# Patient Record
Sex: Male | Born: 1935 | Race: White | Hispanic: No | Marital: Married | Smoking: Never smoker
Health system: Southern US, Community
[De-identification: ages and names within clinical notes are randomized; demographics above are authoritative.]

## PROBLEM LIST (undated history)

## (undated) ENCOUNTER — Emergency Department (HOSPITAL_COMMUNITY): Payer: Medicare Other | Source: Home / Self Care

## (undated) DIAGNOSIS — F32A Depression, unspecified: Secondary | ICD-10-CM

## (undated) DIAGNOSIS — H269 Unspecified cataract: Secondary | ICD-10-CM

## (undated) DIAGNOSIS — G709 Myoneural disorder, unspecified: Secondary | ICD-10-CM

## (undated) DIAGNOSIS — R238 Other skin changes: Secondary | ICD-10-CM

## (undated) DIAGNOSIS — K219 Gastro-esophageal reflux disease without esophagitis: Secondary | ICD-10-CM

## (undated) DIAGNOSIS — I639 Cerebral infarction, unspecified: Secondary | ICD-10-CM

## (undated) DIAGNOSIS — F329 Major depressive disorder, single episode, unspecified: Secondary | ICD-10-CM

## (undated) DIAGNOSIS — E119 Type 2 diabetes mellitus without complications: Secondary | ICD-10-CM

## (undated) DIAGNOSIS — R0902 Hypoxemia: Secondary | ICD-10-CM

## (undated) DIAGNOSIS — I10 Essential (primary) hypertension: Secondary | ICD-10-CM

## (undated) DIAGNOSIS — M1990 Unspecified osteoarthritis, unspecified site: Secondary | ICD-10-CM

## (undated) DIAGNOSIS — R233 Spontaneous ecchymoses: Secondary | ICD-10-CM

## (undated) DIAGNOSIS — F419 Anxiety disorder, unspecified: Secondary | ICD-10-CM

## (undated) HISTORY — DX: Unspecified osteoarthritis, unspecified site: M19.90

## (undated) HISTORY — DX: Anxiety disorder, unspecified: F41.9

## (undated) HISTORY — DX: Cerebral infarction, unspecified: I63.9

## (undated) HISTORY — DX: Unspecified cataract: H26.9

## (undated) HISTORY — PX: TONSILLECTOMY: SUR1361

## (undated) HISTORY — DX: Spontaneous ecchymoses: R23.3

## (undated) HISTORY — PX: CHOLECYSTECTOMY: SHX55

## (undated) HISTORY — DX: Gastro-esophageal reflux disease without esophagitis: K21.9

## (undated) HISTORY — DX: Hypoxemia: R09.02

## (undated) HISTORY — DX: Myoneural disorder, unspecified: G70.9

## (undated) HISTORY — DX: Essential (primary) hypertension: I10

## (undated) HISTORY — DX: Type 2 diabetes mellitus without complications: E11.9

## (undated) HISTORY — DX: Other skin changes: R23.8

## (undated) HISTORY — DX: Depression, unspecified: F32.A

## (undated) HISTORY — DX: Major depressive disorder, single episode, unspecified: F32.9

---

## 2010-10-08 DIAGNOSIS — IMO0002 Reserved for concepts with insufficient information to code with codable children: Secondary | ICD-10-CM | POA: Insufficient documentation

## 2011-02-04 DIAGNOSIS — M19019 Primary osteoarthritis, unspecified shoulder: Secondary | ICD-10-CM | POA: Diagnosis not present

## 2011-02-04 DIAGNOSIS — E119 Type 2 diabetes mellitus without complications: Secondary | ICD-10-CM | POA: Diagnosis not present

## 2011-02-04 DIAGNOSIS — L259 Unspecified contact dermatitis, unspecified cause: Secondary | ICD-10-CM | POA: Diagnosis not present

## 2011-02-04 DIAGNOSIS — E785 Hyperlipidemia, unspecified: Secondary | ICD-10-CM | POA: Diagnosis not present

## 2011-02-04 DIAGNOSIS — I10 Essential (primary) hypertension: Secondary | ICD-10-CM | POA: Diagnosis not present

## 2011-02-18 DIAGNOSIS — M25819 Other specified joint disorders, unspecified shoulder: Secondary | ICD-10-CM | POA: Diagnosis not present

## 2011-03-03 DIAGNOSIS — Z1211 Encounter for screening for malignant neoplasm of colon: Secondary | ICD-10-CM | POA: Diagnosis not present

## 2011-04-01 DIAGNOSIS — Z1211 Encounter for screening for malignant neoplasm of colon: Secondary | ICD-10-CM | POA: Diagnosis not present

## 2011-04-01 DIAGNOSIS — M19019 Primary osteoarthritis, unspecified shoulder: Secondary | ICD-10-CM | POA: Diagnosis not present

## 2011-04-01 DIAGNOSIS — Z7982 Long term (current) use of aspirin: Secondary | ICD-10-CM | POA: Diagnosis not present

## 2011-04-01 DIAGNOSIS — E119 Type 2 diabetes mellitus without complications: Secondary | ICD-10-CM | POA: Diagnosis not present

## 2011-04-01 DIAGNOSIS — I10 Essential (primary) hypertension: Secondary | ICD-10-CM | POA: Diagnosis not present

## 2011-04-01 DIAGNOSIS — D126 Benign neoplasm of colon, unspecified: Secondary | ICD-10-CM | POA: Diagnosis not present

## 2011-04-01 DIAGNOSIS — M1990 Unspecified osteoarthritis, unspecified site: Secondary | ICD-10-CM | POA: Diagnosis not present

## 2011-04-01 DIAGNOSIS — E7800 Pure hypercholesterolemia, unspecified: Secondary | ICD-10-CM | POA: Diagnosis not present

## 2011-04-01 DIAGNOSIS — F411 Generalized anxiety disorder: Secondary | ICD-10-CM | POA: Diagnosis not present

## 2011-04-01 DIAGNOSIS — Z79899 Other long term (current) drug therapy: Secondary | ICD-10-CM | POA: Diagnosis not present

## 2011-04-01 DIAGNOSIS — K648 Other hemorrhoids: Secondary | ICD-10-CM | POA: Diagnosis not present

## 2011-04-01 DIAGNOSIS — K5730 Diverticulosis of large intestine without perforation or abscess without bleeding: Secondary | ICD-10-CM | POA: Diagnosis not present

## 2011-04-01 DIAGNOSIS — K219 Gastro-esophageal reflux disease without esophagitis: Secondary | ICD-10-CM | POA: Diagnosis not present

## 2011-04-08 DIAGNOSIS — M7530 Calcific tendinitis of unspecified shoulder: Secondary | ICD-10-CM | POA: Diagnosis not present

## 2011-04-08 DIAGNOSIS — M67919 Unspecified disorder of synovium and tendon, unspecified shoulder: Secondary | ICD-10-CM | POA: Diagnosis not present

## 2011-04-15 DIAGNOSIS — L820 Inflamed seborrheic keratosis: Secondary | ICD-10-CM | POA: Diagnosis not present

## 2011-05-02 DIAGNOSIS — J209 Acute bronchitis, unspecified: Secondary | ICD-10-CM | POA: Diagnosis not present

## 2011-05-04 DIAGNOSIS — R634 Abnormal weight loss: Secondary | ICD-10-CM | POA: Diagnosis not present

## 2011-05-04 DIAGNOSIS — E119 Type 2 diabetes mellitus without complications: Secondary | ICD-10-CM | POA: Diagnosis not present

## 2011-05-04 DIAGNOSIS — L259 Unspecified contact dermatitis, unspecified cause: Secondary | ICD-10-CM | POA: Diagnosis not present

## 2011-05-04 DIAGNOSIS — F411 Generalized anxiety disorder: Secondary | ICD-10-CM | POA: Diagnosis not present

## 2011-05-04 DIAGNOSIS — K589 Irritable bowel syndrome without diarrhea: Secondary | ICD-10-CM | POA: Diagnosis not present

## 2011-05-20 DIAGNOSIS — F411 Generalized anxiety disorder: Secondary | ICD-10-CM | POA: Diagnosis not present

## 2011-05-20 DIAGNOSIS — R634 Abnormal weight loss: Secondary | ICD-10-CM | POA: Diagnosis not present

## 2011-05-20 DIAGNOSIS — F329 Major depressive disorder, single episode, unspecified: Secondary | ICD-10-CM | POA: Diagnosis not present

## 2011-05-20 DIAGNOSIS — E119 Type 2 diabetes mellitus without complications: Secondary | ICD-10-CM | POA: Diagnosis not present

## 2011-05-27 DIAGNOSIS — E11329 Type 2 diabetes mellitus with mild nonproliferative diabetic retinopathy without macular edema: Secondary | ICD-10-CM | POA: Diagnosis not present

## 2011-05-27 DIAGNOSIS — E119 Type 2 diabetes mellitus without complications: Secondary | ICD-10-CM | POA: Diagnosis not present

## 2011-06-24 DIAGNOSIS — IMO0002 Reserved for concepts with insufficient information to code with codable children: Secondary | ICD-10-CM | POA: Diagnosis not present

## 2011-06-24 DIAGNOSIS — E119 Type 2 diabetes mellitus without complications: Secondary | ICD-10-CM | POA: Diagnosis not present

## 2011-06-24 DIAGNOSIS — R634 Abnormal weight loss: Secondary | ICD-10-CM | POA: Diagnosis not present

## 2011-06-24 DIAGNOSIS — F411 Generalized anxiety disorder: Secondary | ICD-10-CM | POA: Diagnosis not present

## 2011-08-27 DIAGNOSIS — IMO0002 Reserved for concepts with insufficient information to code with codable children: Secondary | ICD-10-CM | POA: Diagnosis not present

## 2011-08-27 DIAGNOSIS — L259 Unspecified contact dermatitis, unspecified cause: Secondary | ICD-10-CM | POA: Diagnosis not present

## 2011-09-29 DIAGNOSIS — M545 Low back pain: Secondary | ICD-10-CM | POA: Diagnosis not present

## 2011-09-29 DIAGNOSIS — M999 Biomechanical lesion, unspecified: Secondary | ICD-10-CM | POA: Diagnosis not present

## 2011-09-29 DIAGNOSIS — IMO0002 Reserved for concepts with insufficient information to code with codable children: Secondary | ICD-10-CM | POA: Diagnosis not present

## 2011-10-01 DIAGNOSIS — M999 Biomechanical lesion, unspecified: Secondary | ICD-10-CM | POA: Diagnosis not present

## 2011-10-01 DIAGNOSIS — M545 Low back pain: Secondary | ICD-10-CM | POA: Diagnosis not present

## 2011-10-01 DIAGNOSIS — IMO0002 Reserved for concepts with insufficient information to code with codable children: Secondary | ICD-10-CM | POA: Diagnosis not present

## 2011-10-28 DIAGNOSIS — E119 Type 2 diabetes mellitus without complications: Secondary | ICD-10-CM | POA: Diagnosis not present

## 2011-10-28 DIAGNOSIS — I10 Essential (primary) hypertension: Secondary | ICD-10-CM | POA: Diagnosis not present

## 2011-10-28 DIAGNOSIS — F411 Generalized anxiety disorder: Secondary | ICD-10-CM | POA: Diagnosis not present

## 2011-10-28 DIAGNOSIS — Z23 Encounter for immunization: Secondary | ICD-10-CM | POA: Diagnosis not present

## 2011-10-28 DIAGNOSIS — Z125 Encounter for screening for malignant neoplasm of prostate: Secondary | ICD-10-CM | POA: Diagnosis not present

## 2011-10-28 DIAGNOSIS — E785 Hyperlipidemia, unspecified: Secondary | ICD-10-CM | POA: Diagnosis not present

## 2011-11-18 DIAGNOSIS — M2040 Other hammer toe(s) (acquired), unspecified foot: Secondary | ICD-10-CM | POA: Diagnosis not present

## 2011-11-18 DIAGNOSIS — E1149 Type 2 diabetes mellitus with other diabetic neurological complication: Secondary | ICD-10-CM | POA: Diagnosis not present

## 2011-11-25 DIAGNOSIS — N289 Disorder of kidney and ureter, unspecified: Secondary | ICD-10-CM | POA: Diagnosis not present

## 2011-11-25 DIAGNOSIS — D649 Anemia, unspecified: Secondary | ICD-10-CM | POA: Diagnosis not present

## 2011-12-23 DIAGNOSIS — L259 Unspecified contact dermatitis, unspecified cause: Secondary | ICD-10-CM | POA: Diagnosis not present

## 2011-12-23 DIAGNOSIS — L570 Actinic keratosis: Secondary | ICD-10-CM | POA: Diagnosis not present

## 2012-01-06 DIAGNOSIS — L259 Unspecified contact dermatitis, unspecified cause: Secondary | ICD-10-CM | POA: Diagnosis not present

## 2012-01-13 DIAGNOSIS — R5381 Other malaise: Secondary | ICD-10-CM | POA: Diagnosis not present

## 2012-01-18 DIAGNOSIS — A419 Sepsis, unspecified organism: Secondary | ICD-10-CM | POA: Diagnosis not present

## 2012-01-18 DIAGNOSIS — R404 Transient alteration of awareness: Secondary | ICD-10-CM | POA: Diagnosis not present

## 2012-01-18 DIAGNOSIS — B9689 Other specified bacterial agents as the cause of diseases classified elsewhere: Secondary | ICD-10-CM | POA: Diagnosis not present

## 2012-01-18 DIAGNOSIS — R509 Fever, unspecified: Secondary | ICD-10-CM | POA: Diagnosis not present

## 2012-01-18 DIAGNOSIS — N390 Urinary tract infection, site not specified: Secondary | ICD-10-CM | POA: Diagnosis not present

## 2012-01-18 DIAGNOSIS — A499 Bacterial infection, unspecified: Secondary | ICD-10-CM | POA: Diagnosis not present

## 2012-01-19 DIAGNOSIS — A499 Bacterial infection, unspecified: Secondary | ICD-10-CM | POA: Diagnosis not present

## 2012-01-19 DIAGNOSIS — Z79899 Other long term (current) drug therapy: Secondary | ICD-10-CM | POA: Diagnosis not present

## 2012-01-19 DIAGNOSIS — R578 Other shock: Secondary | ICD-10-CM | POA: Diagnosis not present

## 2012-01-19 DIAGNOSIS — E872 Acidosis: Secondary | ICD-10-CM | POA: Diagnosis not present

## 2012-01-19 DIAGNOSIS — R300 Dysuria: Secondary | ICD-10-CM | POA: Diagnosis not present

## 2012-01-19 DIAGNOSIS — R6510 Systemic inflammatory response syndrome (SIRS) of non-infectious origin without acute organ dysfunction: Secondary | ICD-10-CM | POA: Diagnosis not present

## 2012-01-19 DIAGNOSIS — R404 Transient alteration of awareness: Secondary | ICD-10-CM | POA: Diagnosis not present

## 2012-01-19 DIAGNOSIS — IMO0001 Reserved for inherently not codable concepts without codable children: Secondary | ICD-10-CM | POA: Diagnosis not present

## 2012-01-19 DIAGNOSIS — N390 Urinary tract infection, site not specified: Secondary | ICD-10-CM | POA: Diagnosis not present

## 2012-01-19 DIAGNOSIS — K219 Gastro-esophageal reflux disease without esophagitis: Secondary | ICD-10-CM | POA: Diagnosis present

## 2012-01-19 DIAGNOSIS — D6489 Other specified anemias: Secondary | ICD-10-CM | POA: Diagnosis present

## 2012-01-19 DIAGNOSIS — E785 Hyperlipidemia, unspecified: Secondary | ICD-10-CM | POA: Diagnosis present

## 2012-01-19 DIAGNOSIS — A419 Sepsis, unspecified organism: Secondary | ICD-10-CM | POA: Diagnosis present

## 2012-01-19 DIAGNOSIS — F411 Generalized anxiety disorder: Secondary | ICD-10-CM | POA: Diagnosis present

## 2012-01-19 DIAGNOSIS — R509 Fever, unspecified: Secondary | ICD-10-CM | POA: Diagnosis not present

## 2012-01-19 DIAGNOSIS — N179 Acute kidney failure, unspecified: Secondary | ICD-10-CM | POA: Diagnosis not present

## 2012-01-19 DIAGNOSIS — I129 Hypertensive chronic kidney disease with stage 1 through stage 4 chronic kidney disease, or unspecified chronic kidney disease: Secondary | ICD-10-CM | POA: Diagnosis present

## 2012-01-21 DIAGNOSIS — IMO0002 Reserved for concepts with insufficient information to code with codable children: Secondary | ICD-10-CM | POA: Diagnosis not present

## 2012-01-21 DIAGNOSIS — J189 Pneumonia, unspecified organism: Secondary | ICD-10-CM | POA: Diagnosis not present

## 2012-01-21 DIAGNOSIS — I501 Left ventricular failure: Secondary | ICD-10-CM | POA: Diagnosis not present

## 2012-01-21 DIAGNOSIS — D500 Iron deficiency anemia secondary to blood loss (chronic): Secondary | ICD-10-CM | POA: Diagnosis present

## 2012-01-21 DIAGNOSIS — K2960 Other gastritis without bleeding: Secondary | ICD-10-CM | POA: Diagnosis present

## 2012-01-21 DIAGNOSIS — K274 Chronic or unspecified peptic ulcer, site unspecified, with hemorrhage: Secondary | ICD-10-CM | POA: Diagnosis not present

## 2012-01-21 DIAGNOSIS — E119 Type 2 diabetes mellitus without complications: Secondary | ICD-10-CM | POA: Diagnosis present

## 2012-01-21 DIAGNOSIS — K922 Gastrointestinal hemorrhage, unspecified: Secondary | ICD-10-CM | POA: Diagnosis not present

## 2012-01-21 DIAGNOSIS — R509 Fever, unspecified: Secondary | ICD-10-CM | POA: Diagnosis not present

## 2012-01-21 DIAGNOSIS — Z79899 Other long term (current) drug therapy: Secondary | ICD-10-CM | POA: Diagnosis not present

## 2012-01-21 DIAGNOSIS — B3781 Candidal esophagitis: Secondary | ICD-10-CM | POA: Diagnosis not present

## 2012-01-21 DIAGNOSIS — R011 Cardiac murmur, unspecified: Secondary | ICD-10-CM | POA: Diagnosis not present

## 2012-01-21 DIAGNOSIS — I2789 Other specified pulmonary heart diseases: Secondary | ICD-10-CM | POA: Diagnosis not present

## 2012-01-21 DIAGNOSIS — R0602 Shortness of breath: Secondary | ICD-10-CM | POA: Diagnosis not present

## 2012-01-21 DIAGNOSIS — R748 Abnormal levels of other serum enzymes: Secondary | ICD-10-CM | POA: Diagnosis not present

## 2012-01-21 DIAGNOSIS — E860 Dehydration: Secondary | ICD-10-CM | POA: Diagnosis not present

## 2012-01-21 DIAGNOSIS — E872 Acidosis, unspecified: Secondary | ICD-10-CM | POA: Diagnosis not present

## 2012-01-21 DIAGNOSIS — R9431 Abnormal electrocardiogram [ECG] [EKG]: Secondary | ICD-10-CM | POA: Diagnosis not present

## 2012-01-21 DIAGNOSIS — N179 Acute kidney failure, unspecified: Secondary | ICD-10-CM | POA: Diagnosis not present

## 2012-01-21 DIAGNOSIS — E785 Hyperlipidemia, unspecified: Secondary | ICD-10-CM | POA: Diagnosis present

## 2012-01-21 DIAGNOSIS — D1330 Benign neoplasm of unspecified part of small intestine: Secondary | ICD-10-CM | POA: Diagnosis not present

## 2012-01-21 DIAGNOSIS — D649 Anemia, unspecified: Secondary | ICD-10-CM | POA: Diagnosis not present

## 2012-01-21 DIAGNOSIS — R0789 Other chest pain: Secondary | ICD-10-CM | POA: Diagnosis not present

## 2012-01-21 DIAGNOSIS — R0902 Hypoxemia: Secondary | ICD-10-CM | POA: Diagnosis not present

## 2012-01-21 DIAGNOSIS — D62 Acute posthemorrhagic anemia: Secondary | ICD-10-CM | POA: Diagnosis not present

## 2012-01-21 DIAGNOSIS — Z7982 Long term (current) use of aspirin: Secondary | ICD-10-CM | POA: Diagnosis not present

## 2012-01-21 DIAGNOSIS — A419 Sepsis, unspecified organism: Secondary | ICD-10-CM | POA: Diagnosis not present

## 2012-01-21 DIAGNOSIS — J984 Other disorders of lung: Secondary | ICD-10-CM | POA: Diagnosis not present

## 2012-01-21 DIAGNOSIS — R6510 Systemic inflammatory response syndrome (SIRS) of non-infectious origin without acute organ dysfunction: Secondary | ICD-10-CM | POA: Diagnosis not present

## 2012-01-21 DIAGNOSIS — R5381 Other malaise: Secondary | ICD-10-CM | POA: Diagnosis not present

## 2012-01-21 DIAGNOSIS — J9600 Acute respiratory failure, unspecified whether with hypoxia or hypercapnia: Secondary | ICD-10-CM | POA: Diagnosis not present

## 2012-01-21 DIAGNOSIS — F411 Generalized anxiety disorder: Secondary | ICD-10-CM | POA: Diagnosis present

## 2012-01-21 DIAGNOSIS — I10 Essential (primary) hypertension: Secondary | ICD-10-CM | POA: Diagnosis present

## 2012-01-21 DIAGNOSIS — K219 Gastro-esophageal reflux disease without esophagitis: Secondary | ICD-10-CM | POA: Diagnosis not present

## 2012-01-21 DIAGNOSIS — I509 Heart failure, unspecified: Secondary | ICD-10-CM | POA: Diagnosis present

## 2012-01-21 DIAGNOSIS — I5030 Unspecified diastolic (congestive) heart failure: Secondary | ICD-10-CM | POA: Diagnosis not present

## 2012-02-04 DIAGNOSIS — Z8719 Personal history of other diseases of the digestive system: Secondary | ICD-10-CM | POA: Diagnosis not present

## 2012-02-04 DIAGNOSIS — Z09 Encounter for follow-up examination after completed treatment for conditions other than malignant neoplasm: Secondary | ICD-10-CM | POA: Diagnosis not present

## 2012-02-08 DIAGNOSIS — I10 Essential (primary) hypertension: Secondary | ICD-10-CM | POA: Diagnosis not present

## 2012-02-08 DIAGNOSIS — E119 Type 2 diabetes mellitus without complications: Secondary | ICD-10-CM | POA: Diagnosis not present

## 2012-02-08 DIAGNOSIS — R7989 Other specified abnormal findings of blood chemistry: Secondary | ICD-10-CM | POA: Diagnosis not present

## 2012-02-08 DIAGNOSIS — E785 Hyperlipidemia, unspecified: Secondary | ICD-10-CM | POA: Diagnosis not present

## 2012-02-09 DIAGNOSIS — B3781 Candidal esophagitis: Secondary | ICD-10-CM | POA: Diagnosis not present

## 2012-02-09 DIAGNOSIS — K219 Gastro-esophageal reflux disease without esophagitis: Secondary | ICD-10-CM | POA: Diagnosis not present

## 2012-03-03 DIAGNOSIS — L259 Unspecified contact dermatitis, unspecified cause: Secondary | ICD-10-CM | POA: Diagnosis not present

## 2012-03-23 DIAGNOSIS — L259 Unspecified contact dermatitis, unspecified cause: Secondary | ICD-10-CM | POA: Diagnosis not present

## 2012-03-23 DIAGNOSIS — L981 Factitial dermatitis: Secondary | ICD-10-CM | POA: Diagnosis not present

## 2012-04-20 DIAGNOSIS — E119 Type 2 diabetes mellitus without complications: Secondary | ICD-10-CM | POA: Diagnosis not present

## 2012-04-20 DIAGNOSIS — I10 Essential (primary) hypertension: Secondary | ICD-10-CM | POA: Diagnosis not present

## 2012-04-20 DIAGNOSIS — R7989 Other specified abnormal findings of blood chemistry: Secondary | ICD-10-CM | POA: Diagnosis not present

## 2012-04-20 DIAGNOSIS — E785 Hyperlipidemia, unspecified: Secondary | ICD-10-CM | POA: Diagnosis not present

## 2012-05-04 DIAGNOSIS — Z1331 Encounter for screening for depression: Secondary | ICD-10-CM | POA: Diagnosis not present

## 2012-05-04 DIAGNOSIS — E785 Hyperlipidemia, unspecified: Secondary | ICD-10-CM | POA: Diagnosis not present

## 2012-05-04 DIAGNOSIS — E119 Type 2 diabetes mellitus without complications: Secondary | ICD-10-CM | POA: Diagnosis not present

## 2012-05-04 DIAGNOSIS — Z9181 History of falling: Secondary | ICD-10-CM | POA: Diagnosis not present

## 2012-05-04 DIAGNOSIS — IMO0002 Reserved for concepts with insufficient information to code with codable children: Secondary | ICD-10-CM | POA: Diagnosis not present

## 2012-05-04 DIAGNOSIS — I10 Essential (primary) hypertension: Secondary | ICD-10-CM | POA: Diagnosis not present

## 2012-05-18 DIAGNOSIS — L259 Unspecified contact dermatitis, unspecified cause: Secondary | ICD-10-CM | POA: Diagnosis not present

## 2012-05-18 DIAGNOSIS — L508 Other urticaria: Secondary | ICD-10-CM | POA: Diagnosis not present

## 2012-06-08 DIAGNOSIS — J309 Allergic rhinitis, unspecified: Secondary | ICD-10-CM | POA: Diagnosis not present

## 2012-06-08 DIAGNOSIS — M545 Low back pain: Secondary | ICD-10-CM | POA: Diagnosis not present

## 2012-06-08 DIAGNOSIS — E119 Type 2 diabetes mellitus without complications: Secondary | ICD-10-CM | POA: Diagnosis not present

## 2012-06-08 DIAGNOSIS — L2089 Other atopic dermatitis: Secondary | ICD-10-CM | POA: Diagnosis not present

## 2012-06-08 DIAGNOSIS — IMO0002 Reserved for concepts with insufficient information to code with codable children: Secondary | ICD-10-CM | POA: Diagnosis not present

## 2012-06-08 DIAGNOSIS — M999 Biomechanical lesion, unspecified: Secondary | ICD-10-CM | POA: Diagnosis not present

## 2012-06-09 DIAGNOSIS — M999 Biomechanical lesion, unspecified: Secondary | ICD-10-CM | POA: Diagnosis not present

## 2012-06-09 DIAGNOSIS — M545 Low back pain: Secondary | ICD-10-CM | POA: Diagnosis not present

## 2012-06-09 DIAGNOSIS — IMO0002 Reserved for concepts with insufficient information to code with codable children: Secondary | ICD-10-CM | POA: Diagnosis not present

## 2012-06-10 DIAGNOSIS — M545 Low back pain: Secondary | ICD-10-CM | POA: Diagnosis not present

## 2012-06-10 DIAGNOSIS — IMO0002 Reserved for concepts with insufficient information to code with codable children: Secondary | ICD-10-CM | POA: Diagnosis not present

## 2012-06-10 DIAGNOSIS — M999 Biomechanical lesion, unspecified: Secondary | ICD-10-CM | POA: Diagnosis not present

## 2012-06-13 DIAGNOSIS — M999 Biomechanical lesion, unspecified: Secondary | ICD-10-CM | POA: Diagnosis not present

## 2012-06-13 DIAGNOSIS — M545 Low back pain: Secondary | ICD-10-CM | POA: Diagnosis not present

## 2012-06-13 DIAGNOSIS — IMO0002 Reserved for concepts with insufficient information to code with codable children: Secondary | ICD-10-CM | POA: Diagnosis not present

## 2012-06-16 DIAGNOSIS — M545 Low back pain: Secondary | ICD-10-CM | POA: Diagnosis not present

## 2012-06-16 DIAGNOSIS — Z79899 Other long term (current) drug therapy: Secondary | ICD-10-CM | POA: Diagnosis not present

## 2012-06-21 DIAGNOSIS — M545 Low back pain: Secondary | ICD-10-CM | POA: Diagnosis not present

## 2012-06-21 DIAGNOSIS — IMO0002 Reserved for concepts with insufficient information to code with codable children: Secondary | ICD-10-CM | POA: Diagnosis not present

## 2012-06-21 DIAGNOSIS — M999 Biomechanical lesion, unspecified: Secondary | ICD-10-CM | POA: Diagnosis not present

## 2012-06-29 DIAGNOSIS — IMO0002 Reserved for concepts with insufficient information to code with codable children: Secondary | ICD-10-CM | POA: Diagnosis not present

## 2012-06-29 DIAGNOSIS — M545 Low back pain: Secondary | ICD-10-CM | POA: Diagnosis not present

## 2012-06-29 DIAGNOSIS — M999 Biomechanical lesion, unspecified: Secondary | ICD-10-CM | POA: Diagnosis not present

## 2012-06-29 DIAGNOSIS — J45909 Unspecified asthma, uncomplicated: Secondary | ICD-10-CM | POA: Diagnosis not present

## 2012-06-30 DIAGNOSIS — J45909 Unspecified asthma, uncomplicated: Secondary | ICD-10-CM | POA: Diagnosis not present

## 2012-07-08 DIAGNOSIS — M5136 Other intervertebral disc degeneration, lumbar region: Secondary | ICD-10-CM | POA: Insufficient documentation

## 2012-07-08 DIAGNOSIS — M51369 Other intervertebral disc degeneration, lumbar region without mention of lumbar back pain or lower extremity pain: Secondary | ICD-10-CM | POA: Insufficient documentation

## 2012-07-08 DIAGNOSIS — IMO0002 Reserved for concepts with insufficient information to code with codable children: Secondary | ICD-10-CM | POA: Diagnosis not present

## 2012-07-08 DIAGNOSIS — M5137 Other intervertebral disc degeneration, lumbosacral region: Secondary | ICD-10-CM | POA: Diagnosis not present

## 2012-07-13 DIAGNOSIS — M5137 Other intervertebral disc degeneration, lumbosacral region: Secondary | ICD-10-CM | POA: Diagnosis not present

## 2012-07-13 DIAGNOSIS — IMO0002 Reserved for concepts with insufficient information to code with codable children: Secondary | ICD-10-CM | POA: Diagnosis not present

## 2012-07-13 DIAGNOSIS — M5126 Other intervertebral disc displacement, lumbar region: Secondary | ICD-10-CM | POA: Diagnosis not present

## 2012-07-27 DIAGNOSIS — L509 Urticaria, unspecified: Secondary | ICD-10-CM | POA: Diagnosis not present

## 2012-07-28 DIAGNOSIS — L509 Urticaria, unspecified: Secondary | ICD-10-CM | POA: Diagnosis not present

## 2012-08-24 DIAGNOSIS — J45909 Unspecified asthma, uncomplicated: Secondary | ICD-10-CM | POA: Diagnosis not present

## 2012-08-25 DIAGNOSIS — J45909 Unspecified asthma, uncomplicated: Secondary | ICD-10-CM | POA: Diagnosis not present

## 2012-09-07 DIAGNOSIS — E785 Hyperlipidemia, unspecified: Secondary | ICD-10-CM | POA: Diagnosis not present

## 2012-09-07 DIAGNOSIS — I10 Essential (primary) hypertension: Secondary | ICD-10-CM | POA: Diagnosis not present

## 2012-09-07 DIAGNOSIS — E119 Type 2 diabetes mellitus without complications: Secondary | ICD-10-CM | POA: Diagnosis not present

## 2012-09-07 DIAGNOSIS — F411 Generalized anxiety disorder: Secondary | ICD-10-CM | POA: Diagnosis not present

## 2012-09-13 DIAGNOSIS — IMO0002 Reserved for concepts with insufficient information to code with codable children: Secondary | ICD-10-CM | POA: Diagnosis not present

## 2012-09-13 DIAGNOSIS — M999 Biomechanical lesion, unspecified: Secondary | ICD-10-CM | POA: Diagnosis not present

## 2012-09-13 DIAGNOSIS — M545 Low back pain: Secondary | ICD-10-CM | POA: Diagnosis not present

## 2012-09-15 DIAGNOSIS — Z79899 Other long term (current) drug therapy: Secondary | ICD-10-CM | POA: Diagnosis not present

## 2012-09-15 DIAGNOSIS — E785 Hyperlipidemia, unspecified: Secondary | ICD-10-CM | POA: Diagnosis not present

## 2012-09-21 DIAGNOSIS — L500 Allergic urticaria: Secondary | ICD-10-CM | POA: Diagnosis not present

## 2012-09-21 DIAGNOSIS — L2089 Other atopic dermatitis: Secondary | ICD-10-CM | POA: Diagnosis not present

## 2012-09-22 DIAGNOSIS — L2089 Other atopic dermatitis: Secondary | ICD-10-CM | POA: Diagnosis not present

## 2012-09-22 DIAGNOSIS — L500 Allergic urticaria: Secondary | ICD-10-CM | POA: Diagnosis not present

## 2012-10-26 DIAGNOSIS — L500 Allergic urticaria: Secondary | ICD-10-CM | POA: Diagnosis not present

## 2012-10-27 DIAGNOSIS — L500 Allergic urticaria: Secondary | ICD-10-CM | POA: Diagnosis not present

## 2012-11-23 DIAGNOSIS — L509 Urticaria, unspecified: Secondary | ICD-10-CM | POA: Diagnosis not present

## 2012-11-24 DIAGNOSIS — L509 Urticaria, unspecified: Secondary | ICD-10-CM | POA: Diagnosis not present

## 2012-12-14 DIAGNOSIS — I10 Essential (primary) hypertension: Secondary | ICD-10-CM | POA: Diagnosis not present

## 2012-12-14 DIAGNOSIS — E785 Hyperlipidemia, unspecified: Secondary | ICD-10-CM | POA: Diagnosis not present

## 2012-12-14 DIAGNOSIS — F411 Generalized anxiety disorder: Secondary | ICD-10-CM | POA: Diagnosis not present

## 2012-12-14 DIAGNOSIS — E119 Type 2 diabetes mellitus without complications: Secondary | ICD-10-CM | POA: Diagnosis not present

## 2012-12-22 DIAGNOSIS — J45909 Unspecified asthma, uncomplicated: Secondary | ICD-10-CM | POA: Diagnosis not present

## 2012-12-26 DIAGNOSIS — J45909 Unspecified asthma, uncomplicated: Secondary | ICD-10-CM | POA: Diagnosis not present

## 2013-01-04 DIAGNOSIS — Z23 Encounter for immunization: Secondary | ICD-10-CM | POA: Diagnosis not present

## 2013-01-09 DIAGNOSIS — M999 Biomechanical lesion, unspecified: Secondary | ICD-10-CM | POA: Diagnosis not present

## 2013-01-09 DIAGNOSIS — IMO0002 Reserved for concepts with insufficient information to code with codable children: Secondary | ICD-10-CM | POA: Diagnosis not present

## 2013-01-09 DIAGNOSIS — M545 Low back pain: Secondary | ICD-10-CM | POA: Diagnosis not present

## 2013-01-10 DIAGNOSIS — IMO0002 Reserved for concepts with insufficient information to code with codable children: Secondary | ICD-10-CM | POA: Diagnosis not present

## 2013-01-10 DIAGNOSIS — M545 Low back pain: Secondary | ICD-10-CM | POA: Diagnosis not present

## 2013-01-10 DIAGNOSIS — M999 Biomechanical lesion, unspecified: Secondary | ICD-10-CM | POA: Diagnosis not present

## 2013-01-11 DIAGNOSIS — M999 Biomechanical lesion, unspecified: Secondary | ICD-10-CM | POA: Diagnosis not present

## 2013-01-11 DIAGNOSIS — IMO0002 Reserved for concepts with insufficient information to code with codable children: Secondary | ICD-10-CM | POA: Diagnosis not present

## 2013-01-11 DIAGNOSIS — M545 Low back pain: Secondary | ICD-10-CM | POA: Diagnosis not present

## 2013-01-12 DIAGNOSIS — M999 Biomechanical lesion, unspecified: Secondary | ICD-10-CM | POA: Diagnosis not present

## 2013-01-12 DIAGNOSIS — M545 Low back pain: Secondary | ICD-10-CM | POA: Diagnosis not present

## 2013-01-12 DIAGNOSIS — IMO0002 Reserved for concepts with insufficient information to code with codable children: Secondary | ICD-10-CM | POA: Diagnosis not present

## 2013-01-13 DIAGNOSIS — M999 Biomechanical lesion, unspecified: Secondary | ICD-10-CM | POA: Diagnosis not present

## 2013-01-13 DIAGNOSIS — IMO0002 Reserved for concepts with insufficient information to code with codable children: Secondary | ICD-10-CM | POA: Diagnosis not present

## 2013-01-13 DIAGNOSIS — M545 Low back pain: Secondary | ICD-10-CM | POA: Diagnosis not present

## 2013-01-16 DIAGNOSIS — M999 Biomechanical lesion, unspecified: Secondary | ICD-10-CM | POA: Diagnosis not present

## 2013-01-16 DIAGNOSIS — IMO0002 Reserved for concepts with insufficient information to code with codable children: Secondary | ICD-10-CM | POA: Diagnosis not present

## 2013-01-16 DIAGNOSIS — M545 Low back pain: Secondary | ICD-10-CM | POA: Diagnosis not present

## 2013-01-18 ENCOUNTER — Ambulatory Visit (INDEPENDENT_AMBULATORY_CARE_PROVIDER_SITE_OTHER): Payer: Medicare Other

## 2013-01-18 VITALS — BP 152/89 | HR 58 | Resp 18

## 2013-01-18 DIAGNOSIS — E1140 Type 2 diabetes mellitus with diabetic neuropathy, unspecified: Secondary | ICD-10-CM

## 2013-01-18 DIAGNOSIS — E1149 Type 2 diabetes mellitus with other diabetic neurological complication: Secondary | ICD-10-CM

## 2013-01-18 DIAGNOSIS — E1142 Type 2 diabetes mellitus with diabetic polyneuropathy: Secondary | ICD-10-CM

## 2013-01-18 DIAGNOSIS — L500 Allergic urticaria: Secondary | ICD-10-CM | POA: Diagnosis not present

## 2013-01-18 DIAGNOSIS — L2089 Other atopic dermatitis: Secondary | ICD-10-CM | POA: Diagnosis not present

## 2013-01-18 DIAGNOSIS — C4442 Squamous cell carcinoma of skin of scalp and neck: Secondary | ICD-10-CM | POA: Diagnosis not present

## 2013-01-18 NOTE — Patient Instructions (Signed)
Diabetes and Foot Care Diabetes may cause you to have problems because of poor blood supply (circulation) to your feet and legs. This may cause the skin on your feet to become thinner, break easier, and heal more slowly. Your skin may become dry, and the skin may peel and crack. You may also have nerve damage in your legs and feet causing decreased feeling in them. You may not notice minor injuries to your feet that could lead to infections or more serious problems. Taking care of your feet is one of the most important things you can do for yourself.  HOME CARE INSTRUCTIONS  Wear shoes at all times, even in the house. Do not go barefoot. Bare feet are easily injured.  Check your feet daily for blisters, cuts, and redness. If you cannot see the bottom of your feet, use a mirror or ask someone for help.  Wash your feet with warm water (do not use hot water) and mild soap. Then pat your feet and the areas between your toes until they are completely dry. Do not soak your feet as this can dry your skin.  Apply a moisturizing lotion or petroleum jelly (that does not contain alcohol and is unscented) to the skin on your feet and to dry, brittle toenails. Do not apply lotion between your toes.  Trim your toenails straight across. Do not dig under them or around the cuticle. File the edges of your nails with an emery board or nail file.  Do not cut corns or calluses or try to remove them with medicine.  Wear clean socks or stockings every day. Make sure they are not too tight. Do not wear knee-high stockings since they may decrease blood flow to your legs.  Wear shoes that fit properly and have enough cushioning. To break in new shoes, wear them for just a few hours a day. This prevents you from injuring your feet. Always look in your shoes before you put them on to be sure there are no objects inside.  Do not cross your legs. This may decrease the blood flow to your feet.  If you find a minor scrape,  cut, or break in the skin on your feet, keep it and the skin around it clean and dry. These areas may be cleansed with mild soap and water. Do not cleanse the area with peroxide, alcohol, or iodine.  When you remove an adhesive bandage, be sure not to damage the skin around it.  If you have a wound, look at it several times a day to make sure it is healing.  Do not use heating pads or hot water bottles. They may burn your skin. If you have lost feeling in your feet or legs, you may not know it is happening until it is too late.  Make sure your health care provider performs a complete foot exam at least annually or more often if you have foot problems. Report any cuts, sores, or bruises to your health care provider immediately. SEEK MEDICAL CARE IF:   You have an injury that is not healing.  You have cuts or breaks in the skin.  You have an ingrown nail.  You notice redness on your legs or feet.  You feel burning or tingling in your legs or feet.  You have pain or cramps in your legs and feet.  Your legs or feet are numb.  Your feet always feel cold. SEEK IMMEDIATE MEDICAL CARE IF:   There is increasing redness,   swelling, or pain in or around a wound.  There is a red line that goes up your leg.  Pus is coming from a wound.  You develop a fever or as directed by your health care provider.  You notice a bad smell coming from an ulcer or wound. Document Released: 01/17/2000 Document Revised: 09/21/2012 Document Reviewed: 06/28/2012 ExitCare Patient Information 2014 [employer].  

## 2013-01-18 NOTE — Progress Notes (Signed)
° °  Subjective:    Patient ID: Evan Vaughan, male    DOB: 06-Sep-1934, 78 y.o.   MRN: [id number]  HPI I just want my feet checked out and make sure everything is ok    Review of Systems  Constitutional: Negative.   HENT: Negative.   Eyes: Negative.   Respiratory: Negative.   Cardiovascular: Negative.   Gastrointestinal: Negative.   Endocrine: Positive for cold intolerance.  Genitourinary: Negative.   Musculoskeletal: Positive for back pain.  Skin: Positive for rash.  Allergic/Immunologic: Negative.   Neurological: Negative.   Hematological: Bruises/bleeds easily.  Psychiatric/Behavioral: Negative.        Objective:   Physical Exam Neurovascular status is intact although somewhat diminished has absent sensation plantar aspects of the feet also some decreased pulses posterior tibial bilateral. Refer to diabetic foot exam and evaluation for more detail. Orthopedic biomechanical exam reveals rectus foot type mild flexible digits with digital contractures hammertoes noted although not rigid no open wounds or ulcerations or identified no other new complications. Patient wearing appropriate diabetic shoes aren't or athletic type Oxford shoes no history of ulcers review problems noted       Assessment & Plan:  Assessment this time his diabetes with mild peripheral neuropathy early stages noted. Intact blood flow although does have some weakness and posterior tibial pulse palpation. Suggested followup in 6-12 months for reevaluation contact us if any changes occur any new symptoms occur at any time. Maintain appropriate shoe wear diabetic foot care instructions dispensed at this time.  Alvan Dame DPM

## 2013-01-19 DIAGNOSIS — IMO0002 Reserved for concepts with insufficient information to code with codable children: Secondary | ICD-10-CM | POA: Diagnosis not present

## 2013-01-19 DIAGNOSIS — M545 Low back pain: Secondary | ICD-10-CM | POA: Diagnosis not present

## 2013-01-19 DIAGNOSIS — M999 Biomechanical lesion, unspecified: Secondary | ICD-10-CM | POA: Diagnosis not present

## 2013-02-08 DIAGNOSIS — R059 Cough, unspecified: Secondary | ICD-10-CM | POA: Diagnosis not present

## 2013-02-08 DIAGNOSIS — J069 Acute upper respiratory infection, unspecified: Secondary | ICD-10-CM | POA: Diagnosis not present

## 2013-02-08 DIAGNOSIS — R05 Cough: Secondary | ICD-10-CM | POA: Diagnosis not present

## 2013-03-24 DIAGNOSIS — E785 Hyperlipidemia, unspecified: Secondary | ICD-10-CM | POA: Diagnosis not present

## 2013-03-24 DIAGNOSIS — I10 Essential (primary) hypertension: Secondary | ICD-10-CM | POA: Diagnosis not present

## 2013-03-24 DIAGNOSIS — F329 Major depressive disorder, single episode, unspecified: Secondary | ICD-10-CM | POA: Diagnosis not present

## 2013-03-24 DIAGNOSIS — E119 Type 2 diabetes mellitus without complications: Secondary | ICD-10-CM | POA: Diagnosis not present

## 2013-03-28 DIAGNOSIS — IMO0002 Reserved for concepts with insufficient information to code with codable children: Secondary | ICD-10-CM | POA: Diagnosis not present

## 2013-03-28 DIAGNOSIS — M545 Low back pain, unspecified: Secondary | ICD-10-CM | POA: Diagnosis not present

## 2013-03-28 DIAGNOSIS — M999 Biomechanical lesion, unspecified: Secondary | ICD-10-CM | POA: Diagnosis not present

## 2013-03-29 DIAGNOSIS — L259 Unspecified contact dermatitis, unspecified cause: Secondary | ICD-10-CM | POA: Diagnosis not present

## 2013-03-29 DIAGNOSIS — L508 Other urticaria: Secondary | ICD-10-CM | POA: Diagnosis not present

## 2013-04-05 DIAGNOSIS — L408 Other psoriasis: Secondary | ICD-10-CM | POA: Diagnosis not present

## 2013-04-06 ENCOUNTER — Telehealth: Payer: Self-pay | Admitting: *Deleted

## 2013-04-06 NOTE — Telephone Encounter (Signed)
Yes to provide inserts orthotics they may be helpful for his feet, however there still not covered by Medicare. Medicare does not pay for any types of shoe insoles orthotics. Except for diabetic patient's .  We have inserts that are prefabricated that could be as little as $35 a pair versus custom-made orthotics or arch supports neck and cost is much is $360 or more a pair however the usual maximum that whenever charge patient is $275 for custom orthotics when insurance does not cover them. Next  Will be happy to see him for followup appointment in further discussion  Harriet Masson DPM

## 2013-04-12 DIAGNOSIS — L508 Other urticaria: Secondary | ICD-10-CM | POA: Diagnosis not present

## 2013-04-12 DIAGNOSIS — L259 Unspecified contact dermatitis, unspecified cause: Secondary | ICD-10-CM | POA: Diagnosis not present

## 2013-04-19 DIAGNOSIS — E119 Type 2 diabetes mellitus without complications: Secondary | ICD-10-CM

## 2013-04-26 DIAGNOSIS — L408 Other psoriasis: Secondary | ICD-10-CM | POA: Diagnosis not present

## 2013-05-03 DIAGNOSIS — L259 Unspecified contact dermatitis, unspecified cause: Secondary | ICD-10-CM | POA: Diagnosis not present

## 2013-05-03 DIAGNOSIS — L981 Factitial dermatitis: Secondary | ICD-10-CM | POA: Diagnosis not present

## 2013-06-14 DIAGNOSIS — L408 Other psoriasis: Secondary | ICD-10-CM | POA: Diagnosis not present

## 2013-06-14 DIAGNOSIS — L508 Other urticaria: Secondary | ICD-10-CM | POA: Diagnosis not present

## 2013-06-14 DIAGNOSIS — L259 Unspecified contact dermatitis, unspecified cause: Secondary | ICD-10-CM | POA: Diagnosis not present

## 2013-06-21 DIAGNOSIS — L408 Other psoriasis: Secondary | ICD-10-CM | POA: Diagnosis not present

## 2013-06-28 DIAGNOSIS — E119 Type 2 diabetes mellitus without complications: Secondary | ICD-10-CM | POA: Diagnosis not present

## 2013-07-17 DIAGNOSIS — L408 Other psoriasis: Secondary | ICD-10-CM | POA: Diagnosis not present

## 2013-07-25 DIAGNOSIS — L508 Other urticaria: Secondary | ICD-10-CM | POA: Diagnosis not present

## 2013-07-26 DIAGNOSIS — I10 Essential (primary) hypertension: Secondary | ICD-10-CM | POA: Diagnosis not present

## 2013-07-26 DIAGNOSIS — F411 Generalized anxiety disorder: Secondary | ICD-10-CM | POA: Diagnosis not present

## 2013-07-26 DIAGNOSIS — Z1331 Encounter for screening for depression: Secondary | ICD-10-CM | POA: Diagnosis not present

## 2013-07-26 DIAGNOSIS — E119 Type 2 diabetes mellitus without complications: Secondary | ICD-10-CM | POA: Diagnosis not present

## 2013-07-26 DIAGNOSIS — E785 Hyperlipidemia, unspecified: Secondary | ICD-10-CM | POA: Diagnosis not present

## 2013-07-26 DIAGNOSIS — Z9181 History of falling: Secondary | ICD-10-CM | POA: Diagnosis not present

## 2013-08-01 DIAGNOSIS — E875 Hyperkalemia: Secondary | ICD-10-CM | POA: Diagnosis not present

## 2013-11-22 DIAGNOSIS — F329 Major depressive disorder, single episode, unspecified: Secondary | ICD-10-CM | POA: Diagnosis not present

## 2013-11-22 DIAGNOSIS — F411 Generalized anxiety disorder: Secondary | ICD-10-CM | POA: Diagnosis not present

## 2013-11-22 DIAGNOSIS — E785 Hyperlipidemia, unspecified: Secondary | ICD-10-CM | POA: Diagnosis not present

## 2013-11-22 DIAGNOSIS — Z79899 Other long term (current) drug therapy: Secondary | ICD-10-CM | POA: Diagnosis not present

## 2013-11-22 DIAGNOSIS — I10 Essential (primary) hypertension: Secondary | ICD-10-CM | POA: Diagnosis not present

## 2013-11-22 DIAGNOSIS — Z23 Encounter for immunization: Secondary | ICD-10-CM | POA: Diagnosis not present

## 2013-11-22 DIAGNOSIS — E1165 Type 2 diabetes mellitus with hyperglycemia: Secondary | ICD-10-CM | POA: Diagnosis not present

## 2013-11-24 DIAGNOSIS — M9904 Segmental and somatic dysfunction of sacral region: Secondary | ICD-10-CM | POA: Diagnosis not present

## 2013-11-24 DIAGNOSIS — M9903 Segmental and somatic dysfunction of lumbar region: Secondary | ICD-10-CM | POA: Diagnosis not present

## 2013-11-24 DIAGNOSIS — M5441 Lumbago with sciatica, right side: Secondary | ICD-10-CM | POA: Diagnosis not present

## 2013-11-24 DIAGNOSIS — M5136 Other intervertebral disc degeneration, lumbar region: Secondary | ICD-10-CM | POA: Diagnosis not present

## 2013-12-11 DIAGNOSIS — M9904 Segmental and somatic dysfunction of sacral region: Secondary | ICD-10-CM | POA: Diagnosis not present

## 2013-12-11 DIAGNOSIS — M5441 Lumbago with sciatica, right side: Secondary | ICD-10-CM | POA: Diagnosis not present

## 2013-12-11 DIAGNOSIS — M5136 Other intervertebral disc degeneration, lumbar region: Secondary | ICD-10-CM | POA: Diagnosis not present

## 2013-12-11 DIAGNOSIS — M9903 Segmental and somatic dysfunction of lumbar region: Secondary | ICD-10-CM | POA: Diagnosis not present

## 2014-01-05 DIAGNOSIS — M5136 Other intervertebral disc degeneration, lumbar region: Secondary | ICD-10-CM | POA: Diagnosis not present

## 2014-01-05 DIAGNOSIS — M9903 Segmental and somatic dysfunction of lumbar region: Secondary | ICD-10-CM | POA: Diagnosis not present

## 2014-01-05 DIAGNOSIS — M5441 Lumbago with sciatica, right side: Secondary | ICD-10-CM | POA: Diagnosis not present

## 2014-01-05 DIAGNOSIS — M9904 Segmental and somatic dysfunction of sacral region: Secondary | ICD-10-CM | POA: Diagnosis not present

## 2014-01-19 ENCOUNTER — Ambulatory Visit (INDEPENDENT_AMBULATORY_CARE_PROVIDER_SITE_OTHER): Payer: Medicare Other

## 2014-01-19 DIAGNOSIS — L603 Nail dystrophy: Secondary | ICD-10-CM

## 2014-01-19 DIAGNOSIS — E1140 Type 2 diabetes mellitus with diabetic neuropathy, unspecified: Secondary | ICD-10-CM | POA: Diagnosis not present

## 2014-01-19 NOTE — Patient Instructions (Signed)
Diabetes and Foot Care Diabetes may cause you to have problems because of poor blood supply (circulation) to your feet and legs. This may cause the skin on your feet to become thinner, break easier, and heal more slowly. Your skin may become dry, and the skin may peel and crack. You may also have nerve damage in your legs and feet causing decreased feeling in them. You may not notice minor injuries to your feet that could lead to infections or more serious problems. Taking care of your feet is one of the most important things you can do for yourself.  HOME CARE INSTRUCTIONS  Wear shoes at all times, even in the house. Do not go barefoot. Bare feet are easily injured.  Check your feet daily for blisters, cuts, and redness. If you cannot see the bottom of your feet, use a mirror or ask someone for help.  Wash your feet with warm water (do not use hot water) and mild soap. Then pat your feet and the areas between your toes until they are completely dry. Do not soak your feet as this can dry your skin.  Apply a moisturizing lotion or petroleum jelly (that does not contain alcohol and is unscented) to the skin on your feet and to dry, brittle toenails. Do not apply lotion between your toes.  Trim your toenails straight across. Do not dig under them or around the cuticle. File the edges of your nails with an emery board or nail file.  Do not cut corns or calluses or try to remove them with medicine.  Wear clean socks or stockings every day. Make sure they are not too tight. Do not wear knee-high stockings since they may decrease blood flow to your legs.  Wear shoes that fit properly and have enough cushioning. To break in new shoes, wear them for just a few hours a day. This prevents you from injuring your feet. Always look in your shoes before you put them on to be sure there are no objects inside.  Do not cross your legs. This may decrease the blood flow to your feet.  If you find a minor scrape,  cut, or break in the skin on your feet, keep it and the skin around it clean and dry. These areas may be cleansed with mild soap and water. Do not cleanse the area with peroxide, alcohol, or iodine.  When you remove an adhesive bandage, be sure not to damage the skin around it.  If you have a wound, look at it several times a day to make sure it is healing.  Do not use heating pads or hot water bottles. They may burn your skin. If you have lost feeling in your feet or legs, you may not know it is happening until it is too late.  Make sure your health care provider performs a complete foot exam at least annually or more often if you have foot problems. Report any cuts, sores, or bruises to your health care provider immediately. SEEK MEDICAL CARE IF:   You have an injury that is not healing.  You have cuts or breaks in the skin.  You have an ingrown nail.  You notice redness on your legs or feet.  You feel burning or tingling in your legs or feet.  You have pain or cramps in your legs and feet.  Your legs or feet are numb.  Your feet always feel cold. SEEK IMMEDIATE MEDICAL CARE IF:   There is increasing redness,   swelling, or pain in or around a wound.  There is a red line that goes up your leg.  Pus is coming from a wound.  You develop a fever or as directed by your health care provider.  You notice a bad smell coming from an ulcer or wound. Document Released: 01/17/2000 Document Revised: 09/21/2012 Document Reviewed: 06/28/2012 ExitCare Patient Information 2015 [employer]. This information is not intended to replace advice given to you by your health care provider. Make sure you discuss any questions you have with your health care provider.  

## 2014-01-19 NOTE — Progress Notes (Signed)
   Subjective:    Patient ID: Evan Vaughan, male    DOB: 01/11/1935, 79 y.o.   MRN: [id number]  HPI  TOENAILS TRIM.  Review of Systems neurovascular status   unchanged no new findings or systemic changes noted Objective:   Physical Exam Lower extremity objective findings reveal pedal pulses palpable DP +2 PT plus one over 4 Refill time 3 seconds all digits epicritic and proprioceptive sensations intact although decreased forefoot and ball foot and arch in [employer] testing there is normal plantar response DTRs not listed neurologically skin color pigment normal hair growth absent nails unremarkable somewhat criptotic some nails noted no open wounds no ulcers no secondary infections       Assessment & Plan:  Assessment this time his diabetes with history peripheral neuropathy early use of diabetic neuropathy noted. Patient does have some muscling gait weakness at times otherwise unremarkable nails thick criptotic incurvated otherwise no other changes no secondary infections no open wounds or ulcers debridement of dystrophic nails 1 through 5 bilateral return for diabetic foot nail care every 3 months as recommended  Harriet Masson DPM

## 2014-03-28 DIAGNOSIS — I10 Essential (primary) hypertension: Secondary | ICD-10-CM | POA: Diagnosis not present

## 2014-03-28 DIAGNOSIS — Z6822 Body mass index (BMI) 22.0-22.9, adult: Secondary | ICD-10-CM | POA: Diagnosis not present

## 2014-03-28 DIAGNOSIS — F329 Major depressive disorder, single episode, unspecified: Secondary | ICD-10-CM | POA: Diagnosis not present

## 2014-03-28 DIAGNOSIS — F411 Generalized anxiety disorder: Secondary | ICD-10-CM | POA: Diagnosis not present

## 2014-03-28 DIAGNOSIS — E1165 Type 2 diabetes mellitus with hyperglycemia: Secondary | ICD-10-CM | POA: Diagnosis not present

## 2014-03-28 DIAGNOSIS — E785 Hyperlipidemia, unspecified: Secondary | ICD-10-CM | POA: Diagnosis not present

## 2014-03-28 DIAGNOSIS — Z79899 Other long term (current) drug therapy: Secondary | ICD-10-CM | POA: Diagnosis not present

## 2014-04-23 ENCOUNTER — Ambulatory Visit: Payer: Medicare Other

## 2014-05-31 ENCOUNTER — Ambulatory Visit (INDEPENDENT_AMBULATORY_CARE_PROVIDER_SITE_OTHER): Payer: Medicare Other | Admitting: Podiatrist

## 2014-05-31 ENCOUNTER — Encounter: Payer: Self-pay | Admitting: Podiatrist

## 2014-05-31 DIAGNOSIS — E1140 Type 2 diabetes mellitus with diabetic neuropathy, unspecified: Secondary | ICD-10-CM

## 2014-05-31 DIAGNOSIS — L603 Nail dystrophy: Secondary | ICD-10-CM

## 2014-05-31 NOTE — Patient Instructions (Signed)
Diabetes and Foot Care Diabetes may cause you to have problems because of poor blood supply (circulation) to your feet and legs. This may cause the skin on your feet to become thinner, break easier, and heal more slowly. Your skin may become dry, and the skin may peel and crack. You may also have nerve damage in your legs and feet causing decreased feeling in them. You may not notice minor injuries to your feet that could lead to infections or more serious problems. Taking care of your feet is one of the most important things you can do for yourself.  HOME CARE INSTRUCTIONS  Wear shoes at all times, even in the house. Do not go barefoot. Bare feet are easily injured.  Check your feet daily for blisters, cuts, and redness. If you cannot see the bottom of your feet, use a mirror or ask someone for help.  Wash your feet with warm water (do not use hot water) and mild soap. Then pat your feet and the areas between your toes until they are completely dry. Do not soak your feet as this can dry your skin.  Apply a moisturizing lotion or petroleum jelly (that does not contain alcohol and is unscented) to the skin on your feet and to dry, brittle toenails. Do not apply lotion between your toes.  Trim your toenails straight across. Do not dig under them or around the cuticle. File the edges of your nails with an emery board or nail file.  Do not cut corns or calluses or try to remove them with medicine.  Wear clean socks or stockings every day. Make sure they are not too tight. Do not wear knee-high stockings since they may decrease blood flow to your legs.  Wear shoes that fit properly and have enough cushioning. To break in new shoes, wear them for just a few hours a day. This prevents you from injuring your feet. Always look in your shoes before you put them on to be sure there are no objects inside.  Do not cross your legs. This may decrease the blood flow to your feet.  If you find a minor scrape,  cut, or break in the skin on your feet, keep it and the skin around it clean and dry. These areas may be cleansed with mild soap and water. Do not cleanse the area with peroxide, alcohol, or iodine.  When you remove an adhesive bandage, be sure not to damage the skin around it.  If you have a wound, look at it several times a day to make sure it is healing.  Do not use heating pads or hot water bottles. They may burn your skin. If you have lost feeling in your feet or legs, you may not know it is happening until it is too late.  Make sure your health care provider performs a complete foot exam at least annually or more often if you have foot problems. Report any cuts, sores, or bruises to your health care provider immediately. SEEK MEDICAL CARE IF:   You have an injury that is not healing.  You have cuts or breaks in the skin.  You have an ingrown nail.  You notice redness on your legs or feet.  You feel burning or tingling in your legs or feet.  You have pain or cramps in your legs and feet.  Your legs or feet are numb.  Your feet always feel cold. SEEK IMMEDIATE MEDICAL CARE IF:   There is increasing redness,   swelling, or pain in or around a wound.  There is a red line that goes up your leg.  Pus is coming from a wound.  You develop a fever or as directed by your health care provider.  You notice a bad smell coming from an ulcer or wound. Document Released: 01/17/2000 Document Revised: 09/21/2012 Document Reviewed: 06/28/2012 ExitCare Patient Information 2015 [employer]. This information is not intended to replace advice given to you by your health care provider. Make sure you discuss any questions you have with your health care provider.  

## 2014-05-31 NOTE — Progress Notes (Signed)
HPI:  Patient presents today for follow up of foot and nail care. Denies any new complaints today.   Objective:  Patients chart is reviewed.  Vascular status reveals pedal pulses noted at 2 out of 4 dp and pt bilateral .  Neurological sensation is decreased to [employer] monofilament bilateral and peripheral neuropathy is noted.  Patients nails are thickened, discolored, distrophic, friable and brittle with yellow-brown discoloration. Patient subjectively relates they are painful with shoes and with ambulation of bilateral feet. Right foot has had a previous fusion for a flat foot repair. From the incisions it appears to have been a triple arthrodesis.  He is getting along fine with this repair.  Left foot pes planus is also noted.   Assessment:  Symptomatic onychomycosis  Plan:  Discussed treatment options and alternatives.  The symptomatic toenails were debrided through manual an mechanical means without complication.  Return appointment recommended at routine intervals of 3 months    Trudie Buckler, DPM

## 2014-06-01 ENCOUNTER — Ambulatory Visit: Payer: Medicare Other | Admitting: Podiatrist

## 2014-06-26 DIAGNOSIS — C44329 Squamous cell carcinoma of skin of other parts of face: Secondary | ICD-10-CM | POA: Diagnosis not present

## 2014-06-26 DIAGNOSIS — C4441 Basal cell carcinoma of skin of scalp and neck: Secondary | ICD-10-CM | POA: Diagnosis not present

## 2014-07-03 DIAGNOSIS — D044 Carcinoma in situ of skin of scalp and neck: Secondary | ICD-10-CM | POA: Diagnosis not present

## 2014-08-01 DIAGNOSIS — Z6822 Body mass index (BMI) 22.0-22.9, adult: Secondary | ICD-10-CM | POA: Diagnosis not present

## 2014-08-01 DIAGNOSIS — I10 Essential (primary) hypertension: Secondary | ICD-10-CM | POA: Diagnosis not present

## 2014-08-01 DIAGNOSIS — E1165 Type 2 diabetes mellitus with hyperglycemia: Secondary | ICD-10-CM | POA: Diagnosis not present

## 2014-08-01 DIAGNOSIS — E785 Hyperlipidemia, unspecified: Secondary | ICD-10-CM | POA: Diagnosis not present

## 2014-08-01 DIAGNOSIS — F329 Major depressive disorder, single episode, unspecified: Secondary | ICD-10-CM | POA: Diagnosis not present

## 2014-08-01 DIAGNOSIS — F411 Generalized anxiety disorder: Secondary | ICD-10-CM | POA: Diagnosis not present

## 2014-08-01 DIAGNOSIS — Z79899 Other long term (current) drug therapy: Secondary | ICD-10-CM | POA: Diagnosis not present

## 2014-08-08 DIAGNOSIS — E119 Type 2 diabetes mellitus without complications: Secondary | ICD-10-CM | POA: Diagnosis not present

## 2014-09-05 DIAGNOSIS — G894 Chronic pain syndrome: Secondary | ICD-10-CM | POA: Insufficient documentation

## 2014-09-05 DIAGNOSIS — Z6822 Body mass index (BMI) 22.0-22.9, adult: Secondary | ICD-10-CM | POA: Diagnosis not present

## 2014-09-05 DIAGNOSIS — R413 Other amnesia: Secondary | ICD-10-CM | POA: Diagnosis not present

## 2014-09-11 DIAGNOSIS — R413 Other amnesia: Secondary | ICD-10-CM | POA: Diagnosis not present

## 2014-09-20 DIAGNOSIS — G912 (Idiopathic) normal pressure hydrocephalus: Secondary | ICD-10-CM | POA: Diagnosis not present

## 2014-09-20 DIAGNOSIS — Z6823 Body mass index (BMI) 23.0-23.9, adult: Secondary | ICD-10-CM | POA: Diagnosis not present

## 2014-10-05 DIAGNOSIS — G912 (Idiopathic) normal pressure hydrocephalus: Secondary | ICD-10-CM | POA: Diagnosis not present

## 2014-10-05 DIAGNOSIS — R413 Other amnesia: Secondary | ICD-10-CM | POA: Diagnosis not present

## 2014-10-10 DIAGNOSIS — G912 (Idiopathic) normal pressure hydrocephalus: Secondary | ICD-10-CM | POA: Diagnosis not present

## 2014-11-14 DIAGNOSIS — R838 Other abnormal findings in cerebrospinal fluid: Secondary | ICD-10-CM | POA: Diagnosis not present

## 2014-11-14 DIAGNOSIS — R413 Other amnesia: Secondary | ICD-10-CM | POA: Diagnosis not present

## 2014-11-28 DIAGNOSIS — Z6822 Body mass index (BMI) 22.0-22.9, adult: Secondary | ICD-10-CM | POA: Diagnosis not present

## 2014-11-28 DIAGNOSIS — Z79899 Other long term (current) drug therapy: Secondary | ICD-10-CM | POA: Diagnosis not present

## 2014-11-28 DIAGNOSIS — E785 Hyperlipidemia, unspecified: Secondary | ICD-10-CM | POA: Diagnosis not present

## 2014-11-28 DIAGNOSIS — F329 Major depressive disorder, single episode, unspecified: Secondary | ICD-10-CM | POA: Diagnosis not present

## 2014-11-28 DIAGNOSIS — Z23 Encounter for immunization: Secondary | ICD-10-CM | POA: Diagnosis not present

## 2014-11-28 DIAGNOSIS — E1165 Type 2 diabetes mellitus with hyperglycemia: Secondary | ICD-10-CM | POA: Diagnosis not present

## 2014-11-28 DIAGNOSIS — I10 Essential (primary) hypertension: Secondary | ICD-10-CM | POA: Diagnosis not present

## 2014-11-28 DIAGNOSIS — F411 Generalized anxiety disorder: Secondary | ICD-10-CM | POA: Diagnosis not present

## 2015-01-02 DIAGNOSIS — R350 Frequency of micturition: Secondary | ICD-10-CM | POA: Diagnosis not present

## 2015-01-02 DIAGNOSIS — R4182 Altered mental status, unspecified: Secondary | ICD-10-CM | POA: Diagnosis not present

## 2015-01-02 DIAGNOSIS — Z6822 Body mass index (BMI) 22.0-22.9, adult: Secondary | ICD-10-CM | POA: Diagnosis not present

## 2015-01-02 DIAGNOSIS — G301 Alzheimer's disease with late onset: Secondary | ICD-10-CM | POA: Diagnosis not present

## 2015-01-11 DIAGNOSIS — J069 Acute upper respiratory infection, unspecified: Secondary | ICD-10-CM | POA: Diagnosis not present

## 2015-01-11 DIAGNOSIS — Z6822 Body mass index (BMI) 22.0-22.9, adult: Secondary | ICD-10-CM | POA: Diagnosis not present

## 2015-01-24 DIAGNOSIS — R5383 Other fatigue: Secondary | ICD-10-CM | POA: Diagnosis not present

## 2015-01-24 DIAGNOSIS — R2681 Unsteadiness on feet: Secondary | ICD-10-CM | POA: Diagnosis not present

## 2015-01-24 DIAGNOSIS — R2689 Other abnormalities of gait and mobility: Secondary | ICD-10-CM | POA: Diagnosis not present

## 2015-01-24 DIAGNOSIS — M6281 Muscle weakness (generalized): Secondary | ICD-10-CM | POA: Diagnosis not present

## 2015-01-24 DIAGNOSIS — R531 Weakness: Secondary | ICD-10-CM | POA: Diagnosis not present

## 2015-01-30 DIAGNOSIS — Z79899 Other long term (current) drug therapy: Secondary | ICD-10-CM | POA: Diagnosis not present

## 2015-01-30 DIAGNOSIS — Z6822 Body mass index (BMI) 22.0-22.9, adult: Secondary | ICD-10-CM | POA: Diagnosis not present

## 2015-01-30 DIAGNOSIS — F411 Generalized anxiety disorder: Secondary | ICD-10-CM | POA: Diagnosis not present

## 2015-01-30 DIAGNOSIS — F329 Major depressive disorder, single episode, unspecified: Secondary | ICD-10-CM | POA: Diagnosis not present

## 2015-01-30 DIAGNOSIS — G301 Alzheimer's disease with late onset: Secondary | ICD-10-CM | POA: Diagnosis not present

## 2015-01-31 DIAGNOSIS — R2689 Other abnormalities of gait and mobility: Secondary | ICD-10-CM | POA: Diagnosis not present

## 2015-01-31 DIAGNOSIS — R531 Weakness: Secondary | ICD-10-CM | POA: Diagnosis not present

## 2015-01-31 DIAGNOSIS — R2681 Unsteadiness on feet: Secondary | ICD-10-CM | POA: Diagnosis not present

## 2015-01-31 DIAGNOSIS — M6281 Muscle weakness (generalized): Secondary | ICD-10-CM | POA: Diagnosis not present

## 2015-01-31 DIAGNOSIS — R5383 Other fatigue: Secondary | ICD-10-CM | POA: Diagnosis not present

## 2015-02-06 DIAGNOSIS — M6281 Muscle weakness (generalized): Secondary | ICD-10-CM | POA: Diagnosis not present

## 2015-02-06 DIAGNOSIS — R531 Weakness: Secondary | ICD-10-CM | POA: Diagnosis not present

## 2015-02-06 DIAGNOSIS — R5383 Other fatigue: Secondary | ICD-10-CM | POA: Diagnosis not present

## 2015-02-06 DIAGNOSIS — R2689 Other abnormalities of gait and mobility: Secondary | ICD-10-CM | POA: Diagnosis not present

## 2015-02-06 DIAGNOSIS — R2681 Unsteadiness on feet: Secondary | ICD-10-CM | POA: Diagnosis not present

## 2015-02-13 DIAGNOSIS — R2681 Unsteadiness on feet: Secondary | ICD-10-CM | POA: Diagnosis not present

## 2015-02-13 DIAGNOSIS — R531 Weakness: Secondary | ICD-10-CM | POA: Diagnosis not present

## 2015-02-13 DIAGNOSIS — R2689 Other abnormalities of gait and mobility: Secondary | ICD-10-CM | POA: Diagnosis not present

## 2015-02-13 DIAGNOSIS — M6281 Muscle weakness (generalized): Secondary | ICD-10-CM | POA: Diagnosis not present

## 2015-02-13 DIAGNOSIS — R5383 Other fatigue: Secondary | ICD-10-CM | POA: Diagnosis not present

## 2015-02-22 DIAGNOSIS — M6281 Muscle weakness (generalized): Secondary | ICD-10-CM | POA: Diagnosis not present

## 2015-02-22 DIAGNOSIS — R2681 Unsteadiness on feet: Secondary | ICD-10-CM | POA: Diagnosis not present

## 2015-02-22 DIAGNOSIS — R2689 Other abnormalities of gait and mobility: Secondary | ICD-10-CM | POA: Diagnosis not present

## 2015-02-22 DIAGNOSIS — R5383 Other fatigue: Secondary | ICD-10-CM | POA: Diagnosis not present

## 2015-02-22 DIAGNOSIS — R531 Weakness: Secondary | ICD-10-CM | POA: Diagnosis not present

## 2015-02-27 DIAGNOSIS — R5383 Other fatigue: Secondary | ICD-10-CM | POA: Diagnosis not present

## 2015-02-27 DIAGNOSIS — M6281 Muscle weakness (generalized): Secondary | ICD-10-CM | POA: Diagnosis not present

## 2015-02-27 DIAGNOSIS — R531 Weakness: Secondary | ICD-10-CM | POA: Diagnosis not present

## 2015-02-27 DIAGNOSIS — R2681 Unsteadiness on feet: Secondary | ICD-10-CM | POA: Diagnosis not present

## 2015-02-27 DIAGNOSIS — R2689 Other abnormalities of gait and mobility: Secondary | ICD-10-CM | POA: Diagnosis not present

## 2015-03-06 DIAGNOSIS — L501 Idiopathic urticaria: Secondary | ICD-10-CM | POA: Diagnosis not present

## 2015-03-06 DIAGNOSIS — L578 Other skin changes due to chronic exposure to nonionizing radiation: Secondary | ICD-10-CM | POA: Diagnosis not present

## 2015-03-07 DIAGNOSIS — R531 Weakness: Secondary | ICD-10-CM | POA: Diagnosis not present

## 2015-03-07 DIAGNOSIS — R2681 Unsteadiness on feet: Secondary | ICD-10-CM | POA: Diagnosis not present

## 2015-03-07 DIAGNOSIS — R5383 Other fatigue: Secondary | ICD-10-CM | POA: Diagnosis not present

## 2015-03-07 DIAGNOSIS — R2689 Other abnormalities of gait and mobility: Secondary | ICD-10-CM | POA: Diagnosis not present

## 2015-03-07 DIAGNOSIS — M6281 Muscle weakness (generalized): Secondary | ICD-10-CM | POA: Diagnosis not present

## 2015-03-13 DIAGNOSIS — R2681 Unsteadiness on feet: Secondary | ICD-10-CM | POA: Diagnosis not present

## 2015-03-13 DIAGNOSIS — M6281 Muscle weakness (generalized): Secondary | ICD-10-CM | POA: Diagnosis not present

## 2015-03-13 DIAGNOSIS — R531 Weakness: Secondary | ICD-10-CM | POA: Diagnosis not present

## 2015-03-13 DIAGNOSIS — R5383 Other fatigue: Secondary | ICD-10-CM | POA: Diagnosis not present

## 2015-03-13 DIAGNOSIS — R2689 Other abnormalities of gait and mobility: Secondary | ICD-10-CM | POA: Diagnosis not present

## 2015-04-03 DIAGNOSIS — F329 Major depressive disorder, single episode, unspecified: Secondary | ICD-10-CM | POA: Diagnosis not present

## 2015-04-03 DIAGNOSIS — F411 Generalized anxiety disorder: Secondary | ICD-10-CM | POA: Diagnosis not present

## 2015-04-03 DIAGNOSIS — I739 Peripheral vascular disease, unspecified: Secondary | ICD-10-CM | POA: Diagnosis not present

## 2015-04-03 DIAGNOSIS — E1165 Type 2 diabetes mellitus with hyperglycemia: Secondary | ICD-10-CM | POA: Diagnosis not present

## 2015-04-03 DIAGNOSIS — Z79899 Other long term (current) drug therapy: Secondary | ICD-10-CM | POA: Diagnosis not present

## 2015-04-03 DIAGNOSIS — E785 Hyperlipidemia, unspecified: Secondary | ICD-10-CM | POA: Diagnosis not present

## 2015-04-03 DIAGNOSIS — Z6822 Body mass index (BMI) 22.0-22.9, adult: Secondary | ICD-10-CM | POA: Diagnosis not present

## 2015-04-03 DIAGNOSIS — G301 Alzheimer's disease with late onset: Secondary | ICD-10-CM | POA: Diagnosis not present

## 2015-04-03 DIAGNOSIS — I10 Essential (primary) hypertension: Secondary | ICD-10-CM | POA: Diagnosis not present

## 2015-04-17 DIAGNOSIS — L578 Other skin changes due to chronic exposure to nonionizing radiation: Secondary | ICD-10-CM | POA: Diagnosis not present

## 2015-04-17 DIAGNOSIS — D044 Carcinoma in situ of skin of scalp and neck: Secondary | ICD-10-CM | POA: Diagnosis not present

## 2015-04-17 DIAGNOSIS — L821 Other seborrheic keratosis: Secondary | ICD-10-CM | POA: Diagnosis not present

## 2015-04-17 DIAGNOSIS — L570 Actinic keratosis: Secondary | ICD-10-CM | POA: Diagnosis not present

## 2015-05-15 DIAGNOSIS — Z6822 Body mass index (BMI) 22.0-22.9, adult: Secondary | ICD-10-CM | POA: Diagnosis not present

## 2015-05-15 DIAGNOSIS — Z1389 Encounter for screening for other disorder: Secondary | ICD-10-CM | POA: Diagnosis not present

## 2015-05-15 DIAGNOSIS — K529 Noninfective gastroenteritis and colitis, unspecified: Secondary | ICD-10-CM | POA: Diagnosis not present

## 2015-05-20 DIAGNOSIS — E869 Volume depletion, unspecified: Secondary | ICD-10-CM | POA: Diagnosis not present

## 2015-05-20 DIAGNOSIS — R1110 Vomiting, unspecified: Secondary | ICD-10-CM | POA: Diagnosis not present

## 2015-05-20 DIAGNOSIS — Z6822 Body mass index (BMI) 22.0-22.9, adult: Secondary | ICD-10-CM | POA: Diagnosis not present

## 2015-05-20 DIAGNOSIS — K529 Noninfective gastroenteritis and colitis, unspecified: Secondary | ICD-10-CM | POA: Diagnosis not present

## 2015-06-13 DIAGNOSIS — A09 Infectious gastroenteritis and colitis, unspecified: Secondary | ICD-10-CM | POA: Diagnosis not present

## 2015-08-16 DIAGNOSIS — R4189 Other symptoms and signs involving cognitive functions and awareness: Secondary | ICD-10-CM | POA: Diagnosis not present

## 2015-08-16 DIAGNOSIS — F0391 Unspecified dementia with behavioral disturbance: Secondary | ICD-10-CM | POA: Insufficient documentation

## 2015-08-16 DIAGNOSIS — F03918 Unspecified dementia, unspecified severity, with other behavioral disturbance: Secondary | ICD-10-CM | POA: Insufficient documentation

## 2015-08-16 DIAGNOSIS — F0390 Unspecified dementia without behavioral disturbance: Secondary | ICD-10-CM | POA: Diagnosis not present

## 2015-08-28 DIAGNOSIS — F0390 Unspecified dementia without behavioral disturbance: Secondary | ICD-10-CM | POA: Diagnosis not present

## 2015-09-09 DIAGNOSIS — L501 Idiopathic urticaria: Secondary | ICD-10-CM | POA: Diagnosis not present

## 2015-09-09 DIAGNOSIS — L299 Pruritus, unspecified: Secondary | ICD-10-CM | POA: Diagnosis not present

## 2015-09-09 DIAGNOSIS — L821 Other seborrheic keratosis: Secondary | ICD-10-CM | POA: Diagnosis not present

## 2015-09-09 DIAGNOSIS — L570 Actinic keratosis: Secondary | ICD-10-CM | POA: Diagnosis not present

## 2015-10-28 DIAGNOSIS — Z23 Encounter for immunization: Secondary | ICD-10-CM | POA: Diagnosis not present

## 2015-10-31 DIAGNOSIS — Z9181 History of falling: Secondary | ICD-10-CM | POA: Diagnosis not present

## 2015-10-31 DIAGNOSIS — E869 Volume depletion, unspecified: Secondary | ICD-10-CM | POA: Diagnosis not present

## 2015-10-31 DIAGNOSIS — R110 Nausea: Secondary | ICD-10-CM | POA: Diagnosis not present

## 2015-10-31 DIAGNOSIS — Z79899 Other long term (current) drug therapy: Secondary | ICD-10-CM | POA: Diagnosis not present

## 2015-10-31 DIAGNOSIS — Z6821 Body mass index (BMI) 21.0-21.9, adult: Secondary | ICD-10-CM | POA: Diagnosis not present

## 2015-10-31 DIAGNOSIS — G301 Alzheimer's disease with late onset: Secondary | ICD-10-CM | POA: Diagnosis not present

## 2015-11-08 DIAGNOSIS — R531 Weakness: Secondary | ICD-10-CM | POA: Diagnosis not present

## 2015-11-08 DIAGNOSIS — K3184 Gastroparesis: Secondary | ICD-10-CM | POA: Diagnosis not present

## 2015-11-08 DIAGNOSIS — N200 Calculus of kidney: Secondary | ICD-10-CM | POA: Diagnosis not present

## 2015-11-11 DIAGNOSIS — L300 Nummular dermatitis: Secondary | ICD-10-CM | POA: Diagnosis not present

## 2015-11-11 DIAGNOSIS — L299 Pruritus, unspecified: Secondary | ICD-10-CM | POA: Diagnosis not present

## 2015-11-11 DIAGNOSIS — L853 Xerosis cutis: Secondary | ICD-10-CM | POA: Diagnosis not present

## 2015-12-20 DIAGNOSIS — Z8673 Personal history of transient ischemic attack (TIA), and cerebral infarction without residual deficits: Secondary | ICD-10-CM | POA: Diagnosis not present

## 2015-12-20 DIAGNOSIS — Z79899 Other long term (current) drug therapy: Secondary | ICD-10-CM | POA: Diagnosis not present

## 2015-12-20 DIAGNOSIS — Z7984 Long term (current) use of oral hypoglycemic drugs: Secondary | ICD-10-CM | POA: Diagnosis not present

## 2015-12-20 DIAGNOSIS — R4189 Other symptoms and signs involving cognitive functions and awareness: Secondary | ICD-10-CM | POA: Diagnosis not present

## 2015-12-20 DIAGNOSIS — F329 Major depressive disorder, single episode, unspecified: Secondary | ICD-10-CM | POA: Diagnosis not present

## 2015-12-20 DIAGNOSIS — Z7982 Long term (current) use of aspirin: Secondary | ICD-10-CM | POA: Diagnosis not present

## 2015-12-20 DIAGNOSIS — F0390 Unspecified dementia without behavioral disturbance: Secondary | ICD-10-CM | POA: Diagnosis not present

## 2015-12-20 DIAGNOSIS — I10 Essential (primary) hypertension: Secondary | ICD-10-CM | POA: Diagnosis not present

## 2015-12-20 DIAGNOSIS — E119 Type 2 diabetes mellitus without complications: Secondary | ICD-10-CM | POA: Diagnosis not present

## 2016-01-02 DIAGNOSIS — E785 Hyperlipidemia, unspecified: Secondary | ICD-10-CM | POA: Diagnosis not present

## 2016-01-02 DIAGNOSIS — Z79899 Other long term (current) drug therapy: Secondary | ICD-10-CM | POA: Diagnosis not present

## 2016-01-02 DIAGNOSIS — I10 Essential (primary) hypertension: Secondary | ICD-10-CM | POA: Diagnosis not present

## 2016-01-02 DIAGNOSIS — G301 Alzheimer's disease with late onset: Secondary | ICD-10-CM | POA: Diagnosis not present

## 2016-01-02 DIAGNOSIS — E1165 Type 2 diabetes mellitus with hyperglycemia: Secondary | ICD-10-CM | POA: Diagnosis not present

## 2016-01-07 DIAGNOSIS — R112 Nausea with vomiting, unspecified: Secondary | ICD-10-CM | POA: Diagnosis not present

## 2016-02-07 DIAGNOSIS — E785 Hyperlipidemia, unspecified: Secondary | ICD-10-CM | POA: Diagnosis not present

## 2016-02-10 DIAGNOSIS — E1165 Type 2 diabetes mellitus with hyperglycemia: Secondary | ICD-10-CM | POA: Diagnosis not present

## 2016-02-10 DIAGNOSIS — Z79899 Other long term (current) drug therapy: Secondary | ICD-10-CM | POA: Diagnosis not present

## 2016-02-10 DIAGNOSIS — G301 Alzheimer's disease with late onset: Secondary | ICD-10-CM | POA: Diagnosis not present

## 2016-04-01 DIAGNOSIS — E559 Vitamin D deficiency, unspecified: Secondary | ICD-10-CM | POA: Diagnosis not present

## 2016-04-01 DIAGNOSIS — Z79899 Other long term (current) drug therapy: Secondary | ICD-10-CM | POA: Diagnosis not present

## 2016-04-01 DIAGNOSIS — F411 Generalized anxiety disorder: Secondary | ICD-10-CM | POA: Diagnosis not present

## 2016-04-01 DIAGNOSIS — E785 Hyperlipidemia, unspecified: Secondary | ICD-10-CM | POA: Diagnosis not present

## 2016-04-01 DIAGNOSIS — E1165 Type 2 diabetes mellitus with hyperglycemia: Secondary | ICD-10-CM | POA: Diagnosis not present

## 2016-04-01 DIAGNOSIS — I10 Essential (primary) hypertension: Secondary | ICD-10-CM | POA: Diagnosis not present

## 2016-04-01 DIAGNOSIS — G301 Alzheimer's disease with late onset: Secondary | ICD-10-CM | POA: Diagnosis not present

## 2016-04-20 DIAGNOSIS — M549 Dorsalgia, unspecified: Secondary | ICD-10-CM | POA: Diagnosis not present

## 2016-04-20 DIAGNOSIS — E1165 Type 2 diabetes mellitus with hyperglycemia: Secondary | ICD-10-CM | POA: Diagnosis not present

## 2016-04-20 DIAGNOSIS — F419 Anxiety disorder, unspecified: Secondary | ICD-10-CM | POA: Diagnosis not present

## 2016-04-20 DIAGNOSIS — E785 Hyperlipidemia, unspecified: Secondary | ICD-10-CM | POA: Diagnosis not present

## 2016-04-20 DIAGNOSIS — Z794 Long term (current) use of insulin: Secondary | ICD-10-CM | POA: Diagnosis not present

## 2016-04-20 DIAGNOSIS — R531 Weakness: Secondary | ICD-10-CM | POA: Diagnosis not present

## 2016-04-20 DIAGNOSIS — G309 Alzheimer's disease, unspecified: Secondary | ICD-10-CM | POA: Diagnosis not present

## 2016-04-20 DIAGNOSIS — I440 Atrioventricular block, first degree: Secondary | ICD-10-CM | POA: Diagnosis not present

## 2016-05-07 DIAGNOSIS — N200 Calculus of kidney: Secondary | ICD-10-CM | POA: Diagnosis not present

## 2016-05-07 DIAGNOSIS — M545 Low back pain: Secondary | ICD-10-CM | POA: Diagnosis not present

## 2016-05-07 DIAGNOSIS — M5137 Other intervertebral disc degeneration, lumbosacral region: Secondary | ICD-10-CM | POA: Diagnosis not present

## 2016-05-11 DIAGNOSIS — E1165 Type 2 diabetes mellitus with hyperglycemia: Secondary | ICD-10-CM | POA: Diagnosis not present

## 2016-05-11 DIAGNOSIS — I10 Essential (primary) hypertension: Secondary | ICD-10-CM | POA: Diagnosis not present

## 2016-05-11 DIAGNOSIS — F419 Anxiety disorder, unspecified: Secondary | ICD-10-CM | POA: Diagnosis not present

## 2016-05-11 DIAGNOSIS — G309 Alzheimer's disease, unspecified: Secondary | ICD-10-CM | POA: Diagnosis not present

## 2016-05-20 DIAGNOSIS — F419 Anxiety disorder, unspecified: Secondary | ICD-10-CM | POA: Diagnosis not present

## 2016-05-20 DIAGNOSIS — Z79899 Other long term (current) drug therapy: Secondary | ICD-10-CM | POA: Diagnosis not present

## 2016-05-20 DIAGNOSIS — Z0001 Encounter for general adult medical examination with abnormal findings: Secondary | ICD-10-CM | POA: Diagnosis not present

## 2016-05-20 DIAGNOSIS — E785 Hyperlipidemia, unspecified: Secondary | ICD-10-CM | POA: Diagnosis not present

## 2016-05-20 DIAGNOSIS — I10 Essential (primary) hypertension: Secondary | ICD-10-CM | POA: Diagnosis not present

## 2016-05-20 DIAGNOSIS — Z1389 Encounter for screening for other disorder: Secondary | ICD-10-CM | POA: Diagnosis not present

## 2016-05-20 DIAGNOSIS — I440 Atrioventricular block, first degree: Secondary | ICD-10-CM | POA: Diagnosis not present

## 2016-05-20 DIAGNOSIS — R531 Weakness: Secondary | ICD-10-CM | POA: Diagnosis not present

## 2016-05-20 DIAGNOSIS — E1165 Type 2 diabetes mellitus with hyperglycemia: Secondary | ICD-10-CM | POA: Diagnosis not present

## 2016-05-20 DIAGNOSIS — G309 Alzheimer's disease, unspecified: Secondary | ICD-10-CM | POA: Diagnosis not present

## 2016-05-26 DIAGNOSIS — L299 Pruritus, unspecified: Secondary | ICD-10-CM | POA: Diagnosis not present

## 2016-05-26 DIAGNOSIS — L300 Nummular dermatitis: Secondary | ICD-10-CM | POA: Diagnosis not present

## 2016-05-26 DIAGNOSIS — R531 Weakness: Secondary | ICD-10-CM | POA: Diagnosis not present

## 2016-05-28 DIAGNOSIS — I440 Atrioventricular block, first degree: Secondary | ICD-10-CM | POA: Diagnosis not present

## 2016-05-28 DIAGNOSIS — R531 Weakness: Secondary | ICD-10-CM | POA: Diagnosis not present

## 2016-06-25 DIAGNOSIS — E1122 Type 2 diabetes mellitus with diabetic chronic kidney disease: Secondary | ICD-10-CM | POA: Insufficient documentation

## 2016-06-25 DIAGNOSIS — E782 Mixed hyperlipidemia: Secondary | ICD-10-CM

## 2016-06-25 DIAGNOSIS — N183 Chronic kidney disease, stage 3 unspecified: Secondary | ICD-10-CM | POA: Insufficient documentation

## 2016-06-25 DIAGNOSIS — I129 Hypertensive chronic kidney disease with stage 1 through stage 4 chronic kidney disease, or unspecified chronic kidney disease: Secondary | ICD-10-CM

## 2016-06-25 DIAGNOSIS — E785 Hyperlipidemia, unspecified: Secondary | ICD-10-CM | POA: Diagnosis not present

## 2016-06-25 DIAGNOSIS — E1169 Type 2 diabetes mellitus with other specified complication: Secondary | ICD-10-CM | POA: Insufficient documentation

## 2016-06-25 DIAGNOSIS — I10 Essential (primary) hypertension: Secondary | ICD-10-CM | POA: Insufficient documentation

## 2016-06-25 DIAGNOSIS — R55 Syncope and collapse: Secondary | ICD-10-CM | POA: Diagnosis not present

## 2016-06-25 DIAGNOSIS — E119 Type 2 diabetes mellitus without complications: Secondary | ICD-10-CM | POA: Diagnosis not present

## 2016-06-25 DIAGNOSIS — R9431 Abnormal electrocardiogram [ECG] [EKG]: Secondary | ICD-10-CM | POA: Diagnosis not present

## 2016-06-25 DIAGNOSIS — Z6822 Body mass index (BMI) 22.0-22.9, adult: Secondary | ICD-10-CM | POA: Diagnosis not present

## 2016-06-25 HISTORY — DX: Type 2 diabetes mellitus with other specified complication: E78.2

## 2016-06-25 HISTORY — DX: Type 2 diabetes mellitus with other specified complication: E11.69

## 2016-07-07 DIAGNOSIS — R9431 Abnormal electrocardiogram [ECG] [EKG]: Secondary | ICD-10-CM | POA: Diagnosis not present

## 2016-07-07 DIAGNOSIS — I10 Essential (primary) hypertension: Secondary | ICD-10-CM | POA: Diagnosis not present

## 2016-07-07 DIAGNOSIS — E119 Type 2 diabetes mellitus without complications: Secondary | ICD-10-CM | POA: Diagnosis not present

## 2016-07-07 DIAGNOSIS — R55 Syncope and collapse: Secondary | ICD-10-CM | POA: Diagnosis not present

## 2016-07-07 DIAGNOSIS — E785 Hyperlipidemia, unspecified: Secondary | ICD-10-CM | POA: Diagnosis not present

## 2016-07-21 DIAGNOSIS — I10 Essential (primary) hypertension: Secondary | ICD-10-CM | POA: Diagnosis not present

## 2016-07-21 DIAGNOSIS — E1165 Type 2 diabetes mellitus with hyperglycemia: Secondary | ICD-10-CM | POA: Diagnosis not present

## 2016-07-21 DIAGNOSIS — F419 Anxiety disorder, unspecified: Secondary | ICD-10-CM | POA: Diagnosis not present

## 2016-07-21 DIAGNOSIS — G309 Alzheimer's disease, unspecified: Secondary | ICD-10-CM | POA: Diagnosis not present

## 2016-07-21 DIAGNOSIS — E785 Hyperlipidemia, unspecified: Secondary | ICD-10-CM | POA: Diagnosis not present

## 2016-07-21 DIAGNOSIS — Z79899 Other long term (current) drug therapy: Secondary | ICD-10-CM | POA: Diagnosis not present

## 2016-07-23 DIAGNOSIS — E785 Hyperlipidemia, unspecified: Secondary | ICD-10-CM | POA: Diagnosis not present

## 2016-07-23 DIAGNOSIS — R9431 Abnormal electrocardiogram [ECG] [EKG]: Secondary | ICD-10-CM | POA: Diagnosis not present

## 2016-07-23 DIAGNOSIS — R55 Syncope and collapse: Secondary | ICD-10-CM | POA: Diagnosis not present

## 2016-07-23 DIAGNOSIS — I10 Essential (primary) hypertension: Secondary | ICD-10-CM | POA: Diagnosis not present

## 2016-07-23 DIAGNOSIS — E119 Type 2 diabetes mellitus without complications: Secondary | ICD-10-CM | POA: Diagnosis not present

## 2016-08-04 ENCOUNTER — Encounter: Payer: Self-pay | Admitting: *Deleted

## 2016-08-04 DIAGNOSIS — M5416 Radiculopathy, lumbar region: Secondary | ICD-10-CM | POA: Insufficient documentation

## 2016-08-17 ENCOUNTER — Telehealth: Payer: Self-pay | Admitting: Interventional Cardiology

## 2016-08-17 NOTE — Telephone Encounter (Signed)
Records received from [facility] in chart prep.

## 2016-08-18 ENCOUNTER — Encounter: Payer: Self-pay | Admitting: Interventional Cardiology

## 2016-08-18 ENCOUNTER — Encounter (INDEPENDENT_AMBULATORY_CARE_PROVIDER_SITE_OTHER): Payer: Self-pay

## 2016-08-18 ENCOUNTER — Ambulatory Visit (INDEPENDENT_AMBULATORY_CARE_PROVIDER_SITE_OTHER): Payer: Medicare Other | Admitting: Interventional Cardiology

## 2016-08-18 VITALS — BP 100/60 | HR 60 | Ht 73.0 in | Wt 175.8 lb

## 2016-08-18 DIAGNOSIS — I10 Essential (primary) hypertension: Secondary | ICD-10-CM

## 2016-08-18 DIAGNOSIS — I951 Orthostatic hypotension: Secondary | ICD-10-CM | POA: Diagnosis not present

## 2016-08-18 DIAGNOSIS — R9439 Abnormal result of other cardiovascular function study: Secondary | ICD-10-CM | POA: Diagnosis not present

## 2016-08-18 DIAGNOSIS — I440 Atrioventricular block, first degree: Secondary | ICD-10-CM | POA: Diagnosis not present

## 2016-08-18 DIAGNOSIS — E119 Type 2 diabetes mellitus without complications: Secondary | ICD-10-CM

## 2016-08-18 DIAGNOSIS — Z794 Long term (current) use of insulin: Secondary | ICD-10-CM | POA: Diagnosis not present

## 2016-08-18 DIAGNOSIS — E785 Hyperlipidemia, unspecified: Secondary | ICD-10-CM | POA: Diagnosis not present

## 2016-08-18 DIAGNOSIS — F0280 Dementia in other diseases classified elsewhere without behavioral disturbance: Secondary | ICD-10-CM | POA: Diagnosis not present

## 2016-08-18 DIAGNOSIS — G309 Alzheimer's disease, unspecified: Secondary | ICD-10-CM | POA: Diagnosis not present

## 2016-08-18 NOTE — Progress Notes (Addendum)
Cardiology Office Note    Date:  08/18/2016   ID:  Aadin, Gaut [birth year], MRN [id number]  PCP:  Ernestene Kiel, MD  Cardiologist: Sinclair Grooms, MD   Chief Complaint  Patient presents with  . Advice Only    Abnormal ECG    History of Present Illness:  Evan Vaughan is a 82 y.o. male is referred by Dr. Remo Lipps Campbell/Caroline Prochnau for evaluation of an abnormal EKG and concerned about an abnormal stress test  The patient is accompanied by his son and wife. They state he was referred because of an abnormal EKG. They are also concerned because a cardiac evaluation was done earlier this spring by Dr. Agustin Cree but they have not heard the results of a stress test and echocardiogram. A monitor was performed and is viewable in [place] demonstrating a brief atrial run, one episode of nonsustained ventricular tachycardia (4 beats) and otherwise unremarkable.  This referral was made without specific cardiac complaints by the patient. Familyif he has become increasingly unstable during ambulation with frequent falls. They have not recognized any episodes of true syncope. His wife states that he stay seated in the house most of the time. He has difficulty contributing any useful information to the conversation.  Past Medical History:  Diagnosis Date  . Bruises easily   . Diabetes mellitus without complication ([place])     No past surgical history on file.  Current Medications: Outpatient Medications Prior to Visit  Medication Sig Dispense Refill  . omeprazole (PRILOSEC) 20 MG capsule Take 20 mg by mouth daily.     Marland Kitchen lisinopril (PRINIVIL,ZESTRIL) 20 MG tablet Take 20 mg by mouth daily.     Marland Kitchen ALPRAZolam (XANAX) 1 MG tablet     . carvedilol (COREG) 3.125 MG tablet     . FLUVIRIN INJ injection     . glimepiride (AMARYL) 2 MG tablet     . metFORMIN (GLUCOPHAGE) 1000 MG tablet     . metFORMIN (GLUCOPHAGE-XR) 500 MG 24 hr tablet Take 1,000 mg by mouth daily.  3    . pravastatin (PRAVACHOL) 40 MG tablet Take 40 mg by mouth daily.  5  . pravastatin (PRAVACHOL) 80 MG tablet     . triamcinolone cream (KENALOG) 0.1 %     . WELCHOL 625 MG tablet      No facility-administered medications prior to visit.      Allergies:   Patient has no known allergies.   Social History   Social History  . Marital status: Married    Spouse name: N/A  . Number of children: N/A  . Years of education: N/A   Social History Main Topics  . Smoking status: Never Smoker  . Smokeless tobacco: Never Used  . Alcohol use No  . Drug use: No  . Sexual activity: Not Asked   Other Topics Concern  . None   Social History Narrative  . None     Family History:  The patient's family history includes Diabetes in his other; Healthy in his son; Heart Problems in his other; Heart attack in his father; Heart disease in his mother; Hypertension in his father.   ROS:   Please see the history of present illness.    Shortness of breath, depression, muscle pain, goiter with balance, anxiety, snoring, depression, rash, back pain, easy bruising, anxiety, nausea and vomiting, snoring, and difficulty with balance and walking.  All other systems reviewed and are negative.   PHYSICAL EXAM:  VS:  BP 100/60 (BP Location: Right Arm)   Pulse 60   Ht 6\' 1"  (1.854 m)   Wt 175 lb 12.8 oz (79.7 kg)   BMI 23.19 kg/m     Orthostatic blood pressure: Lying 116/60 mmHg heart rate 64; sitting 102/58 mmHg and heart rate 64 bpm; standing 75/50 mmHg and heart rate 72 bpm  GEN: Well nourished, well developed, in no acute distress  HEENT: normal  Neck: no JVD, carotid bruits, or masses Cardiac: RRR; no murmurs, rubs, or gallops,no edema  Respiratory:  clear to auscultation bilaterally, normal work of breathing GI: soft, nontender, nondistended, + BS MS: no deformity or atrophy  Skin: warm and dry, no rash Neuro:  Alert and Oriented x 3, Strength and sensation are intact Psych: euthymic mood,  full affect  Wt Readings from Last 3 Encounters:  08/18/16 175 lb 12.8 oz (79.7 kg)      Studies/Labs Reviewed:   EKG:  EKG  Sinus bradycardia at 59 bpm with first-degree AV block and left axis deviation. The tracing is otherwise normal.  Recent Labs: No results found for requested labs within last 8760 hours.   Lipid Panel No results found for: CHOL, TRIG, HDL, CHOLHDL, VLDL, LDLCALC, LDLDIRECT  Additional studies/ records that were reviewed today include:   Holter monitor performed in May 2018 in : CONCLUSIONS: 1.Abnormal Holter monitor. Patient had very brief atrial runs and one episode of nonsustained ventricular tachycardia. This was a 4 beat run.. Asymptomatic. Average heart rate 60 bpm, minimum heart rate 42 bpm, and maximum heart rate 103 bpm  2.Arrhythmia as described above. 3.Symptoms were not reported   ECHOCARDIOGRAM performed 07/07/2016: EF 60-65%, moderate left ventricular hypertrophy, mild left atrial enlargement, aortic valve sclerosis, and trace MR and TR.  Lexiscan stress Myoview 01/22/2017: Inferior ischemia, ejection fraction 46%   ASSESSMENT:    1. First degree AV block   2. Orthostatic hypotension   3. Essential hypertension   4. Alzheimer's dementia without behavioral disturbance, unspecified timing of dementia onset   5. Type 2 diabetes mellitus without complication, with long-term current use of insulin ([place])   6. Dyslipidemia   7. Abnormal myocardial perfusion study      PLAN:  In order of problems listed above:  1. First-degree AV block requires no further evaluation especially in light of the recent Holter that demonstrates no significant high-grade AV block. 2. 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 I've asked him to discontinue lisinopril and to follow-up with Dr. Laqueta Due within the next 7-10 days. 3. The blood pressure is not a  significant issue currently and in fact is relatively low. Low work should be done to rule out volume contraction. 4. Not addressed 5. Not addressed 6. Not addressed 7. The nuclear study suggests the possibility of coronary disease versus a false positive test. In absence of anginal symptoms and given his dementia, I do not feel that further workup is necessary other than to take aspirin once per day.  We will attempt to get reports of the stress nuclear study and echocardiogram. I doubt that we will find much on those studies. I less some surprising findings are noted, no further cardiac follow-up will be necessary. His major issues seem to be dementia and diabetes.    Medication Adjustments/Labs and Tests Ordered: Current medicines are reviewed at length with the patient today.  Concerns regarding medicines are outlined above.  Medication changes, Labs and Tests ordered today  are listed in the Patient Instructions below. Patient Instructions  Medication Instructions:  1) STOP Lisinopril  Labwork: None  Testing/Procedures: None  Follow-Up: Your physician recommends that you schedule a follow-up appointment as needed with Dr. Tamala Julian.   Any Other Special Instructions Will Be Listed Below (If Applicable).  Please follow up with your Primary Care Physician in a week.    If you need a refill on your cardiac medications before your next appointment, please call your pharmacy.      Signed, Sinclair Grooms, MD  08/18/2016 5:47 PM    [address], [place], [place]  [address] Phone: [phone]; Fax: [phone]

## 2016-08-18 NOTE — Patient Instructions (Signed)
Medication Instructions:  1) STOP Lisinopril  Labwork: None  Testing/Procedures: None  Follow-Up: Your physician recommends that you schedule a follow-up appointment as needed with Dr. Tamala Julian.   Any Other Special Instructions Will Be Listed Below (If Applicable).  Please follow up with your Primary Care Physician in a week.    If you need a refill on your cardiac medications before your next appointment, please call your pharmacy.

## 2016-08-19 ENCOUNTER — Telehealth: Payer: Self-pay | Admitting: Interventional Cardiology

## 2016-08-19 NOTE — Telephone Encounter (Signed)
Release of Information faxed to [facility] [place].

## 2016-08-25 DIAGNOSIS — I959 Hypotension, unspecified: Secondary | ICD-10-CM | POA: Diagnosis not present

## 2016-08-25 DIAGNOSIS — M25552 Pain in left hip: Secondary | ICD-10-CM | POA: Diagnosis not present

## 2016-08-25 DIAGNOSIS — S79912A Unspecified injury of left hip, initial encounter: Secondary | ICD-10-CM | POA: Diagnosis not present

## 2016-08-25 DIAGNOSIS — F419 Anxiety disorder, unspecified: Secondary | ICD-10-CM | POA: Diagnosis not present

## 2016-09-01 DIAGNOSIS — R9439 Abnormal result of other cardiovascular function study: Secondary | ICD-10-CM | POA: Diagnosis not present

## 2016-09-01 DIAGNOSIS — M25552 Pain in left hip: Secondary | ICD-10-CM | POA: Diagnosis not present

## 2016-09-01 DIAGNOSIS — I10 Essential (primary) hypertension: Secondary | ICD-10-CM | POA: Diagnosis not present

## 2016-09-01 DIAGNOSIS — I951 Orthostatic hypotension: Secondary | ICD-10-CM | POA: Diagnosis not present

## 2016-09-01 DIAGNOSIS — F419 Anxiety disorder, unspecified: Secondary | ICD-10-CM | POA: Diagnosis not present

## 2016-09-01 DIAGNOSIS — G309 Alzheimer's disease, unspecified: Secondary | ICD-10-CM | POA: Diagnosis not present

## 2016-09-09 ENCOUNTER — Telehealth: Payer: Self-pay | Admitting: Interventional Cardiology

## 2016-09-09 NOTE — Telephone Encounter (Signed)
New message    Pt wife calling to schedule pt catherization. She has questions before scheduling this and would like more information.

## 2016-09-09 NOTE — Telephone Encounter (Signed)
Left message to call back  

## 2016-09-10 NOTE — Telephone Encounter (Signed)
Wife called back and states that PCP office was closed but she has left a message.  Went ahead and scheduled pt to see Dr. Tamala Julian on Monday and wife will call me tomorrow after speaking with PCP.  At that time she will let me know if they would like to proceed with plan for appt on Monday.

## 2016-09-10 NOTE — Telephone Encounter (Signed)
Spoke with wife and made her aware of information provided by Dr. Tamala Julian.  Wife states pt does not c/o CP or other cardiac issues, only hip pain.  She says pt has issue with dementia though so she's unsure if he communicates his pain correctly or not.  Advised we can bring pt in for appt if they would like to speak with Dr. Tamala Julian in regards to risks.  Wife would like to call pt's PCP and review with her and then will call me back.  Advised this would be fine and to call me back once she has contacted their office. Wife appreciative for call.

## 2016-09-10 NOTE — Telephone Encounter (Signed)
WE did not really discuss cardiac cath. Did not have all the data. Nuclear report suggests inferior wall blockage/ decreased blood flow but in absence of symptoms, we usually manage with medication. Cardiac cath is possible, has risk, and can be scheduled if they want to proceed.

## 2016-09-10 NOTE — Telephone Encounter (Signed)
F/u message  Pt call requesting to speak with RN. Please call back to discuss

## 2016-09-10 NOTE — Telephone Encounter (Signed)
Spoke with wife and she states that pt was recently seen by PCP and they mentioned that we decided not to do a heart cath since it was felt not needed at this time.  PCP told wife and pt that she would feel better if pt went on and proceeded with having this completed since he occasionally has pain in shoulder and elbow. Advised I would send message to Dr. Tamala Julian to see if he would want to proceed with cath.

## 2016-09-10 NOTE — Telephone Encounter (Signed)
Evan Vaughan is returning your call . Thanks

## 2016-09-11 NOTE — Telephone Encounter (Signed)
Spoke with wife and she states they did not hear back from PCP today.  Wife asked that I cancel appt for Monday and she will call back once they do speak with PCP and arrange an appt if needed.

## 2016-09-14 ENCOUNTER — Ambulatory Visit: Payer: Medicare Other | Admitting: Interventional Cardiology

## 2016-09-14 DIAGNOSIS — M25552 Pain in left hip: Secondary | ICD-10-CM | POA: Diagnosis not present

## 2016-09-14 DIAGNOSIS — M1612 Unilateral primary osteoarthritis, left hip: Secondary | ICD-10-CM | POA: Diagnosis not present

## 2016-09-22 DIAGNOSIS — E1165 Type 2 diabetes mellitus with hyperglycemia: Secondary | ICD-10-CM | POA: Diagnosis not present

## 2016-09-22 DIAGNOSIS — R9439 Abnormal result of other cardiovascular function study: Secondary | ICD-10-CM | POA: Diagnosis not present

## 2016-09-22 DIAGNOSIS — E785 Hyperlipidemia, unspecified: Secondary | ICD-10-CM | POA: Diagnosis not present

## 2016-09-22 DIAGNOSIS — F419 Anxiety disorder, unspecified: Secondary | ICD-10-CM | POA: Diagnosis not present

## 2016-09-22 DIAGNOSIS — G309 Alzheimer's disease, unspecified: Secondary | ICD-10-CM | POA: Diagnosis not present

## 2016-09-22 DIAGNOSIS — Z794 Long term (current) use of insulin: Secondary | ICD-10-CM | POA: Diagnosis not present

## 2016-10-19 DIAGNOSIS — L570 Actinic keratosis: Secondary | ICD-10-CM | POA: Diagnosis not present

## 2016-10-20 ENCOUNTER — Telehealth: Payer: Self-pay | Admitting: Interventional Cardiology

## 2016-10-20 NOTE — Telephone Encounter (Signed)
[facility] received back from [facility] stating they do not have records on this patient. Routed to [facility] [place].

## 2016-10-22 NOTE — Telephone Encounter (Signed)
Pt's echo and stress test are available in EPIC now for review.  Pt currently a PRN f/u as you wanted to see these tests first to decide whether pt needed further cardiac f/u.

## 2016-10-22 NOTE — Telephone Encounter (Signed)
Spoke with wife and scheduled pt to see Dr. Tamala Julian 11/03/16.

## 2016-10-22 NOTE — Telephone Encounter (Signed)
Yes, after review of nuclear results, I need OV to discuss results. If no symptoms, some time over next 4-6  weeks

## 2016-11-03 ENCOUNTER — Encounter: Payer: Self-pay | Admitting: Neurology

## 2016-11-03 ENCOUNTER — Ambulatory Visit (INDEPENDENT_AMBULATORY_CARE_PROVIDER_SITE_OTHER): Payer: Medicare Other | Admitting: Interventional Cardiology

## 2016-11-03 ENCOUNTER — Encounter: Payer: Self-pay | Admitting: Interventional Cardiology

## 2016-11-03 ENCOUNTER — Encounter (INDEPENDENT_AMBULATORY_CARE_PROVIDER_SITE_OTHER): Payer: Self-pay

## 2016-11-03 VITALS — BP 110/74 | HR 64 | Ht 73.0 in | Wt 177.8 lb

## 2016-11-03 DIAGNOSIS — R269 Unspecified abnormalities of gait and mobility: Secondary | ICD-10-CM | POA: Diagnosis not present

## 2016-11-03 DIAGNOSIS — Z794 Long term (current) use of insulin: Secondary | ICD-10-CM | POA: Diagnosis not present

## 2016-11-03 DIAGNOSIS — R29898 Other symptoms and signs involving the musculoskeletal system: Secondary | ICD-10-CM

## 2016-11-03 DIAGNOSIS — R413 Other amnesia: Secondary | ICD-10-CM

## 2016-11-03 DIAGNOSIS — I10 Essential (primary) hypertension: Secondary | ICD-10-CM | POA: Diagnosis not present

## 2016-11-03 DIAGNOSIS — I951 Orthostatic hypotension: Secondary | ICD-10-CM | POA: Diagnosis not present

## 2016-11-03 DIAGNOSIS — E119 Type 2 diabetes mellitus without complications: Secondary | ICD-10-CM

## 2016-11-03 DIAGNOSIS — R9439 Abnormal result of other cardiovascular function study: Secondary | ICD-10-CM

## 2016-11-03 DIAGNOSIS — I440 Atrioventricular block, first degree: Secondary | ICD-10-CM

## 2016-11-03 NOTE — Progress Notes (Signed)
Cardiology Office Note    Date:  11/03/2016   ID:  Abelino, Tippin 1934/11/07, MRN [id number]  PCP:  Ernestene Kiel, MD  Cardiologist: Sinclair Grooms, MD   Chief Complaint  Patient presents with  . Follow-up    First degree AV block    History of Present Illness:  Evan Vaughan is a 82 y.o. male  with abnormal myocardial perfusion imaging, First-degree AV block, long-standing diabetes mellitus type II, and dementia. Major concern over the past 6 months is decreased exertional tolerance, frequent falls, and leg weakness. The patient has no complaints.  Based upon reports and reviewing notes in Care Everywhere, the Holter monitor demonstrated premature beats and nonsustained VT. Please see report below. Maximum heart rate with activity 106 bpm.  Denies chest discomfort and dyspnea. Major complaint is leg weakness and extreme exertional fatigue.  A significant clinical problem is memory impairment. He is unable to give a reliable history in terms of symptoms. He feels he is doing okay. He still thinks that he is walking 1 mile daily for exercise.  Family spoke with her primary care physician who recommends that the patient have coronary angiography. I am not sure what this is based on other than concerned about the abnormal nuclear study. Despite significant chart review. The study was not graded based on level of risk.  Past Medical History:  Diagnosis Date  . Bruises easily   . Diabetes mellitus without complication ([place])     No past surgical history on file.  Current Medications: Outpatient Medications Prior to Visit  Medication Sig Dispense Refill  . ALPRAZolam (XANAX) 0.25 MG tablet Take 0.25 mg by mouth at bedtime.  0  . aspirin EC 81 MG tablet Take 81 mg by mouth daily.    . colesevelam (WELCHOL) 625 MG tablet Take one (1) tablet (625 mg) each morning and two (2) tablets (1250 mg) each evening.    . DUPIXENT 300 MG/2ML SOSY Take one (1) inject as  directed every two (2) weeks.    Marland Kitchen ezetimibe (ZETIA) 10 MG tablet Take 10 mg by mouth at bedtime.    Marland Kitchen FLUoxetine (PROZAC) 20 MG capsule Take 20 mg by mouth at bedtime. (Take with 40 mg = 60 mg total)  4  . FLUoxetine (PROZAC) 40 MG capsule Take 40 mg by mouth at bedtime. (Take with 20 mg = 60 mg total)  1  . hydrOXYzine (ATARAX/VISTARIL) 50 MG tablet Take 50 mg by mouth at bedtime as needed for itching.    Marland Kitchen LANTUS SOLOSTAR 100 UNIT/ML Solostar Pen Inject 25 Units into the skin 2 (two) times daily.  5  . omeprazole (PRILOSEC) 20 MG capsule Take 20 mg by mouth daily.     . pioglitazone (ACTOS) 15 MG tablet Take 15 mg by mouth every morning.    . divalproex (DEPAKOTE) 500 MG DR tablet Take 500 mg by mouth every morning.  4   No facility-administered medications prior to visit.      Allergies:   Patient has no known allergies.   Social History   Social History  . Marital status: Married    Spouse name: N/A  . Number of children: N/A  . Years of education: N/A   Social History Main Topics  . Smoking status: Never Smoker  . Smokeless tobacco: Never Used  . Alcohol use No  . Drug use: No  . Sexual activity: Not Asked   Other Topics Concern  . None  Social History Narrative  . None     Family History:  The patient's family history includes Diabetes in his other; Healthy in his son; Heart Problems in his other; Heart attack in his father; Heart disease in his mother; Hypertension in his father.   ROS:   Please see the history of present illness.    Anxiety, difficulty urinating, if noted with balance and walking, back pain, depression, shortness of breath with activity.  All other systems reviewed and are negative.   PHYSICAL EXAM:   VS:  BP 110/74   Pulse 64   Ht 6\' 1"  (1.854 m)   Wt 177 lb 12.8 oz (80.6 kg)   SpO2 96%   BMI 23.46 kg/m    GEN: Well nourished, well developed, in no acute distress  HEENT: normal  Neck: no JVD, carotid bruits, or masses Cardiac: RRR;  no murmurs, rubs, or gallops,. Pedal pulses are absent bilaterally. Femoral and popliteal pulses are 2+. No bruits are heard. Respiratory:  clear to auscultation bilaterally, normal work of breathing GI: soft, nontender, nondistended, + BS MS: no deformity or atrophy  Skin: warm and dry, no rash Neuro:  Alert and Oriented x 2 with significant difficulty remembering activities performed yesterday, Psych: Bland affect, calm  Wt Readings from Last 3 Encounters:  11/03/16 177 lb 12.8 oz (80.6 kg)  08/18/16 175 lb 12.8 oz (79.7 kg)      Studies/Labs Reviewed:   EKG:  EKG  No new data  Recent Labs: No results found for requested labs within last 8760 hours.   Lipid Panel No results found for: CHOL, TRIG, HDL, CHOLHDL, VLDL, LDLCALC, LDLDIRECT  Additional studies/ records that were reviewed today include:  48 hour Holter monitor 06/03/16: Indication: Weakness  Duration: 48 hours  Heart rhythm:  Hear rate: average 60 minimum 42 Maximum 103  Rhythm Findings:  1.Ventricular: Occasional PVCs, 13 couplets and one episode of nonsustained ventricular tachycardia. 4 beat run.  2.Supraventricular: rare PACs and very brief atrial runs  3.Bradyarrhythmias and Pauses: Nonsignificant. Patient had a heart rate of 42 at 5:51 AM  Symptoms reported:None  CONCLUSIONS: 1.Abnormal Holter monitor. Patient had very brief atrial runs and one episode of nonsustained ventricular tachycardia. This was a 4 beat run.. Asymptomatic 2.Arrhythmia as described above. 3.Symptoms were not reported    ECHOCARDIOGRAM performed 07/07/2016: EF 60-65%, moderate left ventricular hypertrophy, mild left atrial enlargement, aortic valve sclerosis, and trace MR and TR.  Lexiscan stress Myoview 07/23/2016: Inferior ischemia, ejection fraction 46%  ASSESSMENT:    1. Abnormal myocardial perfusion study   2. First degree AV block   3. Leg fatigue   4. Orthostatic hypotension   5.  Essential hypertension   6. Type 2 diabetes mellitus without complication, with long-term current use of insulin ([place])   7. Gait abnormality   8. Memory difficulty      PLAN:  In order of problems listed above:  1. The myocardial perfusion study was not graded. It simply quoted "inferior ischemia". Prior echo demonstrated normal LV size and function. Not sure any of the patient's current concerns, especially leg weakness and fatigue, have anything to do with his heart. I discussed cardiac catheterization in detail including the nature of the procedure, the one in 1000 risk of serious complications including stroke, death, myocardial infarction as well as the more common risk of bleeding/hematoma/potential kidney injury from contrast exposure. They understand the procedure. The wife is had prior angiography. We may yet perform coronary angiography but for  the time being, we'll put it on the back burner until we evaluate lower extremity fatigue with Doppler studies. 2. First-degree AV block noted on Holter monitor but no evidence of higher grade AV block. I do note that the peak heart rate on the monitor was 108 bpm. This leaves open the possibility that chronotropic incompetence is influencing the patient's exertional tolerance. 3. Absent pulses and in the presence of possible coronary disease, the exertional leg fatigue may be related to PAD. Bilateral lower extremity Doppler study an ABI has been ordered. 4. Orthostasis is improved. Blood pressure is pretty well controlled without significant antihypertensive therapy. Orthostatic dizziness/near fainting has resolved. 5. Not currently a clinical problem. 6. Not addressed other than as it pertains to coronary disease and PAD. 7. Possibly neurologic in etiology. Rule out circulatory component. 8. Needs follow-up with neurology.  We will assess whether or not he has PAD. We will have him reevaluated by neurology. Cardiac catheterization is still on  the table but does not seem to be a pressing issue clinically. We will further discuss. Clinical follow-up in 4 months.  Medication Adjustments/Labs and Tests Ordered: Current medicines are reviewed at length with the patient today.  Concerns regarding medicines are outlined above.  Medication changes, Labs and Tests ordered today are listed in the Patient Instructions below. Patient Instructions  Medication Instructions:  Your physician recommends that you continue on your current medications as directed. Please refer to the Current Medication list given to you today.  Labwork: None  Testing/Procedures: Your physician has requested that you have a lower or upper extremity arterial duplex. This test is an ultrasound of the arteries in the legs or arms. It looks at arterial blood flow in the legs and arms. Allow one hour for Lower and Upper Arterial scans. There are no restrictions or special instructions   Follow-Up: Your physician wants you to follow-up in: 4 months with Dr. Tamala Julian.  You will receive a reminder letter in the mail two months in advance. If you don't receive a letter, please call our office to schedule the follow-up appointment.   Any Other Special Instructions Will Be Listed Below (If Applicable).  You have been referred to Neurology.    If you need a refill on your cardiac medications before your next appointment, please call your pharmacy.      Signed, Sinclair Grooms, MD  11/03/2016 12:26 PM    [address], [place], [place]  [address] Phone: [phone]; Fax: [phone]

## 2016-11-03 NOTE — Patient Instructions (Signed)
Medication Instructions:  Your physician recommends that you continue on your current medications as directed. Please refer to the Current Medication list given to you today.  Labwork: None  Testing/Procedures: Your physician has requested that you have a lower or upper extremity arterial duplex. This test is an ultrasound of the arteries in the legs or arms. It looks at arterial blood flow in the legs and arms. Allow one hour for Lower and Upper Arterial scans. There are no restrictions or special instructions   Follow-Up: Your physician wants you to follow-up in: 4 months with Dr. Tamala Julian.  You will receive a reminder letter in the mail two months in advance. If you don't receive a letter, please call our office to schedule the follow-up appointment.   Any Other Special Instructions Will Be Listed Below (If Applicable).  You have been referred to Neurology.    If you need a refill on your cardiac medications before your next appointment, please call your pharmacy.

## 2016-11-11 ENCOUNTER — Other Ambulatory Visit: Payer: Self-pay | Admitting: Interventional Cardiology

## 2016-11-11 ENCOUNTER — Encounter (HOSPITAL_COMMUNITY): Payer: Medicare Other

## 2016-11-11 DIAGNOSIS — R29898 Other symptoms and signs involving the musculoskeletal system: Secondary | ICD-10-CM

## 2016-11-14 DIAGNOSIS — G309 Alzheimer's disease, unspecified: Secondary | ICD-10-CM | POA: Diagnosis not present

## 2016-11-14 DIAGNOSIS — E1165 Type 2 diabetes mellitus with hyperglycemia: Secondary | ICD-10-CM | POA: Diagnosis not present

## 2016-11-14 DIAGNOSIS — R079 Chest pain, unspecified: Secondary | ICD-10-CM | POA: Diagnosis not present

## 2016-11-14 DIAGNOSIS — F0280 Dementia in other diseases classified elsewhere without behavioral disturbance: Secondary | ICD-10-CM | POA: Diagnosis not present

## 2016-11-16 ENCOUNTER — Ambulatory Visit (HOSPITAL_COMMUNITY)
Admission: RE | Admit: 2016-11-16 | Discharge: 2016-11-16 | Disposition: A | Discharge status: Home / Self Care | Payer: Medicare Other | Source: Ambulatory Visit | Attending: Cardiology | Admitting: Cardiology

## 2016-11-16 ENCOUNTER — Other Ambulatory Visit: Payer: Self-pay | Admitting: Interventional Cardiology

## 2016-11-16 DIAGNOSIS — R29898 Other symptoms and signs involving the musculoskeletal system: Secondary | ICD-10-CM

## 2016-11-16 DIAGNOSIS — R0989 Other specified symptoms and signs involving the circulatory and respiratory systems: Secondary | ICD-10-CM | POA: Diagnosis not present

## 2016-11-18 ENCOUNTER — Ambulatory Visit (INDEPENDENT_AMBULATORY_CARE_PROVIDER_SITE_OTHER): Payer: Medicare Other | Admitting: Neurology

## 2016-11-18 ENCOUNTER — Encounter: Payer: Self-pay | Admitting: Neurology

## 2016-11-18 VITALS — BP 120/58 | HR 70 | Ht 73.0 in | Wt 179.0 lb

## 2016-11-18 DIAGNOSIS — R292 Abnormal reflex: Secondary | ICD-10-CM

## 2016-11-18 DIAGNOSIS — R296 Repeated falls: Secondary | ICD-10-CM | POA: Diagnosis not present

## 2016-11-18 DIAGNOSIS — F0390 Unspecified dementia without behavioral disturbance: Secondary | ICD-10-CM

## 2016-11-18 DIAGNOSIS — R413 Other amnesia: Secondary | ICD-10-CM | POA: Diagnosis not present

## 2016-11-18 DIAGNOSIS — G20 Parkinson's disease: Secondary | ICD-10-CM | POA: Diagnosis not present

## 2016-11-18 DIAGNOSIS — G20A1 Parkinson's disease without dyskinesia, without mention of fluctuations: Secondary | ICD-10-CM

## 2016-11-18 DIAGNOSIS — R2681 Unsteadiness on feet: Secondary | ICD-10-CM

## 2016-11-18 DIAGNOSIS — F03A0 Unspecified dementia, mild, without behavioral disturbance, psychotic disturbance, mood disturbance, and anxiety: Secondary | ICD-10-CM

## 2016-11-18 MED ORDER — CARBIDOPA-LEVODOPA 25-100 MG PO TABS: ORAL_TABLET | ORAL | 5 refills | Status: DC

## 2016-11-18 NOTE — Patient Instructions (Addendum)
1. Schedule MRI cervical spine without contrast 2. Schedule MRI lumbar spine without contrast  We have sent a referral to [facility] for your MRI and they will call you directly to schedule your appt. They are located at [place]. If you need to contact them directly please call [phone].   3. Refer to Physical Therapy in [place] for leg strengthening and balance 4. Start Sinemet 25/100mg : Take 1/2 tablet three times a day before meals 5. Follow-up in 4-5 months, call for any changes

## 2016-11-18 NOTE — Progress Notes (Signed)
NEUROLOGY CONSULTATION NOTE  Evan Vaughan MRN: [id number] DOB: 10-Jun-1934  Referring provider: Dr. Daneen Schick  Primary care provider:  Dr. Ernestene Kiel  Reason for consult:  Gait/memory  Dear Dr Tamala Julian:  Thank you for your kind referral of Evan Vaughan for consultation of the above symptoms. Although his history is well known to you, please allow me to reiterate it for the purpose of our medical record. The patient was accompanied to the clinic by his wife who also provides collateral information. Records and images were personally reviewed where available.  HISTORY OF PRESENT ILLNESS: This is a 82 year old right-handed man with a history of diabetes, hypertension, prior stroke, dementia, presenting for evaluation of gait and memory issues. His wife provides majority of history. He feels his memory and walking are pretty good. He states he used to walk a lot more but just gets slack on it, and denies any weaknes in his legs. He feels his memory is pretty good, he denies getting lost driving, but his wife reports she does most of the driving. His wife is in charge of medications and finances. He lives with his wife and grandson, he is independent with dressing and bathing. His wife started noticing memory changes around 1.5 years ago, he was previously prescribed Aricept and Namenda, which caused side effects. He has been on the Exelon patch for the past 6-12 months, which has helped quite a bit. Dose was increased because of mood issues ("he was angry"), which helped. They deny hallucinations, but he has a lot of "crazy dreams." No REM behavior disorder, but he talks a lot and is restless in his sleep. His wife started noticing gait changes around 1-2 years ago, she reports he drags his feet, shuffling and sliding feet. He has fallen several times, one time 3-4 months ago he fell against the commode and knocked off the screws. His last fall was just this week, he got up at night and  fell. His wife reports his legs are weak, he used to walk 1 mile three times a week until a year ago. Now after walking 20 minutes, he falls into his chair and is worn out. He has had a tremor for the past few months. He denies any headaches, dizziness, diplopia, dysarthria/dysphagia, neck pain, bowel/bladder dysfunction, focal numbness/tingling, anosmia. He has chronic back pain.   Records from his prior neurologist at [facility] were reviewed. He was initially seen in July 2017, previous to this he was followed at [facility]. 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 He started repeating himself. He had an MRI brain around 09/2014 which per documentation showed a progressive increase in biventricular diameter since 2007 without accompanying significant cortical atrophy. There was also a remote right occipital infarct with slight volume loss and gliosis. In 10/2014, he had an LP which was originally intended to be high volume to assess for NPH, but per documentation was only able to obtain 10mL of CSF (uncertain why) with an opening pressure of 13.5 cm H2O, reported to have elevated protein. He worked until June 2017.  He was noted to have a mild right>left UE tremor. He had a repeat MRI brain 08/2015 which did not show any acute changes. THere was scattered bilateral patchy areas of periventricular and subcortical white matter signal, likely representing chronic microvascular changes. This is associated with mils cerebral volume atrophy and ex vacuo dilation of the  ventricles. There were small remote infarcts in the right posterior frontal lobe and right occipital lobe. Family reported worsening balance on his last visit in November 2017. They reported several falls. His prior neurologist tried to taper off Xanax, but family restarted it due to increased agitation primarily at night.  Laboratory Data: 09/2014: TSH and B12,  folate normal. RPR nonreactive.  PAST MEDICAL HISTORY: Past Medical History:  Diagnosis Date  . Bruises easily   . Diabetes mellitus without complication ([place])     PAST SURGICAL HISTORY: No past surgical history on file.  MEDICATIONS: Current Outpatient Prescriptions on File Prior to Visit  Medication Sig Dispense Refill  . ALPRAZolam (XANAX) 0.25 MG tablet Take 0.25 mg by mouth at bedtime.  0  . aspirin EC 81 MG tablet Take 81 mg by mouth daily.    . colesevelam (WELCHOL) 625 MG tablet Take one (1) tablet (625 mg) each morning and two (2) tablets (1250 mg) each evening.    . DUPIXENT 300 MG/2ML SOSY Take one (1) inject as directed every two (2) weeks.    Marland Kitchen ezetimibe (ZETIA) 10 MG tablet Take 10 mg by mouth at bedtime.    Marland Kitchen FLUoxetine (PROZAC) 20 MG capsule Take 20 mg by mouth at bedtime. (Take with 40 mg = 60 mg total)  4  . FLUoxetine (PROZAC) 40 MG capsule Take 40 mg by mouth at bedtime. (Take with 20 mg = 60 mg total)  1  . hydrOXYzine (ATARAX/VISTARIL) 50 MG tablet Take 50 mg by mouth at bedtime as needed for itching.    Marland Kitchen LANTUS SOLOSTAR 100 UNIT/ML Solostar Pen Inject 25 Units into the skin 2 (two) times daily.  5  . omeprazole (PRILOSEC) 20 MG capsule Take 20 mg by mouth daily.     . pioglitazone (ACTOS) 15 MG tablet Take 15 mg by mouth every morning.     No current facility-administered medications on file prior to visit.     ALLERGIES: No Known Allergies  FAMILY HISTORY: Family History  Problem Relation Age of Onset  . Heart disease Mother   . Hypertension Father   . Heart attack Father   . Healthy Son   . Diabetes Other        sibling  . Heart Problems Other        sibling    SOCIAL HISTORY: Social History   Social History  . Marital status: Married    Spouse name: N/A  . Number of children: N/A  . Years of education: N/A   Occupational History  . Not on file.   Social History Main Topics  . Smoking status: Never Smoker  . Smokeless tobacco:  Never Used  . Alcohol use No  . Drug use: No  . Sexual activity: Not on file   Other Topics Concern  . Not on file   Social History Narrative  . No narrative on file    REVIEW OF SYSTEMS: Constitutional: No fevers, chills, or sweats, no generalized fatigue, change in appetite Eyes: No visual changes, double vision, eye pain Ear, nose and throat: No hearing loss, ear pain, nasal congestion, sore throat Cardiovascular: No chest pain, palpitations Respiratory:  No shortness of breath at rest or with exertion, wheezes GastrointestinaI: No nausea, vomiting, diarrhea, abdominal pain, fecal incontinence Genitourinary:  No dysuria, urinary retention or frequency Musculoskeletal:  No neck pain, back pain Integumentary: No rash, pruritus, skin lesions Neurological: as above Psychiatric: No depression, insomnia, anxiety Endocrine: No palpitations, fatigue, diaphoresis, mood swings,  change in appetite, change in weight, increased thirst Hematologic/Lymphatic:  No anemia, purpura, petechiae. Allergic/Immunologic: no itchy/runny eyes, nasal congestion, recent allergic reactions, rashes  PHYSICAL EXAM: Vitals:   11/18/16 1405  BP: (!) 120/58  Pulse: 70  SpO2: 97%   General: No acute distress Head:  Normocephalic/atraumatic Eyes: Fundoscopic exam shows bilateral sharp discs, no vessel changes, exudates, or hemorrhages Neck: supple, no paraspinal tenderness, full range of motion Back: No paraspinal tenderness Heart: regular rate and rhythm Lungs: Clear to auscultation bilaterally. Vascular: No carotid bruits. Skin/Extremities: No rash, no edema Neurological Exam: Mental status: alert and oriented to person, place, and day/season/year (states it is June), no dysarthria or aphasia, Fund of knowledge is appropriate.  Recent and remote memory are impaired.  Attention and concentration are reduced, spelled WORLD backwards as DROLD.    Able to name objects, difficulty with repetition. CDT  4/5 MMSE - Mini Mental State Exam 11/18/2016  Orientation to time 3  Orientation to Place 5  Registration 3  Attention/ Calculation 1  Recall 0  Language- name 2 objects 2  Language- repeat 0  Language- follow 3 step command 3  Language- read & follow direction 1  Write a sentence 1  Copy design 1  Total score 20   Cranial nerves: CN I: not tested CN II: pupils equal, round and reactive to light, visual fields intact, fundi unremarkable. CN III, IV, VI:  full range of motion, no nystagmus, no ptosis CN V: facial sensation intact CN VII: upper and lower face symmetric CN VIII: hearing intact to finger rub CN IX, X: gag intact, uvula midline CN XI: sternocleidomastoid and trapezius muscles intact CN XII: tongue midline Bulk & Tone: normal, no cogwheeling, no fasciculations. Motor: 5/5 throughout with no pronator drift. Sensation: intact to light touch, cold, pin on both UE, decreased cold on left foot, decreased vibration to ankles bilaterally, intact pin on both LE.  No extinction to double simultaneous stimulation.  Romberg test +sway Deep Tendon Reflexes: brisk +3 left UE and LE, +2 right UE, patella, +1 bilateral ankle jerks, no ankle clonus Plantar responses: upgoing toe on left, downgoing on right Cerebellar: no incoordination on finger to nose testing Gait: slightly wide-based with right hand tremor noted on ambulation (not shuffling or magnetic, per wife this is a good day) Tremor: +head tremor, no resting tremor, +postural tremor R>L +postural instability, +bradykinesia with decreased finger taps bilaterally, fair toe tapping  IMPRESSION: This is a 82 year old right-handed man with a history of diabetes, hypertension, prior stroke, dementia, presenting for evaluation of gait and memory issues. Memory changes started around 2016, gait changes more noticeable in the past year. MRI brain in 2016 raised question of NPH, however repeat MRI brain in 2017 reported ex vacuo  dilation of ventricles with mild atrophy. His MMSE today is 20/30, indicating mild dementia. Neurological exam shows brisk reflexes and upgoing toe on left, prior imaging had shown old strokes on the right side, however MRI cervical spine without contrast will be ordered to assess for spinal stenosis. He does not have a shuffling or magnetic gait but is noted to have a tremor on the right hand with ambulation, postural instability, and bradykinesia, concerning for Parkinson's disease. MRI lumbar spine will be ordered to assess for structural abnormality potentially contributing to gait as well. His wife is agreeable to a trial of low dose Sinemet and assess gait, side effects were discussed. He would benefit from PT for leg strengthening and balance therapy, referral  will be sent. He will follow-up in 4-5 months.   Thank you for allowing me to participate in the care of this patient. Please do not hesitate to call for any questions or concerns.   Ellouise Newer, M.D.  CC: Dr. Tamala Julian, Dr. Laqueta Due

## 2016-11-23 DIAGNOSIS — Z794 Long term (current) use of insulin: Secondary | ICD-10-CM | POA: Diagnosis not present

## 2016-11-23 DIAGNOSIS — G20 Parkinson's disease: Secondary | ICD-10-CM | POA: Diagnosis not present

## 2016-11-23 DIAGNOSIS — N401 Enlarged prostate with lower urinary tract symptoms: Secondary | ICD-10-CM | POA: Diagnosis not present

## 2016-11-23 DIAGNOSIS — I10 Essential (primary) hypertension: Secondary | ICD-10-CM | POA: Diagnosis not present

## 2016-11-23 DIAGNOSIS — Z79899 Other long term (current) drug therapy: Secondary | ICD-10-CM | POA: Diagnosis not present

## 2016-11-23 DIAGNOSIS — E785 Hyperlipidemia, unspecified: Secondary | ICD-10-CM | POA: Diagnosis not present

## 2016-11-23 DIAGNOSIS — E1165 Type 2 diabetes mellitus with hyperglycemia: Secondary | ICD-10-CM | POA: Diagnosis not present

## 2016-11-24 ENCOUNTER — Telehealth: Payer: Self-pay

## 2016-11-24 NOTE — Telephone Encounter (Signed)
Received after hours message stating that pt's dementia was "acting up".  Per note, Dr. Delice Lesch spoke with triage nurse and advised pt's wife to hold off on giving pt Sinemet.  If worsening symptom to go to ER. No further notes.

## 2016-12-02 ENCOUNTER — Ambulatory Visit
Admission: RE | Admit: 2016-12-02 | Discharge: 2016-12-02 | Disposition: A | Discharge status: Home / Self Care | Payer: Medicare Other | Source: Ambulatory Visit | Attending: Neurology | Admitting: Neurology

## 2016-12-02 DIAGNOSIS — F03A0 Unspecified dementia, mild, without behavioral disturbance, psychotic disturbance, mood disturbance, and anxiety: Secondary | ICD-10-CM

## 2016-12-02 DIAGNOSIS — R296 Repeated falls: Secondary | ICD-10-CM

## 2016-12-02 DIAGNOSIS — F0390 Unspecified dementia without behavioral disturbance: Secondary | ICD-10-CM

## 2016-12-02 DIAGNOSIS — R413 Other amnesia: Secondary | ICD-10-CM

## 2016-12-02 DIAGNOSIS — R2681 Unsteadiness on feet: Secondary | ICD-10-CM

## 2016-12-02 DIAGNOSIS — G20 Parkinson's disease: Secondary | ICD-10-CM

## 2016-12-02 DIAGNOSIS — R292 Abnormal reflex: Secondary | ICD-10-CM

## 2016-12-02 DIAGNOSIS — S3992XA Unspecified injury of lower back, initial encounter: Secondary | ICD-10-CM | POA: Diagnosis not present

## 2016-12-02 DIAGNOSIS — G20A1 Parkinson's disease without dyskinesia, without mention of fluctuations: Secondary | ICD-10-CM

## 2016-12-02 DIAGNOSIS — S199XXA Unspecified injury of neck, initial encounter: Secondary | ICD-10-CM | POA: Diagnosis not present

## 2016-12-08 ENCOUNTER — Telehealth: Payer: Self-pay | Admitting: Neurology

## 2016-12-08 ENCOUNTER — Other Ambulatory Visit: Payer: Self-pay

## 2016-12-08 DIAGNOSIS — G20 Parkinson's disease: Secondary | ICD-10-CM

## 2016-12-08 NOTE — Telephone Encounter (Signed)
Discussed MRI findings with wife. There were several changes seen, including multilevel degenerative cervical spinal stenosis with up to moderate spinal cord mass effect at C3-4 through C6-7, no spinal cord myelomalacia seen. There was moderate and severe multifactorial cervical neural foraminal stenosis seen. 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 There is a degree of chronic brainstem atrophy also suspected. His MRI lumbar spine also showed severe spinal and right lateral recess stenosis at L4-5, mild spinal stenosis at L2-3 and L3-4.   Discussed with her that the spinal stenosis explains the hyperreflexia noted on exam, and all of the above findings could contribute to gait instability. On his visit, he also had signs of parkinsonism (tremor, postural instability, bradykinesia), and was started on low dose Sinemet 1/2 tab TID. His wife initially was concerned he was more confused, but this has stabilized, and she thinks this is actually helping, he is doing good and has not fallen recently.  We discussed proceeding with physical therapy for the spinal stenosis and parkinsonism to help with balance. We discussed the occluded vertebral artery, there is flow through the right vertebral artery and no evidence of stroke, continue daily aspirin, and continue control of vascular risk factors, particularly lipid control. They will discuss this with PCP.

## 2016-12-08 NOTE — Telephone Encounter (Signed)
Meagen, wife has not heard yet from Physical Therapy, can you pls f/u and give her number to call. Also, pls send my phone note above to her PCP, Dr. Laqueta Due as an fyi, he has an appointment this week. Thanks!

## 2016-12-14 DIAGNOSIS — M6281 Muscle weakness (generalized): Secondary | ICD-10-CM | POA: Diagnosis not present

## 2016-12-14 DIAGNOSIS — R2689 Other abnormalities of gait and mobility: Secondary | ICD-10-CM | POA: Diagnosis not present

## 2016-12-15 DIAGNOSIS — R7989 Other specified abnormal findings of blood chemistry: Secondary | ICD-10-CM | POA: Diagnosis not present

## 2016-12-18 DIAGNOSIS — R2689 Other abnormalities of gait and mobility: Secondary | ICD-10-CM | POA: Diagnosis not present

## 2016-12-18 DIAGNOSIS — M6281 Muscle weakness (generalized): Secondary | ICD-10-CM | POA: Diagnosis not present

## 2016-12-21 DIAGNOSIS — R2689 Other abnormalities of gait and mobility: Secondary | ICD-10-CM | POA: Diagnosis not present

## 2016-12-21 DIAGNOSIS — M6281 Muscle weakness (generalized): Secondary | ICD-10-CM | POA: Diagnosis not present

## 2016-12-25 DIAGNOSIS — M6281 Muscle weakness (generalized): Secondary | ICD-10-CM | POA: Diagnosis not present

## 2016-12-25 DIAGNOSIS — R2689 Other abnormalities of gait and mobility: Secondary | ICD-10-CM | POA: Diagnosis not present

## 2016-12-28 DIAGNOSIS — R2689 Other abnormalities of gait and mobility: Secondary | ICD-10-CM | POA: Diagnosis not present

## 2016-12-28 DIAGNOSIS — M6281 Muscle weakness (generalized): Secondary | ICD-10-CM | POA: Diagnosis not present

## 2016-12-31 DIAGNOSIS — M6281 Muscle weakness (generalized): Secondary | ICD-10-CM | POA: Diagnosis not present

## 2016-12-31 DIAGNOSIS — R2689 Other abnormalities of gait and mobility: Secondary | ICD-10-CM | POA: Diagnosis not present

## 2017-01-04 DIAGNOSIS — R2689 Other abnormalities of gait and mobility: Secondary | ICD-10-CM | POA: Diagnosis not present

## 2017-01-04 DIAGNOSIS — M6281 Muscle weakness (generalized): Secondary | ICD-10-CM | POA: Diagnosis not present

## 2017-01-15 DIAGNOSIS — R2689 Other abnormalities of gait and mobility: Secondary | ICD-10-CM | POA: Diagnosis not present

## 2017-01-15 DIAGNOSIS — M6281 Muscle weakness (generalized): Secondary | ICD-10-CM | POA: Diagnosis not present

## 2017-01-18 DIAGNOSIS — R2689 Other abnormalities of gait and mobility: Secondary | ICD-10-CM | POA: Diagnosis not present

## 2017-01-18 DIAGNOSIS — M6281 Muscle weakness (generalized): Secondary | ICD-10-CM | POA: Diagnosis not present

## 2017-01-21 DIAGNOSIS — R2689 Other abnormalities of gait and mobility: Secondary | ICD-10-CM | POA: Diagnosis not present

## 2017-01-21 DIAGNOSIS — M6281 Muscle weakness (generalized): Secondary | ICD-10-CM | POA: Diagnosis not present

## 2017-01-28 DIAGNOSIS — R2689 Other abnormalities of gait and mobility: Secondary | ICD-10-CM | POA: Diagnosis not present

## 2017-01-28 DIAGNOSIS — M6281 Muscle weakness (generalized): Secondary | ICD-10-CM | POA: Diagnosis not present

## 2017-02-01 DIAGNOSIS — M6281 Muscle weakness (generalized): Secondary | ICD-10-CM | POA: Diagnosis not present

## 2017-02-01 DIAGNOSIS — R2689 Other abnormalities of gait and mobility: Secondary | ICD-10-CM | POA: Diagnosis not present

## 2017-02-04 DIAGNOSIS — R2689 Other abnormalities of gait and mobility: Secondary | ICD-10-CM | POA: Diagnosis not present

## 2017-02-04 DIAGNOSIS — M6281 Muscle weakness (generalized): Secondary | ICD-10-CM | POA: Diagnosis not present

## 2017-02-08 DIAGNOSIS — M6281 Muscle weakness (generalized): Secondary | ICD-10-CM | POA: Diagnosis not present

## 2017-02-08 DIAGNOSIS — R2689 Other abnormalities of gait and mobility: Secondary | ICD-10-CM | POA: Diagnosis not present

## 2017-02-09 ENCOUNTER — Inpatient Hospital Stay (HOSPITAL_COMMUNITY)
Admission: EM | Admit: 2017-02-09 | Discharge: 2017-02-11 | DRG: 392 | Disposition: A | Discharge status: Home Health | Payer: Medicare Other | Attending: Internal Medicine | Admitting: Internal Medicine

## 2017-02-09 ENCOUNTER — Emergency Department (HOSPITAL_COMMUNITY): Payer: Medicare Other

## 2017-02-09 ENCOUNTER — Encounter (HOSPITAL_COMMUNITY): Payer: Self-pay | Admitting: Internal Medicine

## 2017-02-09 DIAGNOSIS — R509 Fever, unspecified: Secondary | ICD-10-CM | POA: Diagnosis not present

## 2017-02-09 DIAGNOSIS — E860 Dehydration: Secondary | ICD-10-CM | POA: Diagnosis present

## 2017-02-09 DIAGNOSIS — I6523 Occlusion and stenosis of bilateral carotid arteries: Secondary | ICD-10-CM | POA: Diagnosis not present

## 2017-02-09 DIAGNOSIS — R410 Disorientation, unspecified: Secondary | ICD-10-CM | POA: Diagnosis present

## 2017-02-09 DIAGNOSIS — Z79899 Other long term (current) drug therapy: Secondary | ICD-10-CM

## 2017-02-09 DIAGNOSIS — Z794 Long term (current) use of insulin: Secondary | ICD-10-CM

## 2017-02-09 DIAGNOSIS — E119 Type 2 diabetes mellitus without complications: Secondary | ICD-10-CM

## 2017-02-09 DIAGNOSIS — Z8249 Family history of ischemic heart disease and other diseases of the circulatory system: Secondary | ICD-10-CM

## 2017-02-09 DIAGNOSIS — R0989 Other specified symptoms and signs involving the circulatory and respiratory systems: Secondary | ICD-10-CM | POA: Diagnosis not present

## 2017-02-09 DIAGNOSIS — E785 Hyperlipidemia, unspecified: Secondary | ICD-10-CM | POA: Diagnosis present

## 2017-02-09 DIAGNOSIS — R531 Weakness: Secondary | ICD-10-CM | POA: Diagnosis not present

## 2017-02-09 DIAGNOSIS — Y92238 Other place in hospital as the place of occurrence of the external cause: Secondary | ICD-10-CM | POA: Diagnosis present

## 2017-02-09 DIAGNOSIS — F0391 Unspecified dementia with behavioral disturbance: Secondary | ICD-10-CM | POA: Diagnosis present

## 2017-02-09 DIAGNOSIS — W19XXXA Unspecified fall, initial encounter: Secondary | ICD-10-CM | POA: Diagnosis present

## 2017-02-09 DIAGNOSIS — G459 Transient cerebral ischemic attack, unspecified: Secondary | ICD-10-CM | POA: Diagnosis present

## 2017-02-09 DIAGNOSIS — E1165 Type 2 diabetes mellitus with hyperglycemia: Secondary | ICD-10-CM | POA: Diagnosis not present

## 2017-02-09 DIAGNOSIS — G9389 Other specified disorders of brain: Secondary | ICD-10-CM | POA: Diagnosis present

## 2017-02-09 DIAGNOSIS — N183 Chronic kidney disease, stage 3 (moderate): Secondary | ICD-10-CM | POA: Diagnosis present

## 2017-02-09 DIAGNOSIS — T426X5A Adverse effect of other antiepileptic and sedative-hypnotic drugs, initial encounter: Secondary | ICD-10-CM | POA: Diagnosis present

## 2017-02-09 DIAGNOSIS — Z7984 Long term (current) use of oral hypoglycemic drugs: Secondary | ICD-10-CM

## 2017-02-09 DIAGNOSIS — I10 Essential (primary) hypertension: Secondary | ICD-10-CM | POA: Diagnosis present

## 2017-02-09 DIAGNOSIS — F0390 Unspecified dementia without behavioral disturbance: Secondary | ICD-10-CM | POA: Diagnosis not present

## 2017-02-09 DIAGNOSIS — E1122 Type 2 diabetes mellitus with diabetic chronic kidney disease: Secondary | ICD-10-CM | POA: Diagnosis not present

## 2017-02-09 DIAGNOSIS — A084 Viral intestinal infection, unspecified: Secondary | ICD-10-CM | POA: Diagnosis not present

## 2017-02-09 DIAGNOSIS — Z7982 Long term (current) use of aspirin: Secondary | ICD-10-CM

## 2017-02-09 DIAGNOSIS — I6789 Other cerebrovascular disease: Secondary | ICD-10-CM | POA: Diagnosis not present

## 2017-02-09 DIAGNOSIS — I129 Hypertensive chronic kidney disease with stage 1 through stage 4 chronic kidney disease, or unspecified chronic kidney disease: Secondary | ICD-10-CM | POA: Diagnosis not present

## 2017-02-09 DIAGNOSIS — R269 Unspecified abnormalities of gait and mobility: Secondary | ICD-10-CM | POA: Diagnosis not present

## 2017-02-09 DIAGNOSIS — D631 Anemia in chronic kidney disease: Secondary | ICD-10-CM | POA: Diagnosis present

## 2017-02-09 DIAGNOSIS — F03918 Unspecified dementia, unspecified severity, with other behavioral disturbance: Secondary | ICD-10-CM | POA: Diagnosis present

## 2017-02-09 DIAGNOSIS — G20 Parkinson's disease: Secondary | ICD-10-CM

## 2017-02-09 DIAGNOSIS — F329 Major depressive disorder, single episode, unspecified: Secondary | ICD-10-CM | POA: Diagnosis present

## 2017-02-09 DIAGNOSIS — R4781 Slurred speech: Secondary | ICD-10-CM | POA: Diagnosis not present

## 2017-02-09 DIAGNOSIS — R29818 Other symptoms and signs involving the nervous system: Secondary | ICD-10-CM | POA: Diagnosis not present

## 2017-02-09 DIAGNOSIS — F419 Anxiety disorder, unspecified: Secondary | ICD-10-CM | POA: Diagnosis present

## 2017-02-09 DIAGNOSIS — R4182 Altered mental status, unspecified: Secondary | ICD-10-CM | POA: Diagnosis not present

## 2017-02-09 LAB — CBC
HCT: 41.7 % (ref 39.0–52.0)
Hemoglobin: 13.2 g/dL (ref 13.0–17.0)
MCH: 28.1 pg (ref 26.0–34.0)
MCHC: 31.7 g/dL (ref 30.0–36.0)
MCV: 88.7 fL (ref 78.0–100.0)
PLATELETS: 198 10*3/uL (ref 150–400)
RBC: 4.7 MIL/uL (ref 4.22–5.81)
RDW: 14.7 % (ref 11.5–15.5)
WBC: 12.2 10*3/uL — AB (ref 4.0–10.5)

## 2017-02-09 LAB — ETHANOL: Alcohol, Ethyl (B): <10 mg/dL (ref <10)

## 2017-02-09 LAB — URINALYSIS, ROUTINE W REFLEX MICROSCOPIC
BACTERIA UA: NONE SEEN
Bilirubin Urine: NEGATIVE
Glucose, UA: >=500 mg/dL — AB
Hgb urine dipstick: NEGATIVE
Ketones, ur: NEGATIVE mg/dL
Leukocytes, UA: NEGATIVE
Nitrite: NEGATIVE
Protein, ur: NEGATIVE mg/dL
Squamous Epithelial / LPF: NONE SEEN
WBC UA: NONE SEEN WBC/hpf (ref 0–5)
pH: 6 (ref 5.0–8.0)

## 2017-02-09 LAB — I-STAT CHEM 8, ED
BUN: 26 mg/dL — AB (ref 6–20)
CHLORIDE: 97 mmol/L — AB (ref 101–111)
Calcium, Ion: 1.08 mmol/L — ABNORMAL LOW (ref 1.15–1.40)
Creatinine, Ser: 1.3 mg/dL — ABNORMAL HIGH (ref 0.61–1.24)
Glucose, Bld: 244 mg/dL — ABNORMAL HIGH (ref 65–99)
HEMATOCRIT: 44 % (ref 39.0–52.0)
Hemoglobin: 15 g/dL (ref 13.0–17.0)
POTASSIUM: 4.4 mmol/L (ref 3.5–5.1)
SODIUM: 136 mmol/L (ref 135–145)
TCO2: 23 mmol/L (ref 22–32)

## 2017-02-09 LAB — COMPREHENSIVE METABOLIC PANEL
ALBUMIN: 3.5 g/dL (ref 3.5–5.0)
ALT: 27 U/L (ref 17–63)
ANION GAP: 11 (ref 5–15)
AST: 30 U/L (ref 15–41)
Alkaline Phosphatase: 72 U/L (ref 38–126)
BUN: 23 mg/dL — ABNORMAL HIGH (ref 6–20)
CHLORIDE: 98 mmol/L — AB (ref 101–111)
CO2: 23 mmol/L (ref 22–32)
Calcium: 8.5 mg/dL — ABNORMAL LOW (ref 8.9–10.3)
Creatinine, Ser: 1.49 mg/dL — ABNORMAL HIGH (ref 0.61–1.24)
GFR calc non Af Amer: 43 mL/min — ABNORMAL LOW (ref >60)
GFR, EST AFRICAN AMERICAN: 49 mL/min — AB (ref >60)
GLUCOSE: 248 mg/dL — AB (ref 65–99)
Potassium: 4.4 mmol/L (ref 3.5–5.1)
SODIUM: 132 mmol/L — AB (ref 135–145)
Total Bilirubin: 1 mg/dL (ref 0.3–1.2)
Total Protein: 6.3 g/dL — ABNORMAL LOW (ref 6.5–8.1)

## 2017-02-09 LAB — CBG MONITORING, ED: GLUCOSE-CAPILLARY: 238 mg/dL — AB (ref 65–99)

## 2017-02-09 LAB — DIFFERENTIAL
BASOS ABS: 0 10*3/uL (ref 0.0–0.1)
BASOS PCT: 0 %
Eosinophils Absolute: 0 10*3/uL (ref 0.0–0.7)
Eosinophils Relative: 0 %
Lymphocytes Relative: 6 %
Lymphs Abs: 0.7 10*3/uL (ref 0.7–4.0)
MONO ABS: 1.2 10*3/uL — AB (ref 0.1–1.0)
Monocytes Relative: 9 %
NEUTROS ABS: 10.3 10*3/uL — AB (ref 1.7–7.7)
NEUTROS PCT: 85 %

## 2017-02-09 LAB — I-STAT CG4 LACTIC ACID, ED
LACTIC ACID, VENOUS: 2.09 mmol/L — AB (ref 0.5–1.9)
Lactic Acid, Venous: 2.49 mmol/L (ref 0.5–1.9)

## 2017-02-09 LAB — RAPID URINE DRUG SCREEN, HOSP PERFORMED
AMPHETAMINES: NOT DETECTED
BENZODIAZEPINES: NOT DETECTED
Barbiturates: NOT DETECTED
Cocaine: NOT DETECTED
OPIATES: NOT DETECTED
Tetrahydrocannabinol: NOT DETECTED

## 2017-02-09 LAB — PROTIME-INR
INR: 0.96
PROTHROMBIN TIME: 12.7 s (ref 11.4–15.2)

## 2017-02-09 LAB — I-STAT TROPONIN, ED: Troponin i, poc: 0.01 ng/mL (ref 0.00–0.08)

## 2017-02-09 LAB — APTT: APTT: 25 s (ref 24–36)

## 2017-02-09 MED ORDER — PIPERACILLIN-TAZOBACTAM 3.375 G IVPB 30 MIN
3.3750 g | Freq: Once | INTRAVENOUS | Status: AC
  Administered 2017-02-09: 3.375 g via INTRAVENOUS
  Filled 2017-02-09: qty 50

## 2017-02-09 MED ORDER — ACETAMINOPHEN 160 MG/5ML PO SOLN: 650.0000 mg | ORAL | Status: DC | PRN

## 2017-02-09 MED ORDER — ASPIRIN 325 MG PO TABS
325.0000 mg | ORAL_TABLET | Freq: Every day | ORAL | Status: DC
  Administered 2017-02-10: 325 mg via ORAL
  Filled 2017-02-09: qty 1

## 2017-02-09 MED ORDER — RIVASTIGMINE 13.3 MG/24HR TD PT24
13.3000 mg | MEDICATED_PATCH | Freq: Every day | TRANSDERMAL | Status: DC
  Administered 2017-02-10 – 2017-02-11 (×2): 13.3 mg via TRANSDERMAL
  Filled 2017-02-09 (×2): qty 1

## 2017-02-09 MED ORDER — ACETAMINOPHEN 325 MG PO TABS
650.0000 mg | ORAL_TABLET | Freq: Once | ORAL | Status: AC
  Administered 2017-02-09: 650 mg via ORAL
  Filled 2017-02-09: qty 2

## 2017-02-09 MED ORDER — STROKE: EARLY STAGES OF RECOVERY BOOK
Freq: Once | Status: DC
  Filled 2017-02-09: qty 1

## 2017-02-09 MED ORDER — VANCOMYCIN HCL IN DEXTROSE 750-5 MG/150ML-% IV SOLN
750.0000 mg | Freq: Two times a day (BID) | INTRAVENOUS | Status: DC
  Filled 2017-02-09 (×2): qty 150

## 2017-02-09 MED ORDER — INSULIN ASPART 100 UNIT/ML ~~LOC~~ SOLN
0.0000 [IU] | Freq: Three times a day (TID) | SUBCUTANEOUS | Status: DC
  Administered 2017-02-10: 2 [IU] via SUBCUTANEOUS
  Administered 2017-02-10: 3 [IU] via SUBCUTANEOUS
  Administered 2017-02-11: 1 [IU] via SUBCUTANEOUS

## 2017-02-09 MED ORDER — EZETIMIBE 10 MG PO TABS
10.0000 mg | ORAL_TABLET | Freq: Every day | ORAL | Status: DC
  Administered 2017-02-10 (×2): 10 mg via ORAL
  Filled 2017-02-09 (×2): qty 1

## 2017-02-09 MED ORDER — ASPIRIN 300 MG RE SUPP: 300.0000 mg | Freq: Every day | RECTAL | Status: DC

## 2017-02-09 MED ORDER — PIPERACILLIN-TAZOBACTAM 3.375 G IVPB
3.3750 g | Freq: Three times a day (TID) | INTRAVENOUS | Status: DC
  Administered 2017-02-10 (×2): 3.375 g via INTRAVENOUS
  Filled 2017-02-09 (×2): qty 50

## 2017-02-09 MED ORDER — PANTOPRAZOLE SODIUM 40 MG PO TBEC
40.0000 mg | DELAYED_RELEASE_TABLET | Freq: Every day | ORAL | Status: DC
  Administered 2017-02-10 – 2017-02-11 (×2): 40 mg via ORAL
  Filled 2017-02-09 (×2): qty 1

## 2017-02-09 MED ORDER — COLESEVELAM HCL 625 MG PO TABS
1875.0000 mg | ORAL_TABLET | Freq: Two times a day (BID) | ORAL | Status: DC
  Administered 2017-02-10 – 2017-02-11 (×3): 1875 mg via ORAL
  Filled 2017-02-09 (×4): qty 3

## 2017-02-09 MED ORDER — SODIUM CHLORIDE 0.9 % IV SOLN
INTRAVENOUS | Status: DC
  Administered 2017-02-10 – 2017-02-11 (×2): via INTRAVENOUS

## 2017-02-09 MED ORDER — CARBIDOPA-LEVODOPA 25-100 MG PO TABS
1.0000 | ORAL_TABLET | Freq: Three times a day (TID) | ORAL | Status: DC
  Administered 2017-02-10 – 2017-02-11 (×5): 1 via ORAL
  Filled 2017-02-09 (×5): qty 1

## 2017-02-09 MED ORDER — PIOGLITAZONE HCL 30 MG PO TABS
30.0000 mg | ORAL_TABLET | Freq: Every day | ORAL | Status: DC
  Administered 2017-02-10 – 2017-02-11 (×2): 30 mg via ORAL
  Filled 2017-02-09 (×2): qty 1

## 2017-02-09 MED ORDER — FLUOXETINE HCL 20 MG PO CAPS
60.0000 mg | ORAL_CAPSULE | Freq: Every day | ORAL | Status: DC
Start: 2017-02-10 — End: 2017-02-10
  Administered 2017-02-10: 60 mg via ORAL
  Filled 2017-02-09: qty 3

## 2017-02-09 MED ORDER — ACETAMINOPHEN 325 MG PO TABS: 650.0000 mg | ORAL_TABLET | ORAL | Status: DC | PRN

## 2017-02-09 MED ORDER — INSULIN GLARGINE 100 UNIT/ML ~~LOC~~ SOLN
15.0000 [IU] | Freq: Two times a day (BID) | SUBCUTANEOUS | Status: DC
  Administered 2017-02-10: 15 [IU] via SUBCUTANEOUS
  Filled 2017-02-09 (×2): qty 0.15

## 2017-02-09 MED ORDER — ENOXAPARIN SODIUM 40 MG/0.4ML ~~LOC~~ SOLN
40.0000 mg | SUBCUTANEOUS | Status: DC
  Administered 2017-02-10 – 2017-02-11 (×2): 40 mg via SUBCUTANEOUS
  Filled 2017-02-09 (×2): qty 0.4

## 2017-02-09 MED ORDER — VANCOMYCIN HCL IN DEXTROSE 1-5 GM/200ML-% IV SOLN: 1000.0000 mg | Freq: Once | INTRAVENOUS | Status: DC

## 2017-02-09 MED ORDER — TAMSULOSIN HCL 0.4 MG PO CAPS
0.4000 mg | ORAL_CAPSULE | Freq: Every day | ORAL | Status: DC
  Administered 2017-02-10 – 2017-02-11 (×2): 0.4 mg via ORAL
  Filled 2017-02-09 (×2): qty 1

## 2017-02-09 MED ORDER — ALPRAZOLAM 0.25 MG PO TABS: 0.2500 mg | ORAL_TABLET | Freq: Every day | ORAL | Status: DC

## 2017-02-09 MED ORDER — ACETAMINOPHEN 650 MG RE SUPP: 650.0000 mg | RECTAL | Status: DC | PRN

## 2017-02-09 MED ORDER — IOPAMIDOL (ISOVUE-370) INJECTION 76%
INTRAVENOUS | Status: AC
  Administered 2017-02-09: 100 mL
  Filled 2017-02-09: qty 100

## 2017-02-09 MED ORDER — HYDROXYZINE HCL 25 MG PO TABS: 50.0000 mg | ORAL_TABLET | Freq: Every evening | ORAL | Status: DC | PRN

## 2017-02-09 MED ORDER — SODIUM CHLORIDE 0.9 % IV BOLUS (SEPSIS)
1000.0000 mL | Freq: Once | INTRAVENOUS | Status: AC
  Administered 2017-02-09: 1000 mL via INTRAVENOUS

## 2017-02-09 MED ORDER — VANCOMYCIN HCL 10 G IV SOLR
2000.0000 mg | Freq: Once | INTRAVENOUS | Status: AC
  Administered 2017-02-09: 2000 mg via INTRAVENOUS
  Filled 2017-02-09: qty 2000

## 2017-02-09 NOTE — ED Provider Notes (Signed)
[facility] EMERGENCY DEPARTMENT Provider Note   CSN: [id number] Arrival date & time: 02/09/17  1740  LEVEL 5 CAVEAT -  ALTERED MENTAL STATUS   History   Chief Complaint Chief Complaint  Patient presents with  . Code Stroke    HPI Evan Vaughan is a 83 y.o. male.  HPI  83 year old male presents as a code stroke.  History is limited as the patient is initially a phasic and somewhat confused.  According to EMS and son, the patient was last seen normal around 1 PM.  The patient had vomiting today and his wife has been sick with a diarrheal illness recently.  PCP advised going to the ER.  EMS noticed a facial droop and right-sided weakness.  Neurology noticed right hemianopsia and facial droop and weakness on the right with drift.  The patient currently denies any complaints.  Past Medical History:  Diagnosis Date  . Bruises easily   . Diabetes mellitus without complication [facility])     Patient Active Problem List   Diagnosis Date Noted  . TIA (transient ischemic attack) 02/09/2017  . Fever 02/09/2017  . First degree AV block 08/18/2016  . Abnormal myocardial perfusion study 08/18/2016  . Lumbar radicular pain 08/04/2016  . Dyslipidemia 06/25/2016  . Essential hypertension 06/25/2016  . Near syncope 06/25/2016  . Type 2 diabetes mellitus without complication ([place]) 75/17/0017  . Cognitive impairment 08/16/2015  . Dementia without behavioral disturbance 08/16/2015  . Chronic pain syndrome 09/05/2014  . Degeneration of intervertebral disc of lumbar region 07/08/2012  . Degenerative disc disease, lumbar 07/08/2012  . Thoracic or lumbosacral neuritis or radiculitis 10/08/2010    Past Surgical History:  Procedure Laterality Date  . TONSILLECTOMY         Home Medications    Prior to Admission medications   Medication Sig Start Date End Date Taking? Authorizing Provider  ALPRAZolam (XANAX) 0.25 MG tablet Take 0.25 mg by mouth at bedtime. 08/14/16  Yes  [provider]  aspirin EC 81 MG tablet Take 81 mg by mouth daily.   Yes [provider]  carbidopa-levodopa (SINEMET IR) 25-100 MG tablet Take 1/2 tablet three times a day before meals Patient taking differently: Take 1 tablet by mouth 3 (three) times daily.  11/18/16  Yes Cameron Sprang, MD  colesevelam [facility]) 625 MG tablet Take 1,875 mg by mouth 2 (two) times daily with a meal. Take one (1) tablet (625 mg) each morning and two (2) tablets (1250 mg) each evening. 06/12/16  Yes [provider]  DUPIXENT 300 MG/2ML SOSY Take one (1) inject as directed every two (2) weeks. 08/11/16  Yes [provider]  ezetimibe (ZETIA) 10 MG tablet Take 10 mg by mouth at bedtime. 04/10/16  Yes [provider]  FLUoxetine (PROZAC) 20 MG capsule Take 60 mg by mouth at bedtime. (Take with 40 mg = 60 mg total) 07/21/16  Yes [provider]  FLUoxetine (PROZAC) 40 MG capsule Take 40 mg by mouth at bedtime. (Take with 20 mg = 60 mg total) 07/22/16  Yes [provider]  hydrOXYzine (ATARAX/VISTARIL) 50 MG tablet Take 50 mg by mouth at bedtime as needed for itching. 05/08/16  Yes [provider]  LANTUS SOLOSTAR 100 UNIT/ML Solostar Pen Inject 25 Units into the skin 2 (two) times daily. 06/12/16  Yes [provider]  omeprazole (PRILOSEC) 20 MG capsule Take 20 mg by mouth daily.  01/12/13  Yes [provider]  pioglitazone (ACTOS) 30  MG tablet Take 30 mg by mouth every morning.  05/25/16  Yes [provider]  rivastigmine (EXELON) 13.3 MG/24HR Place 13.3 mg onto the skin daily. 02/04/17  Yes [provider]  tamsulosin (FLOMAX) 0.4 MG CAPS capsule Take 0.4 mg by mouth daily. 01/22/17  Yes [provider]    Family History Family History  Problem Relation Age of Onset  . Heart disease Mother   . Hypertension Father   . Heart attack Father   . Healthy Son   . Diabetes Other        sibling  . Heart Problems  Other        sibling    Social History Social History   Tobacco Use  . Smoking status: Never Smoker  . Smokeless tobacco: Never Used  Substance Use Topics  . Alcohol use: No  . Drug use: No     Allergies   Patient has no known allergies.   Review of Systems Review of Systems  Unable to perform ROS: Mental status change     Physical Exam Updated Vital Signs BP (!) 109/99   Pulse 96   Temp (!) 102.2 F (39 C)   Resp 18   Ht 6\' 1"  (1.854 m)   Wt 83.9 kg (185 lb)   SpO2 97%   BMI 24.41 kg/m   Physical Exam  Constitutional: He appears well-developed and well-nourished. No distress.  HENT:  Head: Normocephalic and atraumatic.  Right Ear: External ear normal.  Left Ear: External ear normal.  Nose: Nose normal.  Mouth/Throat: Mucous membranes are dry.  Eyes: EOM are normal. Pupils are equal, round, and reactive to light. Right eye exhibits no discharge. Left eye exhibits no discharge.  Neck: Neck supple.  Cardiovascular: Normal rate, regular rhythm and normal heart sounds.  Pulmonary/Chest: Effort normal and breath sounds normal.  Abdominal: Soft. There is no tenderness.  Musculoskeletal: He exhibits no edema.  Neurological: He is alert.  Awake, alert, oriented to person and place, disoriented to time (thinks it's February, can't name year). CN 3-12 grossly intact. 5/5 strength in all 4 extremities. Grossly normal sensation. Normal finger to nose.   Skin: Skin is warm and dry. He is not diaphoretic.  Nursing note and vitals reviewed.    ED Treatments / Results  Labs (all labs ordered are listed, but only abnormal results are displayed) Labs Reviewed  CBC - Abnormal; Notable for the following components:      Result Value   WBC 12.2 (*)    All other components within normal limits  DIFFERENTIAL - Abnormal; Notable for the following components:   Neutro Abs 10.3 (*)    Monocytes Absolute 1.2 (*)    All other components within normal limits  COMPREHENSIVE  METABOLIC PANEL - Abnormal; Notable for the following components:   Sodium 132 (*)    Chloride 98 (*)    Glucose, Bld 248 (*)    BUN 23 (*)    Creatinine, Ser 1.49 (*)    Calcium 8.5 (*)    Total Protein 6.3 (*)    GFR calc non Af Amer 43 (*)    GFR calc Af Amer 49 (*)    All other components within normal limits  URINALYSIS, ROUTINE W REFLEX MICROSCOPIC - Abnormal; Notable for the following components:   Color, Urine STRAW (*)    Specific Gravity, Urine >1.046 (*)    Glucose, UA >=500 (*)    All other components within normal limits  I-STAT CHEM  8, ED - Abnormal; Notable for the following components:   Chloride 97 (*)    BUN 26 (*)    Creatinine, Ser 1.30 (*)    Glucose, Bld 244 (*)    Calcium, Ion 1.08 (*)    All other components within normal limits  CBG MONITORING, ED - Abnormal; Notable for the following components:   Glucose-Capillary 238 (*)    All other components within normal limits  I-STAT CG4 LACTIC ACID, ED - Abnormal; Notable for the following components:   Lactic Acid, Venous 2.49 (*)    All other components within normal limits  I-STAT CG4 LACTIC ACID, ED - Abnormal; Notable for the following components:   Lactic Acid, Venous 2.09 (*)    All other components within normal limits  CULTURE, BLOOD (ROUTINE X 2)  CULTURE, BLOOD (ROUTINE X 2)  URINE CULTURE  ETHANOL  PROTIME-INR  APTT  RAPID URINE DRUG SCREEN, HOSP PERFORMED  INFLUENZA PANEL BY PCR (TYPE A & B)  HEMOGLOBIN A1C  LIPID PANEL  CBC  CREATININE, SERUM  COMPREHENSIVE METABOLIC PANEL  CBC  I-STAT TROPONIN, ED    EKG  EKG Interpretation  Date/Time:  Tuesday February 09 2017 18:47:03 EST Ventricular Rate:  87 PR Interval:    QRS Duration: 91 QT Interval:  359 QTC Calculation: 432 R Axis:   -46 Text Interpretation:  Sinus or ectopic atrial rhythm Prolonged PR interval LAD, consider left anterior fascicular block RSR' in V1 or V2, probably normal variant Abnormal T, consider ischemia, lateral  leads No old tracing to compare Confirmed by Sherwood Gambler [phone]) on 02/09/2017 7:29:19 PM       Radiology Ct Angio Head W Or Wo Contrast  Result Date: 02/09/2017 CLINICAL DATA:  Initial evaluation for acute right-sided weakness. Code stroke. EXAM: CT ANGIOGRAPHY HEAD AND NECK CT PERFUSION BRAIN TECHNIQUE: Multidetector CT imaging of the head and neck was performed using the standard protocol during bolus administration of intravenous contrast. Multiplanar CT image reconstructions and MIPs were obtained to evaluate the vascular anatomy. Carotid stenosis measurements (when applicable) are obtained utilizing NASCET criteria, using the distal internal carotid diameter as the denominator. Multiphase CT imaging of the brain was performed following IV bolus contrast injection. Subsequent parametric perfusion maps were calculated using RAPID software. CONTRAST:  118mL ISOVUE-370 IOPAMIDOL (ISOVUE-370) INJECTION 76% COMPARISON:  Prior noncontrast CT from earlier the same day. FINDINGS: CTA NECK FINDINGS Aortic arch: Visualized aortic arch of normal caliber with normal branch pattern. No flow-limiting stenosis about the origin of the great vessels. Visualized subclavian arteries widely patent. Right carotid system: Right common carotid artery patent from its origin to the bifurcation. Concentric soft plaque about the proximal right ICA with severe short-segment stenosis of approximately 75% by NASCET criteria (series 6, image 205). Right ICA mildly irregular but widely patent distally to the skull base. Left carotid system: Left common carotid artery patent from its origin to the bifurcation without stenosis. Atheromatous irregularity about the left bifurcation/proximal left ICA without hemodynamically significant stenosis. Left ICA patent distally to the skull base. Vertebral arteries: Both of the vertebral arteries arise from the subclavian arteries. Left vertebral artery occluded at its origin and remains occluded  within the neck. Right vertebral artery patent within the neck without flow-limiting stenosis, dissection, or occlusion. Skeleton: No acute osseous abnormality. No worrisome lytic or blastic osseous lesions. Moderate degenerative spondylolysis at C3-4 through C7-T1. Other neck: No acute soft tissue abnormality within the neck. Salivary glands normal. Thyroid normal. Upper chest: Upper chest  within normal limits. Visualized lungs are clear. Review of the MIP images confirms the above findings CTA HEAD FINDINGS Anterior circulation: Petrous segments widely patent bilaterally. Mild for age atheromatous plaque within the carotid siphons without flow-limiting stenosis. ICA termini widely patent. Dominant and patent right A1 segment. Left A1 hypoplastic and patent as well. Normal anterior communicating artery. Anterior cerebral arteries patent to their distal aspects without stenosis. M1 segments patent without stenosis or occlusion. Normal MCA bifurcations. No proximal M2 occlusion. Distal MCA branches well perfused and symmetric. Posterior circulation: Focal plaque within the proximal right V4 segment with mild short-segment stenosis. Right vertebral artery otherwise patent to the vertebrobasilar junction without stenosis. Some retrograde filling of the left V4 segment which is markedly attenuated and irregular. Posterior inferior cerebral arteries not visualized. Basilar artery patent to its distal aspect without stenosis. Superior cerebral arteries patent bilaterally. Right PCA supplied via the basilar. Hypoplastic left P1 with a prominent left posterior communicating artery. PCAs widely patent to their distal aspects without stenosis. Venous sinuses: Patent. Anatomic variants: None significant. Delayed phase: Not performed. Review of the MIP images confirms the above findings CT Brain Perfusion Findings: CBF (<30%) Volume: 24mL Perfusion (Tmax>6.0s) volume: 97mL Mismatch Volume: 20mL Infarction Location:Negative  IMPRESSION: 1. Negative CTA for emergent large vessel occlusion. No acute infarct on CT perfusion. 2. Short-segment severe proximal right ICA stenosis measuring 75% by NASCET criteria. 3. Occlusion of the left vertebral artery at its origin. Single short-segment mild right V4 stenosis. Otherwise patent vertebrobasilar system. These results were communicated to [facility] [place] 1/8/2019by text page via the [facility] messaging system. Electronically Signed   By: Jeannine Boga M.D.   On: 02/09/2017 18:58   Ct Angio Neck W Or Wo Contrast  Result Date: 02/09/2017 CLINICAL DATA:  Initial evaluation for acute right-sided weakness. Code stroke. EXAM: CT ANGIOGRAPHY HEAD AND NECK CT PERFUSION BRAIN TECHNIQUE: Multidetector CT imaging of the head and neck was performed using the standard protocol during bolus administration of intravenous contrast. Multiplanar CT image reconstructions and MIPs were obtained to evaluate the vascular anatomy. Carotid stenosis measurements (when applicable) are obtained utilizing NASCET criteria, using the distal internal carotid diameter as the denominator. Multiphase CT imaging of the brain was performed following IV bolus contrast injection. Subsequent parametric perfusion maps were calculated using RAPID software. CONTRAST:  133mL ISOVUE-370 IOPAMIDOL (ISOVUE-370) INJECTION 76% COMPARISON:  Prior noncontrast CT from earlier the same day. FINDINGS: CTA NECK FINDINGS Aortic arch: Visualized aortic arch of normal caliber with normal branch pattern. No flow-limiting stenosis about the origin of the great vessels. Visualized subclavian arteries widely patent. Right carotid system: Right common carotid artery patent from its origin to the bifurcation. Concentric soft plaque about the proximal right ICA with severe short-segment stenosis of approximately 75% by NASCET criteria (series 6, image 205). Right ICA mildly irregular but widely patent distally to the skull base. Left carotid  system: Left common carotid artery patent from its origin to the bifurcation without stenosis. Atheromatous irregularity about the left bifurcation/proximal left ICA without hemodynamically significant stenosis. Left ICA patent distally to the skull base. Vertebral arteries: Both of the vertebral arteries arise from the subclavian arteries. Left vertebral artery occluded at its origin and remains occluded within the neck. Right vertebral artery patent within the neck without flow-limiting stenosis, dissection, or occlusion. Skeleton: No acute osseous abnormality. No worrisome lytic or blastic osseous lesions. Moderate degenerative spondylolysis at C3-4 through C7-T1. Other neck: No acute soft tissue abnormality within the neck. Salivary  glands normal. Thyroid normal. Upper chest: Upper chest within normal limits. Visualized lungs are clear. Review of the MIP images confirms the above findings CTA HEAD FINDINGS Anterior circulation: Petrous segments widely patent bilaterally. Mild for age atheromatous plaque within the carotid siphons without flow-limiting stenosis. ICA termini widely patent. Dominant and patent right A1 segment. Left A1 hypoplastic and patent as well. Normal anterior communicating artery. Anterior cerebral arteries patent to their distal aspects without stenosis. M1 segments patent without stenosis or occlusion. Normal MCA bifurcations. No proximal M2 occlusion. Distal MCA branches well perfused and symmetric. Posterior circulation: Focal plaque within the proximal right V4 segment with mild short-segment stenosis. Right vertebral artery otherwise patent to the vertebrobasilar junction without stenosis. Some retrograde filling of the left V4 segment which is markedly attenuated and irregular. Posterior inferior cerebral arteries not visualized. Basilar artery patent to its distal aspect without stenosis. Superior cerebral arteries patent bilaterally. Right PCA supplied via the basilar. Hypoplastic  left P1 with a prominent left posterior communicating artery. PCAs widely patent to their distal aspects without stenosis. Venous sinuses: Patent. Anatomic variants: None significant. Delayed phase: Not performed. Review of the MIP images confirms the above findings CT Brain Perfusion Findings: CBF (<30%) Volume: 65mL Perfusion (Tmax>6.0s) volume: 41mL Mismatch Volume: 72mL Infarction Location:Negative IMPRESSION: 1. Negative CTA for emergent large vessel occlusion. No acute infarct on CT perfusion. 2. Short-segment severe proximal right ICA stenosis measuring 75% by NASCET criteria. 3. Occlusion of the left vertebral artery at its origin. Single short-segment mild right V4 stenosis. Otherwise patent vertebrobasilar system. These results were communicated to [facility] [place] 1/8/2019by text page via the [facility] messaging system. Electronically Signed   By: Jeannine Boga M.D.   On: 02/09/2017 18:58   Ct Cerebral Perfusion W Contrast  Result Date: 02/09/2017 CLINICAL DATA:  Initial evaluation for acute right-sided weakness. Code stroke. EXAM: CT ANGIOGRAPHY HEAD AND NECK CT PERFUSION BRAIN TECHNIQUE: Multidetector CT imaging of the head and neck was performed using the standard protocol during bolus administration of intravenous contrast. Multiplanar CT image reconstructions and MIPs were obtained to evaluate the vascular anatomy. Carotid stenosis measurements (when applicable) are obtained utilizing NASCET criteria, using the distal internal carotid diameter as the denominator. Multiphase CT imaging of the brain was performed following IV bolus contrast injection. Subsequent parametric perfusion maps were calculated using RAPID software. CONTRAST:  137mL ISOVUE-370 IOPAMIDOL (ISOVUE-370) INJECTION 76% COMPARISON:  Prior noncontrast CT from earlier the same day. FINDINGS: CTA NECK FINDINGS Aortic arch: Visualized aortic arch of normal caliber with normal branch pattern. No flow-limiting stenosis about the  origin of the great vessels. Visualized subclavian arteries widely patent. Right carotid system: Right common carotid artery patent from its origin to the bifurcation. Concentric soft plaque about the proximal right ICA with severe short-segment stenosis of approximately 75% by NASCET criteria (series 6, image 205). Right ICA mildly irregular but widely patent distally to the skull base. Left carotid system: Left common carotid artery patent from its origin to the bifurcation without stenosis. Atheromatous irregularity about the left bifurcation/proximal left ICA without hemodynamically significant stenosis. Left ICA patent distally to the skull base. Vertebral arteries: Both of the vertebral arteries arise from the subclavian arteries. Left vertebral artery occluded at its origin and remains occluded within the neck. Right vertebral artery patent within the neck without flow-limiting stenosis, dissection, or occlusion. Skeleton: No acute osseous abnormality. No worrisome lytic or blastic osseous lesions. Moderate degenerative spondylolysis at C3-4 through C7-T1. Other neck: No acute soft  tissue abnormality within the neck. Salivary glands normal. Thyroid normal. Upper chest: Upper chest within normal limits. Visualized lungs are clear. Review of the MIP images confirms the above findings CTA HEAD FINDINGS Anterior circulation: Petrous segments widely patent bilaterally. Mild for age atheromatous plaque within the carotid siphons without flow-limiting stenosis. ICA termini widely patent. Dominant and patent right A1 segment. Left A1 hypoplastic and patent as well. Normal anterior communicating artery. Anterior cerebral arteries patent to their distal aspects without stenosis. M1 segments patent without stenosis or occlusion. Normal MCA bifurcations. No proximal M2 occlusion. Distal MCA branches well perfused and symmetric. Posterior circulation: Focal plaque within the proximal right V4 segment with mild  short-segment stenosis. Right vertebral artery otherwise patent to the vertebrobasilar junction without stenosis. Some retrograde filling of the left V4 segment which is markedly attenuated and irregular. Posterior inferior cerebral arteries not visualized. Basilar artery patent to its distal aspect without stenosis. Superior cerebral arteries patent bilaterally. Right PCA supplied via the basilar. Hypoplastic left P1 with a prominent left posterior communicating artery. PCAs widely patent to their distal aspects without stenosis. Venous sinuses: Patent. Anatomic variants: None significant. Delayed phase: Not performed. Review of the MIP images confirms the above findings CT Brain Perfusion Findings: CBF (<30%) Volume: 92mL Perfusion (Tmax>6.0s) volume: 23mL Mismatch Volume: 50mL Infarction Location:Negative IMPRESSION: 1. Negative CTA for emergent large vessel occlusion. No acute infarct on CT perfusion. 2. Short-segment severe proximal right ICA stenosis measuring 75% by NASCET criteria. 3. Occlusion of the left vertebral artery at its origin. Single short-segment mild right V4 stenosis. Otherwise patent vertebrobasilar system. These results were communicated to [facility] [place] 1/8/2019by text page via the [facility] messaging system. Electronically Signed   By: Jeannine Boga M.D.   On: 02/09/2017 18:58   Dg Chest Port 1 View  Result Date: 02/09/2017 CLINICAL DATA:  Fever and altered mental status.  Code sepsis. EXAM: PORTABLE CHEST 1 VIEW COMPARISON:  11/14/2016 FINDINGS: The heart size and mediastinal contours are within normal limits. No pneumonic consolidations or CH F. There is mild central vascular congestion. No effusion or pneumothorax. The visualized skeletal structures are unremarkable. IMPRESSION: Mild central vascular congestion without active pulmonary disease. Electronically Signed   By: Ashley Royalty M.D.   On: 02/09/2017 19:52   Ct Head Code Stroke Wo Contrast  Result Date:  02/09/2017 CLINICAL DATA:  Code stroke.  Code stroke with right-sided weakness. EXAM: CT HEAD WITHOUT CONTRAST TECHNIQUE: Contiguous axial images were obtained from the base of the skull through the vertex without intravenous contrast. COMPARISON:  MRI 11/14/2014 FINDINGS: Brain: Some motion degradation. The brain shows generalized atrophy. There is an old right occipital cortical and subcortical infarction. There is chronic ventriculomegaly secondary to central atrophy. No sign of acute infarction, mass lesion, hemorrhage or extra-axial collection. Vascular: There is atherosclerotic calcification of the major vessels at the base of the brain. Skull: Negative Sinuses/Orbits: Clear/normal Other: None ASPECTS ([place] Stroke Program Early CT Score) - Ganglionic level infarction (caudate, lentiform nuclei, internal capsule, insula, M1-M3 cortex): 7 - Supraganglionic infarction (M4-M6 cortex): 3 Total score (0-10 with 10 being normal): 10 IMPRESSION: 1. No acute finding. Central atrophy with chronic ventriculomegaly. 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 2. ASPECTS is 10 3. These results were communicated to Dr. Leonel Ramsay at 5:58 pmon 1/8/2019by text page via the [facility] messaging system. Electronically Signed   By: Nelson Chimes M.D.   On: 02/09/2017 18:00    Procedures Procedures (including critical care time)  Medications  Ordered in ED Medications  vancomycin (VANCOCIN) IVPB 750 mg/150 ml premix (not administered)  piperacillin-tazobactam (ZOSYN) IVPB 3.375 g (not administered)  ALPRAZolam (XANAX) tablet 0.25 mg (not administered)  carbidopa-levodopa (SINEMET IR) 25-100 MG per tablet immediate release 1 tablet (not administered)  colesevelam [facility]) tablet 1,875 mg (not administered)  ezetimibe (ZETIA) tablet 10 mg (not administered)  FLUoxetine (PROZAC) capsule 60 mg (not administered)  hydrOXYzine (ATARAX/VISTARIL) tablet 50 mg (not administered)  insulin glargine (LANTUS)  injection 15 Units (not administered)  pantoprazole (PROTONIX) EC tablet 40 mg (not administered)  pioglitazone (ACTOS) tablet 30 mg (not administered)  rivastigmine (EXELON) 13.3 MG/24HR 13.3 mg (not administered)  tamsulosin (FLOMAX) capsule 0.4 mg (not administered)   stroke: mapping our early stages of recovery book (not administered)  0.9 %  sodium chloride infusion (not administered)  acetaminophen (TYLENOL) tablet 650 mg (not administered)    Or  acetaminophen (TYLENOL) solution 650 mg (not administered)    Or  acetaminophen (TYLENOL) suppository 650 mg (not administered)  enoxaparin (LOVENOX) injection 40 mg (not administered)  aspirin suppository 300 mg (not administered)    Or  aspirin tablet 325 mg (not administered)  insulin aspart (novoLOG) injection 0-9 Units (not administered)  LORazepam (ATIVAN) injection 0.25 mg (not administered)  iopamidol (ISOVUE-370) 76 % injection (100 mLs  Contrast Given 02/09/17 1800)  piperacillin-tazobactam (ZOSYN) IVPB 3.375 g (0 g Intravenous Stopped 02/09/17 2042)  vancomycin (VANCOCIN) 2,000 mg in sodium chloride 0.9 % 500 mL IVPB (0 mg Intravenous Stopped 02/09/17 2139)  acetaminophen (TYLENOL) tablet 650 mg (650 mg Oral Given 02/09/17 1937)  sodium chloride 0.9 % bolus 1,000 mL (1,000 mLs Intravenous New Bag/Given 02/09/17 2311)     Initial Impression / Assessment and Plan / ED Course  I have reviewed the triage vital signs and the nursing notes.  Pertinent labs & imaging results that were available during my care of the patient were reviewed by me and considered in my medical decision making (see chart for details).  Clinical Course as of Feb 11 23  Tue Feb 09, 2017  1826 Patient found to have a fever of 102 in the emergency department.  He will be worked up for sepsis.  No indications of large vessel occlusion on workup for stroke.  His symptoms have essentially resolved.  [SG]    Clinical Course User Index [SG] Sherwood Gambler, MD     Patient presents as an acute code stroke.  Symptoms have resolved after coming out of the CT scanner.  Thus the seems consistent with a TIA.  He was also found to have a fever and so he was worked up for sepsis.  Given his change in mental status with fever, code sepsis called.  He was given broad antibiotics given no clear source.  However now he is acting normally and so I think meningitis is much less likely, especially with no meningismus.  Neuro recommends admission for TIA workup.  He has had vomiting and now family tells me he is also had some cough and congestion.  Could be a viral syndrome versus influenza.  He will still need blood cultures evaluated.  No clear bacterial source such as UTI or pneumonia.  Admit to the hospitalist.  Abdominal exam benign.  Final Clinical Impressions(s) / ED Diagnoses   Final diagnoses:  TIA (transient ischemic attack)  Fever in adult    ED Discharge Orders    None       Sherwood Gambler, MD 02/10/17 [phone]

## 2017-02-09 NOTE — ED Notes (Signed)
Urine specimen sent to lab once more.

## 2017-02-09 NOTE — ED Notes (Signed)
CBG 238; RN aware

## 2017-02-09 NOTE — H&P (Addendum)
History and Physical    Evan Vaughan TKP:[id number] DOB: 1934/11/12 DOA: 02/09/2017  PCP: Ernestene Kiel, MD  Patient coming from: Home.  Chief Complaint: Confusion and right-sided weakness.  HPI: Evan Vaughan is a 83 y.o. male with history of diabetes mellitus type 2, dementia, depression, gait instability, hyperlipidemia was brought to the ER after patient was found to have confusion and some right-sided weakness.  As per the patient's wife patient had been having multiple episodes of nausea vomiting since waking up yesterday morning.  Following which patient was given Phenergan and patient's vomiting improved.  At around 1 PM.  Patient workup patient was found to be confused and noticed to have right facial droop and EMS was called.  He was on exam feel that patient had right upper and lower cavity weakness.  ED Course: My time patient reached ER most of his symptoms have resolved.  CT head followed by CT angiogram of the head and neck was unremarkable.  Neurologist on-call was consulted patient admitted for further workup of TIA.  In the ER patient started developing fever of 102 F.  Chest x-ray UA are unremarkable.  Patient was empirically started on antibiotics for possible developing sepsis.  Flu panel is pending.  On exam patient abdomen appears benign and has had no further episodes of vomiting.  Review of Systems: As per HPI, rest all negative.   Past Medical History:  Diagnosis Date  . Bruises easily   . Diabetes mellitus without complication [facility])     Past Surgical History:  Procedure Laterality Date  . TONSILLECTOMY       reports that  has never smoked. he has never used smokeless tobacco. He reports that he does not drink alcohol or use drugs.  No Known Allergies  Family History  Problem Relation Age of Onset  . Heart disease Mother   . Hypertension Father   . Heart attack Father   . Healthy Son   . Diabetes Other        sibling  . Heart Problems  Other        sibling    Prior to Admission medications   Medication Sig Start Date End Date Taking? Authorizing Provider  ALPRAZolam (XANAX) 0.25 MG tablet Take 0.25 mg by mouth at bedtime. 08/14/16  Yes [provider]  aspirin EC 81 MG tablet Take 81 mg by mouth daily.   Yes [provider]  carbidopa-levodopa (SINEMET IR) 25-100 MG tablet Take 1/2 tablet three times a day before meals Patient taking differently: Take 1 tablet by mouth 3 (three) times daily.  11/18/16  Yes Cameron Sprang, MD  colesevelam [facility]) 625 MG tablet Take 1,875 mg by mouth 2 (two) times daily with a meal. Take one (1) tablet (625 mg) each morning and two (2) tablets (1250 mg) each evening. 06/12/16  Yes [provider]  DUPIXENT 300 MG/2ML SOSY Take one (1) inject as directed every two (2) weeks. 08/11/16  Yes [provider]  ezetimibe (ZETIA) 10 MG tablet Take 10 mg by mouth at bedtime. 04/10/16  Yes [provider]  FLUoxetine (PROZAC) 20 MG capsule Take 60 mg by mouth at bedtime. (Take with 40 mg = 60 mg total) 07/21/16  Yes [provider]  FLUoxetine (PROZAC) 40 MG capsule Take 40 mg by mouth at bedtime. (Take with 20 mg = 60 mg total) 07/22/16  Yes [provider]  hydrOXYzine (ATARAX/VISTARIL) 50 MG tablet Take 50 mg by mouth at  bedtime as needed for itching. 05/08/16  Yes [provider]  LANTUS SOLOSTAR 100 UNIT/ML Solostar Pen Inject 25 Units into the skin 2 (two) times daily. 06/12/16  Yes [provider]  omeprazole (PRILOSEC) 20 MG capsule Take 20 mg by mouth daily.  01/12/13  Yes [provider]  pioglitazone (ACTOS) 30 MG tablet Take 30 mg by mouth every morning.  05/25/16  Yes [provider]  rivastigmine (EXELON) 13.3 MG/24HR Place 13.3 mg onto the skin daily. 02/04/17  Yes [provider]  tamsulosin (FLOMAX) 0.4 MG CAPS capsule Take 0.4 mg by mouth daily. 01/22/17  Yes [provider]     Physical Exam: Vitals:   02/09/17 1943 02/09/17 2000 02/09/17 2015 02/09/17 2030  BP:  122/72 112/74 (!) 109/99  Pulse:  83 83 96  Resp:  17 16 18   Temp:      TempSrc:      SpO2:  100% 94% 97%  Weight: 83.9 kg (185 lb)     Height: 6\' 1"  (1.854 m)         Constitutional: Moderately built and nourished. Vitals:   02/09/17 1943 02/09/17 2000 02/09/17 2015 02/09/17 2030  BP:  122/72 112/74 (!) 109/99  Pulse:  83 83 96  Resp:  17 16 18   Temp:      TempSrc:      SpO2:  100% 94% 97%  Weight: 83.9 kg (185 lb)     Height: 6\' 1"  (1.854 m)      Eyes: Anicteric no pallor. ENMT: No discharge from the ears eyes nose or mouth. Neck: No mass felt.  No neck rigidity. Respiratory: No rhonchi or crepitations. Cardiovascular: S1-S2 heard no murmurs appreciated. Abdomen: Soft nontender bowel sounds present. Musculoskeletal: No edema.  No joint effusion. Skin: No rash.  Skin appears warm. Neurologic: Alert awake oriented to his name and place.  Moves all extremities 5 x 5.  No facial asymmetry tongue is midline pupils are equal and reacting to light. Psychiatric: Has mild dementia.   Labs on Admission: I have personally reviewed following labs and imaging studies  CBC: Recent Labs  Lab 02/09/17 1745 02/09/17 1748  WBC 12.2*  --   NEUTROABS 10.3*  --   HGB 13.2 15.0  HCT 41.7 44.0  MCV 88.7  --   PLT 198  --    Basic Metabolic Panel: Recent Labs  Lab 02/09/17 1745 02/09/17 1748  NA 132* 136  K 4.4 4.4  CL 98* 97*  CO2 23  --   GLUCOSE 248* 244*  BUN 23* 26*  CREATININE 1.49* 1.30*  CALCIUM 8.5*  --    GFR: Estimated Creatinine Clearance: 51.2 mL/min (A) (by C-G formula based on SCr of 1.3 mg/dL (H)). Liver Function Tests: Recent Labs  Lab 02/09/17 1745  AST 30  ALT 27  ALKPHOS 72  BILITOT 1.0  PROT 6.3*  ALBUMIN 3.5   No results for input(s): LIPASE, AMYLASE in the last 168 hours. No results for input(s): AMMONIA in the last 168 hours. Coagulation  Profile: Recent Labs  Lab 02/09/17 1745  INR 0.96   Cardiac Enzymes: No results for input(s): CKTOTAL, CKMB, CKMBINDEX, TROPONINI in the last 168 hours. BNP (last 3 results) No results for input(s): PROBNP in the last 8760 hours. HbA1C: No results for input(s): HGBA1C in the last 72 hours. CBG: Recent Labs  Lab 02/09/17 1744  GLUCAP 238*   Lipid Profile: No results for input(s): CHOL, HDL, LDLCALC, TRIG, CHOLHDL, LDLDIRECT in  the last 72 hours. Thyroid Function Tests: No results for input(s): TSH, T4TOTAL, FREET4, T3FREE, THYROIDAB in the last 72 hours. Anemia Panel: No results for input(s): VITAMINB12, FOLATE, FERRITIN, TIBC, IRON, RETICCTPCT in the last 72 hours. Urine analysis:    Component Value Date/Time   COLORURINE STRAW (A) 02/09/2017 1850   APPEARANCEUR CLEAR 02/09/2017 1850   LABSPEC >1.046 (H) 02/09/2017 1850   PHURINE 6.0 02/09/2017 1850   GLUCOSEU >=500 (A) 02/09/2017 1850   HGBUR NEGATIVE 02/09/2017 1850   BILIRUBINUR NEGATIVE 02/09/2017 1850   KETONESUR NEGATIVE 02/09/2017 1850   PROTEINUR NEGATIVE 02/09/2017 1850   NITRITE NEGATIVE 02/09/2017 1850   LEUKOCYTESUR NEGATIVE 02/09/2017 1850   Sepsis Labs: @LABRCNTIP (procalcitonin:4,lacticidven:4) )No results found for this or any previous visit (from the past 240 hour(s)).   Radiological Exams on Admission: Ct Angio Head W Or Wo Contrast  Result Date: 02/09/2017 CLINICAL DATA:  Initial evaluation for acute right-sided weakness. Code stroke. EXAM: CT ANGIOGRAPHY HEAD AND NECK CT PERFUSION BRAIN TECHNIQUE: Multidetector CT imaging of the head and neck was performed using the standard protocol during bolus administration of intravenous contrast. Multiplanar CT image reconstructions and MIPs were obtained to evaluate the vascular anatomy. Carotid stenosis measurements (when applicable) are obtained utilizing NASCET criteria, using the distal internal carotid diameter as the denominator. Multiphase CT imaging of  the brain was performed following IV bolus contrast injection. Subsequent parametric perfusion maps were calculated using RAPID software. CONTRAST:  162mL ISOVUE-370 IOPAMIDOL (ISOVUE-370) INJECTION 76% COMPARISON:  Prior noncontrast CT from earlier the same day. FINDINGS: CTA NECK FINDINGS Aortic arch: Visualized aortic arch of normal caliber with normal branch pattern. No flow-limiting stenosis about the origin of the great vessels. Visualized subclavian arteries widely patent. Right carotid system: Right common carotid artery patent from its origin to the bifurcation. Concentric soft plaque about the proximal right ICA with severe short-segment stenosis of approximately 75% by NASCET criteria (series 6, image 205). Right ICA mildly irregular but widely patent distally to the skull base. Left carotid system: Left common carotid artery patent from its origin to the bifurcation without stenosis. Atheromatous irregularity about the left bifurcation/proximal left ICA without hemodynamically significant stenosis. Left ICA patent distally to the skull base. Vertebral arteries: Both of the vertebral arteries arise from the subclavian arteries. Left vertebral artery occluded at its origin and remains occluded within the neck. Right vertebral artery patent within the neck without flow-limiting stenosis, dissection, or occlusion. Skeleton: No acute osseous abnormality. No worrisome lytic or blastic osseous lesions. Moderate degenerative spondylolysis at C3-4 through C7-T1. Other neck: No acute soft tissue abnormality within the neck. Salivary glands normal. Thyroid normal. Upper chest: Upper chest within normal limits. Visualized lungs are clear. Review of the MIP images confirms the above findings CTA HEAD FINDINGS Anterior circulation: Petrous segments widely patent bilaterally. Mild for age atheromatous plaque within the carotid siphons without flow-limiting stenosis. ICA termini widely patent. Dominant and patent right  A1 segment. Left A1 hypoplastic and patent as well. Normal anterior communicating artery. Anterior cerebral arteries patent to their distal aspects without stenosis. M1 segments patent without stenosis or occlusion. Normal MCA bifurcations. No proximal M2 occlusion. Distal MCA branches well perfused and symmetric. Posterior circulation: Focal plaque within the proximal right V4 segment with mild short-segment stenosis. Right vertebral artery otherwise patent to the vertebrobasilar junction without stenosis. Some retrograde filling of the left V4 segment which is markedly attenuated and irregular. Posterior inferior cerebral arteries not visualized. Basilar artery patent to its distal aspect without  stenosis. Superior cerebral arteries patent bilaterally. Right PCA supplied via the basilar. Hypoplastic left P1 with a prominent left posterior communicating artery. PCAs widely patent to their distal aspects without stenosis. Venous sinuses: Patent. Anatomic variants: None significant. Delayed phase: Not performed. Review of the MIP images confirms the above findings CT Brain Perfusion Findings: CBF (<30%) Volume: 60mL Perfusion (Tmax>6.0s) volume: 74mL Mismatch Volume: 47mL Infarction Location:Negative IMPRESSION: 1. Negative CTA for emergent large vessel occlusion. No acute infarct on CT perfusion. 2. Short-segment severe proximal right ICA stenosis measuring 75% by NASCET criteria. 3. Occlusion of the left vertebral artery at its origin. Single short-segment mild right V4 stenosis. Otherwise patent vertebrobasilar system. These results were communicated to [facility] [place] 1/8/2019by text page via the [facility] messaging system. Electronically Signed   By: Jeannine Boga M.D.   On: 02/09/2017 18:58   Ct Angio Neck W Or Wo Contrast  Result Date: 02/09/2017 CLINICAL DATA:  Initial evaluation for acute right-sided weakness. Code stroke. EXAM: CT ANGIOGRAPHY HEAD AND NECK CT PERFUSION BRAIN TECHNIQUE:  Multidetector CT imaging of the head and neck was performed using the standard protocol during bolus administration of intravenous contrast. Multiplanar CT image reconstructions and MIPs were obtained to evaluate the vascular anatomy. Carotid stenosis measurements (when applicable) are obtained utilizing NASCET criteria, using the distal internal carotid diameter as the denominator. Multiphase CT imaging of the brain was performed following IV bolus contrast injection. Subsequent parametric perfusion maps were calculated using RAPID software. CONTRAST:  131mL ISOVUE-370 IOPAMIDOL (ISOVUE-370) INJECTION 76% COMPARISON:  Prior noncontrast CT from earlier the same day. FINDINGS: CTA NECK FINDINGS Aortic arch: Visualized aortic arch of normal caliber with normal branch pattern. No flow-limiting stenosis about the origin of the great vessels. Visualized subclavian arteries widely patent. Right carotid system: Right common carotid artery patent from its origin to the bifurcation. Concentric soft plaque about the proximal right ICA with severe short-segment stenosis of approximately 75% by NASCET criteria (series 6, image 205). Right ICA mildly irregular but widely patent distally to the skull base. Left carotid system: Left common carotid artery patent from its origin to the bifurcation without stenosis. Atheromatous irregularity about the left bifurcation/proximal left ICA without hemodynamically significant stenosis. Left ICA patent distally to the skull base. Vertebral arteries: Both of the vertebral arteries arise from the subclavian arteries. Left vertebral artery occluded at its origin and remains occluded within the neck. Right vertebral artery patent within the neck without flow-limiting stenosis, dissection, or occlusion. Skeleton: No acute osseous abnormality. No worrisome lytic or blastic osseous lesions. Moderate degenerative spondylolysis at C3-4 through C7-T1. Other neck: No acute soft tissue abnormality  within the neck. Salivary glands normal. Thyroid normal. Upper chest: Upper chest within normal limits. Visualized lungs are clear. Review of the MIP images confirms the above findings CTA HEAD FINDINGS Anterior circulation: Petrous segments widely patent bilaterally. Mild for age atheromatous plaque within the carotid siphons without flow-limiting stenosis. ICA termini widely patent. Dominant and patent right A1 segment. Left A1 hypoplastic and patent as well. Normal anterior communicating artery. Anterior cerebral arteries patent to their distal aspects without stenosis. M1 segments patent without stenosis or occlusion. Normal MCA bifurcations. No proximal M2 occlusion. Distal MCA branches well perfused and symmetric. Posterior circulation: Focal plaque within the proximal right V4 segment with mild short-segment stenosis. Right vertebral artery otherwise patent to the vertebrobasilar junction without stenosis. Some retrograde filling of the left V4 segment which is markedly attenuated and irregular. Posterior inferior cerebral arteries not visualized.  Basilar artery patent to its distal aspect without stenosis. Superior cerebral arteries patent bilaterally. Right PCA supplied via the basilar. Hypoplastic left P1 with a prominent left posterior communicating artery. PCAs widely patent to their distal aspects without stenosis. Venous sinuses: Patent. Anatomic variants: None significant. Delayed phase: Not performed. Review of the MIP images confirms the above findings CT Brain Perfusion Findings: CBF (<30%) Volume: 37mL Perfusion (Tmax>6.0s) volume: 58mL Mismatch Volume: 95mL Infarction Location:Negative IMPRESSION: 1. Negative CTA for emergent large vessel occlusion. No acute infarct on CT perfusion. 2. Short-segment severe proximal right ICA stenosis measuring 75% by NASCET criteria. 3. Occlusion of the left vertebral artery at its origin. Single short-segment mild right V4 stenosis. Otherwise patent vertebrobasilar  system. These results were communicated to [facility] 1/8/2019by text page via the [facility] messaging system. Electronically Signed   By: Jeannine Boga M.D.   On: 02/09/2017 18:58   Ct Cerebral Perfusion W Contrast  Result Date: 02/09/2017 CLINICAL DATA:  Initial evaluation for acute right-sided weakness. Code stroke. EXAM: CT ANGIOGRAPHY HEAD AND NECK CT PERFUSION BRAIN TECHNIQUE: Multidetector CT imaging of the head and neck was performed using the standard protocol during bolus administration of intravenous contrast. Multiplanar CT image reconstructions and MIPs were obtained to evaluate the vascular anatomy. Carotid stenosis measurements (when applicable) are obtained utilizing NASCET criteria, using the distal internal carotid diameter as the denominator. Multiphase CT imaging of the brain was performed following IV bolus contrast injection. Subsequent parametric perfusion maps were calculated using RAPID software. CONTRAST:  156mL ISOVUE-370 IOPAMIDOL (ISOVUE-370) INJECTION 76% COMPARISON:  Prior noncontrast CT from earlier the same day. FINDINGS: CTA NECK FINDINGS Aortic arch: Visualized aortic arch of normal caliber with normal branch pattern. No flow-limiting stenosis about the origin of the great vessels. Visualized subclavian arteries widely patent. Right carotid system: Right common carotid artery patent from its origin to the bifurcation. Concentric soft plaque about the proximal right ICA with severe short-segment stenosis of approximately 75% by NASCET criteria (series 6, image 205). Right ICA mildly irregular but widely patent distally to the skull base. Left carotid system: Left common carotid artery patent from its origin to the bifurcation without stenosis. Atheromatous irregularity about the left bifurcation/proximal left ICA without hemodynamically significant stenosis. Left ICA patent distally to the skull base. Vertebral arteries: Both of the vertebral arteries arise from  the subclavian arteries. Left vertebral artery occluded at its origin and remains occluded within the neck. Right vertebral artery patent within the neck without flow-limiting stenosis, dissection, or occlusion. Skeleton: No acute osseous abnormality. No worrisome lytic or blastic osseous lesions. Moderate degenerative spondylolysis at C3-4 through C7-T1. Other neck: No acute soft tissue abnormality within the neck. Salivary glands normal. Thyroid normal. Upper chest: Upper chest within normal limits. Visualized lungs are clear. Review of the MIP images confirms the above findings CTA HEAD FINDINGS Anterior circulation: Petrous segments widely patent bilaterally. Mild for age atheromatous plaque within the carotid siphons without flow-limiting stenosis. ICA termini widely patent. Dominant and patent right A1 segment. Left A1 hypoplastic and patent as well. Normal anterior communicating artery. Anterior cerebral arteries patent to their distal aspects without stenosis. M1 segments patent without stenosis or occlusion. Normal MCA bifurcations. No proximal M2 occlusion. Distal MCA branches well perfused and symmetric. Posterior circulation: Focal plaque within the proximal right V4 segment with mild short-segment stenosis. Right vertebral artery otherwise patent to the vertebrobasilar junction without stenosis. Some retrograde filling of the left V4 segment which is markedly attenuated and irregular.  Posterior inferior cerebral arteries not visualized. Basilar artery patent to its distal aspect without stenosis. Superior cerebral arteries patent bilaterally. Right PCA supplied via the basilar. Hypoplastic left P1 with a prominent left posterior communicating artery. PCAs widely patent to their distal aspects without stenosis. Venous sinuses: Patent. Anatomic variants: None significant. Delayed phase: Not performed. Review of the MIP images confirms the above findings CT Brain Perfusion Findings: CBF (<30%) Volume: 51mL  Perfusion (Tmax>6.0s) volume: 67mL Mismatch Volume: 63mL Infarction Location:Negative IMPRESSION: 1. Negative CTA for emergent large vessel occlusion. No acute infarct on CT perfusion. 2. Short-segment severe proximal right ICA stenosis measuring 75% by NASCET criteria. 3. Occlusion of the left vertebral artery at its origin. Single short-segment mild right V4 stenosis. Otherwise patent vertebrobasilar system. These results were communicated to [facility] [place] 1/8/2019by text page via the [facility] messaging system. Electronically Signed   By: Jeannine Boga M.D.   On: 02/09/2017 18:58   Dg Chest Port 1 View  Result Date: 02/09/2017 CLINICAL DATA:  Fever and altered mental status.  Code sepsis. EXAM: PORTABLE CHEST 1 VIEW COMPARISON:  11/14/2016 FINDINGS: The heart size and mediastinal contours are within normal limits. No pneumonic consolidations or CH F. There is mild central vascular congestion. No effusion or pneumothorax. The visualized skeletal structures are unremarkable. IMPRESSION: Mild central vascular congestion without active pulmonary disease. Electronically Signed   By: Ashley Royalty M.D.   On: 02/09/2017 19:52   Ct Head Code Stroke Wo Contrast  Result Date: 02/09/2017 CLINICAL DATA:  Code stroke.  Code stroke with right-sided weakness. EXAM: CT HEAD WITHOUT CONTRAST TECHNIQUE: Contiguous axial images were obtained from the base of the skull through the vertex without intravenous contrast. COMPARISON:  MRI 11/14/2014 FINDINGS: Brain: Some motion degradation. The brain shows generalized atrophy. There is an old right occipital cortical and subcortical infarction. There is chronic ventriculomegaly secondary to central atrophy. No sign of acute infarction, mass lesion, hemorrhage or extra-axial collection. Vascular: There is atherosclerotic calcification of the major vessels at the base of the brain. Skull: Negative Sinuses/Orbits: Clear/normal Other: None ASPECTS ([place] Stroke Program  Early CT Score) - Ganglionic level infarction (caudate, lentiform nuclei, internal capsule, insula, M1-M3 cortex): 7 - Supraganglionic infarction (M4-M6 cortex): 3 Total score (0-10 with 10 being normal): 10 IMPRESSION: 1. No acute finding. Central atrophy with chronic ventriculomegaly. 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 2. ASPECTS is 10 3. These results were communicated to Dr. Leonel Ramsay at 5:58 pmon 1/8/2019by text page via the [facility] messaging system. Electronically Signed   By: Nelson Chimes M.D.   On: 02/09/2017 18:00    EKG: Independently reviewed.  Normal sinus rhythm with nonspecific ST-T changes.  Assessment/Plan Principal Problem:   TIA (transient ischemic attack) Active Problems:   Dementia without behavioral disturbance   Essential hypertension   Type 2 diabetes mellitus without complication (HCC)   Fever    1. TIA -appreciate neurology consult.  MRI brain has been ordered.  Since patient already had CT Angie of the head and neck no carotid Doppler has been ordered.  Check 2D echo.  Aspirin.  Physical therapy consult.  Patient has passed swallow.  Check hemoglobin A1c and lipid panel. 2. Fever -cause not clear.  Patient is empirically placed on antibiotics for now until cultures obtained.  Patient did have nausea vomiting but abdomen appears benign.  Flu panel is pending. 3. Nausea and vomiting -abdomen appears benign.  Has not had any further episodes after coming to the ER.  Closely observe.  Patient's wife states that she has been having diarrhea last week.  Probably viral gastroenteritis. 4. Diabetes mellitus type 2 -until patient can reliably eat I have decreased patient's home dose of Lantus.  Once patient can to eat change back to home dose of Lantus with sliding scale coverage. 5. History of dementia on neostigmine. 6. History of gait disorder on Sinemet. 7. Depression and anxiety on fluoxetine and Xanax.  Addendum -patient had an witnessed fall in the  ER.  Repeat CT head has been ordered.   DVT prophylaxis: Lovenox. Code Status: Full code. Family Communication: Discussed with wife. Disposition Plan: Home. Consults called: Neurology. Admission status: Observation.   Rise Patience MD [facility] Pager [phone].  If 7PM-7AM, please contact night-coverage [email or web address] Password [email or web address]  02/09/2017, 11:30 PM

## 2017-02-09 NOTE — ED Notes (Signed)
Assumed care on pt. , pt. resting , denies pain /respirations unlabored , IV sites unremarkable .

## 2017-02-09 NOTE — Progress Notes (Signed)
Pharmacy Antibiotic Note  DAVED MCFANN is a 83 y.o. male admitted on 02/09/2017 with sepsis.  Pharmacy has been consulted for vancomycin and zosyn dosing. Elevated WBC at 12.2, Scr 1.30, calc CrCl 52.  Plan: Vancomycin 2gm IV x1 Vancomycin 750mg  IV q12h. Goal trough 15-20. Zosyn 3.375g IV x1 Zosyn 3.375g IV q8h (4 hour infusion) F/u renal fxn, trough @ SS, clinical resolution  No data recorded.  Recent Labs  Lab 02/09/17 1745 02/09/17 1748  WBC 12.2*  --   CREATININE 1.49* 1.30*    CrCl cannot be calculated (Unknown ideal weight.).    No Known Allergies  Antimicrobials this admission: Vancomycin 1/8 >>  Zosyn 1/8 >>   Microbiology results: Pending  Thank you for allowing pharmacy to be a part of this patient's care.  Tranise Forrest 02/09/2017 6:43 PM

## 2017-02-09 NOTE — ED Triage Notes (Addendum)
To ED with stroke symptoms-- LSN 1300, woke up from nap at 1500, wife stated that pt was not talking normal, and had a right facial droop-- pt lives at home with wife.

## 2017-02-09 NOTE — Consult Note (Signed)
Neurology Consultation Reason for Consult: Altered mental status Referring Physician: Regenia Skeeter, S  CC: Confusion  History is obtained from: Family  HPI: Evan Vaughan is a 83 y.o. male patient with a history of diabetes who presents with confusion that started after a nap earlier today.  He does have a history of dementia, and laid down for nap after taking Phenergan, and his wife noticed him to be confused.  EMS was called, and they noticed some weakness as well and activated a code stroke.   On arrival to the emergency department, he had a mild right facial droop, right arm weakness, dense right hemianopia, marked aphasia.  He was outside the time window for IV TPA but a CT A/P was performed which was negative.  By the end of the CT perfusion, he was markedly improved with no hemianopia and markedly improved aphasia.   LKW: 1 PM tpa given?: no, outside of window   ROS: A 14 point ROS was performed and is negative except as noted in the HPI.   Past Medical History:  Diagnosis Date  . Bruises easily   . Diabetes mellitus without complication ([place])      Family History  Problem Relation Age of Onset  . Heart disease Mother   . Hypertension Father   . Heart attack Father   . Healthy Son   . Diabetes Other        sibling  . Heart Problems Other        sibling     Social History:  reports that  has never smoked. he has never used smokeless tobacco. He reports that he does not drink alcohol or use drugs.   Exam: Current vital signs: BP 122/72   Pulse 83   Temp (!) 102.2 F (39 C)   Resp 17   Ht 6\' 1"  (1.854 m)   Wt 83.9 kg (185 lb)   SpO2 100%   BMI 24.41 kg/m  Vital signs in last 24 hours: Temp:  [102.2 F (39 C)] 102.2 F (39 C) (01/08 1931) Pulse Rate:  [80-84] 83 (01/08 2000) Resp:  [17-25] 17 (01/08 2000) BP: (102-155)/(71-91) 122/72 (01/08 2000) SpO2:  [99 %-100 %] 100 % (01/08 2000) Weight:  [83.9 kg (185 lb)] 83.9 kg (185 lb) (01/08  1943)   Physical Exam  Constitutional: Appears well-developed and well-nourished.  Psych: Affect appropriate to situation Eyes: No scleral injection HENT: No OP obstrucion Head: Normocephalic.  Cardiovascular: Normal rate and regular rhythm.  Respiratory: Effort normal, non-labored breathing GI: Soft.  No distension. There is no tenderness.  Skin: WDI  Neuro: Mental Status: Patient is awake, alert, oriented to person, "hospital", gives month as February, age as 45.  His speech is initially unintelligible, both aphasic and dysarthric, but this rapidly improved. Cranial Nerves: II: Initially with right hemianopia, subsequently full fields. Pupils are equal, round, and reactive to light.   III,IV, VI: EOMI without ptosis or diploplia.  V: Facial sensation is symmetric to temperature VII: Facial movement initially with right facial weakness, but subsequently improved VIII: hearing is intact to voice X: Uvula elevates symmetrically XI: Shoulder shrug is symmetric. XII: tongue is midline without atrophy or fasciculations.  Motor: Tone is normal. Bulk is normal.  Initially with mild right arm weakness 4+/5, 5/5 strength in other extremities Sensory: Sensation is symmetric to light touch and temperature in the arms and legs.(This was not tested until after CT perfusion) Cerebellar: No clear ataxia on finger-nose-finger  I have reviewed labs  in epic and the results pertinent to this consultation are: Borderline creatinine Elevated glucose  I have reviewed the images obtained: CTA/P-negative  Impression: 83 year old male with transient right-sided weakness/aphasia which is improving.  He still seems confused, but I suspect that this is more due to Phenergan/illness in a demented patient.  The symptoms that were seen on arrival are most consistent with transient ischemic attack and I would work him up as such in addition to addressing his infectious issues.  Recommendations: 1. HgbA1c,  fasting lipid panel 2. MRI of the brain without contrast 3. Frequent neuro checks 4. Echocardiogram 5. Carotid dopplers are not needed given CTA of the head and neck 6. Prophylactic therapy-Antiplatelet med: Aspirin - dose 325mg  PO or 300mg  PR 7. Risk factor modification 8. Telemetry monitoring 9. PT consult, OT consult, Speech consult 10. please page stroke NP  Or  PA  Or MD  from 8am -4 pm as this patient will be followed by the stroke team at this point.   You can look them up on [email or web address]     Roland Rack, MD [facility] [phone]  If 7pm- 7am, please page neurology on call as listed in [place].

## 2017-02-10 ENCOUNTER — Observation Stay (HOSPITAL_COMMUNITY): Payer: Medicare Other

## 2017-02-10 ENCOUNTER — Observation Stay (HOSPITAL_BASED_OUTPATIENT_CLINIC_OR_DEPARTMENT_OTHER): Payer: Medicare Other

## 2017-02-10 DIAGNOSIS — E119 Type 2 diabetes mellitus without complications: Secondary | ICD-10-CM

## 2017-02-10 DIAGNOSIS — Z7984 Long term (current) use of oral hypoglycemic drugs: Secondary | ICD-10-CM | POA: Diagnosis not present

## 2017-02-10 DIAGNOSIS — E1165 Type 2 diabetes mellitus with hyperglycemia: Secondary | ICD-10-CM | POA: Diagnosis present

## 2017-02-10 DIAGNOSIS — F329 Major depressive disorder, single episode, unspecified: Secondary | ICD-10-CM | POA: Diagnosis present

## 2017-02-10 DIAGNOSIS — F0280 Dementia in other diseases classified elsewhere without behavioral disturbance: Secondary | ICD-10-CM | POA: Diagnosis not present

## 2017-02-10 DIAGNOSIS — Z79899 Other long term (current) drug therapy: Secondary | ICD-10-CM | POA: Diagnosis not present

## 2017-02-10 DIAGNOSIS — R410 Disorientation, unspecified: Secondary | ICD-10-CM | POA: Diagnosis present

## 2017-02-10 DIAGNOSIS — G20 Parkinson's disease: Secondary | ICD-10-CM | POA: Diagnosis not present

## 2017-02-10 DIAGNOSIS — G9389 Other specified disorders of brain: Secondary | ICD-10-CM | POA: Diagnosis present

## 2017-02-10 DIAGNOSIS — N183 Chronic kidney disease, stage 3 (moderate): Secondary | ICD-10-CM | POA: Diagnosis present

## 2017-02-10 DIAGNOSIS — R112 Nausea with vomiting, unspecified: Secondary | ICD-10-CM | POA: Diagnosis present

## 2017-02-10 DIAGNOSIS — R531 Weakness: Secondary | ICD-10-CM | POA: Diagnosis not present

## 2017-02-10 DIAGNOSIS — Z8249 Family history of ischemic heart disease and other diseases of the circulatory system: Secondary | ICD-10-CM | POA: Diagnosis not present

## 2017-02-10 DIAGNOSIS — Y92238 Other place in hospital as the place of occurrence of the external cause: Secondary | ICD-10-CM | POA: Diagnosis present

## 2017-02-10 DIAGNOSIS — I361 Nonrheumatic tricuspid (valve) insufficiency: Secondary | ICD-10-CM | POA: Diagnosis not present

## 2017-02-10 DIAGNOSIS — I10 Essential (primary) hypertension: Secondary | ICD-10-CM | POA: Diagnosis not present

## 2017-02-10 DIAGNOSIS — A084 Viral intestinal infection, unspecified: Secondary | ICD-10-CM | POA: Diagnosis present

## 2017-02-10 DIAGNOSIS — G459 Transient cerebral ischemic attack, unspecified: Secondary | ICD-10-CM | POA: Diagnosis not present

## 2017-02-10 DIAGNOSIS — R4182 Altered mental status, unspecified: Secondary | ICD-10-CM | POA: Diagnosis not present

## 2017-02-10 DIAGNOSIS — I129 Hypertensive chronic kidney disease with stage 1 through stage 4 chronic kidney disease, or unspecified chronic kidney disease: Secondary | ICD-10-CM | POA: Diagnosis present

## 2017-02-10 DIAGNOSIS — W19XXXA Unspecified fall, initial encounter: Secondary | ICD-10-CM | POA: Diagnosis present

## 2017-02-10 DIAGNOSIS — G309 Alzheimer's disease, unspecified: Secondary | ICD-10-CM | POA: Diagnosis not present

## 2017-02-10 DIAGNOSIS — F0390 Unspecified dementia without behavioral disturbance: Secondary | ICD-10-CM | POA: Diagnosis present

## 2017-02-10 DIAGNOSIS — E860 Dehydration: Secondary | ICD-10-CM | POA: Diagnosis present

## 2017-02-10 DIAGNOSIS — T426X5A Adverse effect of other antiepileptic and sedative-hypnotic drugs, initial encounter: Secondary | ICD-10-CM | POA: Diagnosis present

## 2017-02-10 DIAGNOSIS — E785 Hyperlipidemia, unspecified: Secondary | ICD-10-CM | POA: Diagnosis present

## 2017-02-10 DIAGNOSIS — Z794 Long term (current) use of insulin: Secondary | ICD-10-CM | POA: Diagnosis not present

## 2017-02-10 DIAGNOSIS — D631 Anemia in chronic kidney disease: Secondary | ICD-10-CM | POA: Diagnosis present

## 2017-02-10 DIAGNOSIS — F419 Anxiety disorder, unspecified: Secondary | ICD-10-CM | POA: Diagnosis present

## 2017-02-10 DIAGNOSIS — Z7982 Long term (current) use of aspirin: Secondary | ICD-10-CM | POA: Diagnosis not present

## 2017-02-10 DIAGNOSIS — R509 Fever, unspecified: Secondary | ICD-10-CM | POA: Diagnosis not present

## 2017-02-10 DIAGNOSIS — S0990XA Unspecified injury of head, initial encounter: Secondary | ICD-10-CM | POA: Diagnosis not present

## 2017-02-10 DIAGNOSIS — E1122 Type 2 diabetes mellitus with diabetic chronic kidney disease: Secondary | ICD-10-CM | POA: Diagnosis present

## 2017-02-10 LAB — COMPREHENSIVE METABOLIC PANEL
ALBUMIN: 3.1 g/dL — AB (ref 3.5–5.0)
ALT: 22 U/L (ref 17–63)
ANION GAP: 8 (ref 5–15)
AST: 24 U/L (ref 15–41)
Alkaline Phosphatase: 64 U/L (ref 38–126)
BUN: 22 mg/dL — AB (ref 6–20)
CHLORIDE: 102 mmol/L (ref 101–111)
CO2: 24 mmol/L (ref 22–32)
Calcium: 8.1 mg/dL — ABNORMAL LOW (ref 8.9–10.3)
Creatinine, Ser: 1.47 mg/dL — ABNORMAL HIGH (ref 0.61–1.24)
GFR calc Af Amer: 50 mL/min — ABNORMAL LOW (ref >60)
GFR calc non Af Amer: 43 mL/min — ABNORMAL LOW (ref >60)
GLUCOSE: 212 mg/dL — AB (ref 65–99)
Potassium: 4 mmol/L (ref 3.5–5.1)
Sodium: 134 mmol/L — ABNORMAL LOW (ref 135–145)
Total Bilirubin: 0.8 mg/dL (ref 0.3–1.2)
Total Protein: 5.9 g/dL — ABNORMAL LOW (ref 6.5–8.1)

## 2017-02-10 LAB — INFLUENZA PANEL BY PCR (TYPE A & B)
INFLAPCR: NEGATIVE
INFLBPCR: NEGATIVE

## 2017-02-10 LAB — CBC
HEMATOCRIT: 38.8 % — AB (ref 39.0–52.0)
HEMOGLOBIN: 12.3 g/dL — AB (ref 13.0–17.0)
MCH: 28 pg (ref 26.0–34.0)
MCHC: 31.7 g/dL (ref 30.0–36.0)
MCV: 88.4 fL (ref 78.0–100.0)
Platelets: 192 10*3/uL (ref 150–400)
RBC: 4.39 MIL/uL (ref 4.22–5.81)
RDW: 15 % (ref 11.5–15.5)
WBC: 11.2 10*3/uL — AB (ref 4.0–10.5)

## 2017-02-10 LAB — ECHOCARDIOGRAM COMPLETE
HEIGHTINCHES: 74 in
Weight: 2761.92 oz

## 2017-02-10 LAB — GLUCOSE, CAPILLARY
GLUCOSE-CAPILLARY: 153 mg/dL — AB (ref 65–99)
Glucose-Capillary: 223 mg/dL — ABNORMAL HIGH (ref 65–99)

## 2017-02-10 LAB — HEMOGLOBIN A1C
Hgb A1c MFr Bld: 8.4 % — ABNORMAL HIGH (ref 4.8–5.6)
Mean Plasma Glucose: 194.38 mg/dL

## 2017-02-10 LAB — CBG MONITORING, ED: Glucose-Capillary: 202 mg/dL — ABNORMAL HIGH (ref 65–99)

## 2017-02-10 MED ORDER — ASPIRIN EC 81 MG PO TBEC
81.0000 mg | DELAYED_RELEASE_TABLET | Freq: Every day | ORAL | Status: DC
  Administered 2017-02-11: 81 mg via ORAL
  Filled 2017-02-10: qty 1

## 2017-02-10 MED ORDER — LORAZEPAM 2 MG/ML IJ SOLN
0.2500 mg | Freq: Once | INTRAMUSCULAR | Status: DC
  Filled 2017-02-10: qty 1

## 2017-02-10 MED ORDER — ALPRAZOLAM 0.25 MG PO TABS
0.2500 mg | ORAL_TABLET | Freq: Every day | ORAL | Status: DC
  Filled 2017-02-10: qty 1

## 2017-02-10 MED ORDER — ALPRAZOLAM 0.25 MG PO TABS
0.2500 mg | ORAL_TABLET | Freq: Every day | ORAL | Status: DC
  Administered 2017-02-10 – 2017-02-11 (×2): 0.25 mg via ORAL
  Filled 2017-02-10: qty 1

## 2017-02-10 MED ORDER — VANCOMYCIN HCL IN DEXTROSE 750-5 MG/150ML-% IV SOLN
750.0000 mg | Freq: Two times a day (BID) | INTRAVENOUS | Status: DC
  Administered 2017-02-10: 750 mg via INTRAVENOUS
  Filled 2017-02-10: qty 150

## 2017-02-10 MED ORDER — FLUOXETINE HCL 20 MG PO CAPS
60.0000 mg | ORAL_CAPSULE | Freq: Every day | ORAL | Status: DC
  Administered 2017-02-10: 60 mg via ORAL
  Filled 2017-02-10 (×2): qty 3

## 2017-02-10 MED ORDER — INSULIN GLARGINE 100 UNIT/ML ~~LOC~~ SOLN
25.0000 [IU] | Freq: Two times a day (BID) | SUBCUTANEOUS | Status: DC
  Administered 2017-02-10 – 2017-02-11 (×2): 25 [IU] via SUBCUTANEOUS
  Filled 2017-02-10 (×4): qty 0.25

## 2017-02-10 MED ORDER — ONDANSETRON 4 MG PO TBDP: 4.0000 mg | ORAL_TABLET | Freq: Three times a day (TID) | ORAL | Status: DC | PRN

## 2017-02-10 NOTE — ED Notes (Signed)
Pt found on the naked on the floor. Unknown if pt hit head. Denies pain. VSS. Dr. Hal Hope aware.

## 2017-02-10 NOTE — ED Notes (Signed)
Attempted to call report

## 2017-02-10 NOTE — ED Notes (Signed)
Applied condom catheter with bed bag and leg strap, 46mm size

## 2017-02-10 NOTE — Care Management Obs Status (Signed)
[place] NOTIFICATION   Patient Details  Name: Evan Vaughan MRN: [id number] Date of Birth: [birth year]   Medicare Observation Status Notification Given:  Yes    Carles Collet, RN 02/10/2017, 2:28 PM

## 2017-02-10 NOTE — Progress Notes (Signed)
PROGRESS NOTE    Evan Vaughan  VOJ:[id number] DOB: 07/04/1934 DOA: 02/09/2017 PCP: Ernestene Kiel, MD   Outpatient Specialists:    Brief Narrative:  Evan Vaughan is a 83 y.o. male with history of diabetes mellitus type 2, dementia, depression, gait instability, hyperlipidemia was brought to the ER after patient was found to have confusion and some right-sided weakness.  As per the patient's wife patient had been having multiple episodes of nausea vomiting since waking up yesterday morning.  Following which patient was given Phenergan and patient's vomiting improved.  At around 1 PM.  Patient workup patient was found to be confused and noticed to have right facial droop and EMS was called. In the ER most of his symptoms have resolved.  CT head followed by CT angiogram of the head and neck was unremarkable.  Neurologist on-call was consulted patient admitted for further workup of TIA.  In the ER patient started developing fever of 102 F.  Chest x-ray UA are unremarkable.  Patient was empirically started on antibiotics for possible developing sepsis.  Flu panel is pending.  On exam patient abdomen appears benign and has had no further episodes of vomiting.     Assessment & Plan:   Principal Problem:   TIA (transient ischemic attack) Active Problems:   Dementia without behavioral disturbance   Essential hypertension   Type 2 diabetes mellitus without complication (HCC)   Fever   Fever/N/V/V -wife also sick -suspect viral etiology -d/c abx -monitor off all abx -IVF for dehydration  TIA like symptoms -MRI negative -suspect from meds (phenergan), dehydration/illness -seen by neuro -continue home meds but can finish work up with echo  Diabetes mellitus type 2  -SSI -lantus  History of dementia on neostigmine.  History of gait disorder on Sinemet.  Depression and anxiety on fluoxetine and Xanax-- takes after lunch      DVT prophylaxis:  Lovenox   Code  Status: Full Code   Family Communication: Wife at  bedside  Disposition Plan:  Home in the AM if no more fevers   Consultants:   neuro     Subjective: Feeling much better  Objective: Vitals:   02/10/17 0400 02/10/17 0500 02/10/17 0653 02/10/17 1057  BP: 124/79 115/76 (!) 147/67 121/70  Pulse: 81 74 69 69  Resp: (!) 23 18 20 16   Temp:   98.7 F (37.1 C) 98 F (36.7 C)  TempSrc:   Oral Oral  SpO2: 96% 93% 97% 99%  Weight:   78.3 kg (172 lb 9.9 oz)   Height:   6\' 2"  (1.88 m)     Intake/Output Summary (Last 24 hours) at 02/10/2017 1457 Last data filed at 02/10/2017 1415 Gross per 24 hour  Intake 2125 ml  Output 250 ml  Net 1875 ml   Filed Weights   02/09/17 1943 02/10/17 0653  Weight: 83.9 kg (185 lb) 78.3 kg (172 lb 9.9 oz)    Examination:  General exam: Appears calm and comfortable  Respiratory system: Clear to auscultation. Respiratory effort normal. Cardiovascular system: S1 & S2 heard, RRR. No JVD, murmurs, rubs, gallops or clicks. No pedal edema. Gastrointestinal system: Abdomen is nondistended, soft and nontender. No organomegaly or masses felt. Normal bowel sounds heard. Central nervous system: pleasant/cooperative-- follows commands Extremities: Symmetric 5 x 5 power. Skin: No rashes, lesions or ulcers Psychiatry: Judgement and insight appear normal. Mood & affect appropriate.     Data Reviewed: I have personally reviewed following labs and imaging studies  CBC: Recent  Labs  Lab 02/09/17 1745 02/09/17 1748 02/10/17 0327  WBC 12.2*  --  11.2*  NEUTROABS 10.3*  --   --   HGB 13.2 15.0 12.3*  HCT 41.7 44.0 38.8*  MCV 88.7  --  88.4  PLT 198  --  149   Basic Metabolic Panel: Recent Labs  Lab 02/09/17 1745 02/09/17 1748 02/10/17 0327  NA 132* 136 134*  K 4.4 4.4 4.0  CL 98* 97* 102  CO2 23  --  24  GLUCOSE 248* 244* 212*  BUN 23* 26* 22*  CREATININE 1.49* 1.30* 1.47*  CALCIUM 8.5*  --  8.1*   GFR: Estimated Creatinine Clearance:  44.4 mL/min (A) (by C-G formula based on SCr of 1.47 mg/dL (H)). Liver Function Tests: Recent Labs  Lab 02/09/17 1745 02/10/17 0327  AST 30 24  ALT 27 22  ALKPHOS 72 64  BILITOT 1.0 0.8  PROT 6.3* 5.9*  ALBUMIN 3.5 3.1*   No results for input(s): LIPASE, AMYLASE in the last 168 hours. No results for input(s): AMMONIA in the last 168 hours. Coagulation Profile: Recent Labs  Lab 02/09/17 1745  INR 0.96   Cardiac Enzymes: No results for input(s): CKTOTAL, CKMB, CKMBINDEX, TROPONINI in the last 168 hours. BNP (last 3 results) No results for input(s): PROBNP in the last 8760 hours. HbA1C: Recent Labs    02/10/17 0327  HGBA1C 8.4*   CBG: Recent Labs  Lab 02/09/17 1744 02/10/17 0320 02/10/17 1212  GLUCAP 238* 202* 223*   Lipid Profile: No results for input(s): CHOL, HDL, LDLCALC, TRIG, CHOLHDL, LDLDIRECT in the last 72 hours. Thyroid Function Tests: No results for input(s): TSH, T4TOTAL, FREET4, T3FREE, THYROIDAB in the last 72 hours. Anemia Panel: No results for input(s): VITAMINB12, FOLATE, FERRITIN, TIBC, IRON, RETICCTPCT in the last 72 hours. Urine analysis:    Component Value Date/Time   COLORURINE STRAW (A) 02/09/2017 1850   APPEARANCEUR CLEAR 02/09/2017 1850   LABSPEC >1.046 (H) 02/09/2017 1850   PHURINE 6.0 02/09/2017 1850   GLUCOSEU >=500 (A) 02/09/2017 1850   HGBUR NEGATIVE 02/09/2017 1850   BILIRUBINUR NEGATIVE 02/09/2017 1850   KETONESUR NEGATIVE 02/09/2017 1850   PROTEINUR NEGATIVE 02/09/2017 1850   NITRITE NEGATIVE 02/09/2017 1850   LEUKOCYTESUR NEGATIVE 02/09/2017 1850   Sepsis Labs: @LABRCNTIP (procalcitonin:4,lacticidven:4)  ) Recent Results (from the past 240 hour(s))  Blood Culture (routine x 2)     Status: None (Preliminary result)   Collection Time: 02/09/17  7:25 PM  Result Value Ref Range Status   Specimen Description BLOOD RIGHT ANTECUBITAL  Final   Special Requests   Final    BOTTLES DRAWN AEROBIC AND ANAEROBIC Blood Culture  adequate volume   Culture NO GROWTH < 24 HOURS  Final   Report Status PENDING  Incomplete  Blood Culture (routine x 2)     Status: None (Preliminary result)   Collection Time: 02/09/17  7:30 PM  Result Value Ref Range Status   Specimen Description BLOOD LEFT ANTECUBITAL  Final   Special Requests   Final    BOTTLES DRAWN AEROBIC ONLY Blood Culture adequate volume   Culture NO GROWTH < 24 HOURS  Final   Report Status PENDING  Incomplete      Anti-infectives (From admission, onward)   Start     Dose/Rate Route Frequency Ordered Stop   02/10/17 1000  vancomycin (VANCOCIN) IVPB 750 mg/150 ml premix  Status:  Discontinued     750 mg 150 mL/hr over 60 Minutes Intravenous Every 12 hours 02/10/17  0940 02/10/17 1008   02/10/17 0700  vancomycin (VANCOCIN) IVPB 750 mg/150 ml premix  Status:  Discontinued     750 mg 150 mL/hr over 60 Minutes Intravenous Every 12 hours 02/09/17 1842 02/10/17 0940   02/10/17 0230  piperacillin-tazobactam (ZOSYN) IVPB 3.375 g  Status:  Discontinued     3.375 g 12.5 mL/hr over 240 Minutes Intravenous Every 8 hours 02/09/17 1842 02/10/17 1008   02/09/17 1845  vancomycin (VANCOCIN) 2,000 mg in sodium chloride 0.9 % 500 mL IVPB     2,000 mg 250 mL/hr over 120 Minutes Intravenous  Once 02/09/17 1836 02/09/17 2139   02/09/17 1830  piperacillin-tazobactam (ZOSYN) IVPB 3.375 g     3.375 g 100 mL/hr over 30 Minutes Intravenous  Once 02/09/17 1824 02/09/17 2042   02/09/17 1830  vancomycin (VANCOCIN) IVPB 1000 mg/200 mL premix  Status:  Discontinued     1,000 mg 200 mL/hr over 60 Minutes Intravenous  Once 02/09/17 1824 02/09/17 1836       Radiology Studies: Ct Angio Head W Or Wo Contrast  Result Date: 02/09/2017 CLINICAL DATA:  Initial evaluation for acute right-sided weakness. Code stroke. EXAM: CT ANGIOGRAPHY HEAD AND NECK CT PERFUSION BRAIN TECHNIQUE: Multidetector CT imaging of the head and neck was performed using the standard protocol during bolus administration  of intravenous contrast. Multiplanar CT image reconstructions and MIPs were obtained to evaluate the vascular anatomy. Carotid stenosis measurements (when applicable) are obtained utilizing NASCET criteria, using the distal internal carotid diameter as the denominator. Multiphase CT imaging of the brain was performed following IV bolus contrast injection. Subsequent parametric perfusion maps were calculated using RAPID software. CONTRAST:  118mL ISOVUE-370 IOPAMIDOL (ISOVUE-370) INJECTION 76% COMPARISON:  Prior noncontrast CT from earlier the same day. FINDINGS: CTA NECK FINDINGS Aortic arch: Visualized aortic arch of normal caliber with normal branch pattern. No flow-limiting stenosis about the origin of the great vessels. Visualized subclavian arteries widely patent. Right carotid system: Right common carotid artery patent from its origin to the bifurcation. Concentric soft plaque about the proximal right ICA with severe short-segment stenosis of approximately 75% by NASCET criteria (series 6, image 205). Right ICA mildly irregular but widely patent distally to the skull base. Left carotid system: Left common carotid artery patent from its origin to the bifurcation without stenosis. Atheromatous irregularity about the left bifurcation/proximal left ICA without hemodynamically significant stenosis. Left ICA patent distally to the skull base. Vertebral arteries: Both of the vertebral arteries arise from the subclavian arteries. Left vertebral artery occluded at its origin and remains occluded within the neck. Right vertebral artery patent within the neck without flow-limiting stenosis, dissection, or occlusion. Skeleton: No acute osseous abnormality. No worrisome lytic or blastic osseous lesions. Moderate degenerative spondylolysis at C3-4 through C7-T1. Other neck: No acute soft tissue abnormality within the neck. Salivary glands normal. Thyroid normal. Upper chest: Upper chest within normal limits. Visualized  lungs are clear. Review of the MIP images confirms the above findings CTA HEAD FINDINGS Anterior circulation: Petrous segments widely patent bilaterally. Mild for age atheromatous plaque within the carotid siphons without flow-limiting stenosis. ICA termini widely patent. Dominant and patent right A1 segment. Left A1 hypoplastic and patent as well. Normal anterior communicating artery. Anterior cerebral arteries patent to their distal aspects without stenosis. M1 segments patent without stenosis or occlusion. Normal MCA bifurcations. No proximal M2 occlusion. Distal MCA branches well perfused and symmetric. Posterior circulation: Focal plaque within the proximal right V4 segment with mild short-segment stenosis. Right vertebral artery  otherwise patent to the vertebrobasilar junction without stenosis. Some retrograde filling of the left V4 segment which is markedly attenuated and irregular. Posterior inferior cerebral arteries not visualized. Basilar artery patent to its distal aspect without stenosis. Superior cerebral arteries patent bilaterally. Right PCA supplied via the basilar. Hypoplastic left P1 with a prominent left posterior communicating artery. PCAs widely patent to their distal aspects without stenosis. Venous sinuses: Patent. Anatomic variants: None significant. Delayed phase: Not performed. Review of the MIP images confirms the above findings CT Brain Perfusion Findings: CBF (<30%) Volume: 8mL Perfusion (Tmax>6.0s) volume: 47mL Mismatch Volume: 20mL Infarction Location:Negative IMPRESSION: 1. Negative CTA for emergent large vessel occlusion. No acute infarct on CT perfusion. 2. Short-segment severe proximal right ICA stenosis measuring 75% by NASCET criteria. 3. Occlusion of the left vertebral artery at its origin. Single short-segment mild right V4 stenosis. Otherwise patent vertebrobasilar system. These results were communicated to [facility] [place] 1/8/2019by text page via the [facility] messaging  system. Electronically Signed   By: Jeannine Boga M.D.   On: 02/09/2017 18:58   Ct Head Wo Contrast  Result Date: 02/10/2017 CLINICAL DATA:  Post unwitnessed fall in emergency department. EXAM: CT HEAD WITHOUT CONTRAST TECHNIQUE: Contiguous axial images were obtained from the base of the skull through the vertex without intravenous contrast. COMPARISON:  Brain MRI earlier this day.  Head CT yesterday. FINDINGS: Brain: No intracranial hemorrhage or evidence of acute ischemia. No subdural or extra-axial fluid collection. Unchanged atrophy and chronic small vessel ischemia. Small remote right occipital infarct. No mass effect or midline shift. Vascular: Atherosclerosis of skullbase vasculature without hyperdense vessel or abnormal calcification. Skull: No fracture or focal lesion. Sinuses/Orbits: Minimal debris in right side of sphenoid sinus, unchanged. Opacification of lower left mastoid air cells, unchanged. Other: None. IMPRESSION: No acute abnormality or change from prior exams. Electronically Signed   By: Jeb Levering M.D.   On: 02/10/2017 05:23   Ct Angio Neck W Or Wo Contrast  Result Date: 02/09/2017 CLINICAL DATA:  Initial evaluation for acute right-sided weakness. Code stroke. EXAM: CT ANGIOGRAPHY HEAD AND NECK CT PERFUSION BRAIN TECHNIQUE: Multidetector CT imaging of the head and neck was performed using the standard protocol during bolus administration of intravenous contrast. Multiplanar CT image reconstructions and MIPs were obtained to evaluate the vascular anatomy. Carotid stenosis measurements (when applicable) are obtained utilizing NASCET criteria, using the distal internal carotid diameter as the denominator. Multiphase CT imaging of the brain was performed following IV bolus contrast injection. Subsequent parametric perfusion maps were calculated using RAPID software. CONTRAST:  153mL ISOVUE-370 IOPAMIDOL (ISOVUE-370) INJECTION 76% COMPARISON:  Prior noncontrast CT from earlier the  same day. FINDINGS: CTA NECK FINDINGS Aortic arch: Visualized aortic arch of normal caliber with normal branch pattern. No flow-limiting stenosis about the origin of the great vessels. Visualized subclavian arteries widely patent. Right carotid system: Right common carotid artery patent from its origin to the bifurcation. Concentric soft plaque about the proximal right ICA with severe short-segment stenosis of approximately 75% by NASCET criteria (series 6, image 205). Right ICA mildly irregular but widely patent distally to the skull base. Left carotid system: Left common carotid artery patent from its origin to the bifurcation without stenosis. Atheromatous irregularity about the left bifurcation/proximal left ICA without hemodynamically significant stenosis. Left ICA patent distally to the skull base. Vertebral arteries: Both of the vertebral arteries arise from the subclavian arteries. Left vertebral artery occluded at its origin and remains occluded within the neck. Right vertebral artery  patent within the neck without flow-limiting stenosis, dissection, or occlusion. Skeleton: No acute osseous abnormality. No worrisome lytic or blastic osseous lesions. Moderate degenerative spondylolysis at C3-4 through C7-T1. Other neck: No acute soft tissue abnormality within the neck. Salivary glands normal. Thyroid normal. Upper chest: Upper chest within normal limits. Visualized lungs are clear. Review of the MIP images confirms the above findings CTA HEAD FINDINGS Anterior circulation: Petrous segments widely patent bilaterally. Mild for age atheromatous plaque within the carotid siphons without flow-limiting stenosis. ICA termini widely patent. Dominant and patent right A1 segment. Left A1 hypoplastic and patent as well. Normal anterior communicating artery. Anterior cerebral arteries patent to their distal aspects without stenosis. M1 segments patent without stenosis or occlusion. Normal MCA bifurcations. No proximal  M2 occlusion. Distal MCA branches well perfused and symmetric. Posterior circulation: Focal plaque within the proximal right V4 segment with mild short-segment stenosis. Right vertebral artery otherwise patent to the vertebrobasilar junction without stenosis. Some retrograde filling of the left V4 segment which is markedly attenuated and irregular. Posterior inferior cerebral arteries not visualized. Basilar artery patent to its distal aspect without stenosis. Superior cerebral arteries patent bilaterally. Right PCA supplied via the basilar. Hypoplastic left P1 with a prominent left posterior communicating artery. PCAs widely patent to their distal aspects without stenosis. Venous sinuses: Patent. Anatomic variants: None significant. Delayed phase: Not performed. Review of the MIP images confirms the above findings CT Brain Perfusion Findings: CBF (<30%) Volume: 6mL Perfusion (Tmax>6.0s) volume: 70mL Mismatch Volume: 60mL Infarction Location:Negative IMPRESSION: 1. Negative CTA for emergent large vessel occlusion. No acute infarct on CT perfusion. 2. Short-segment severe proximal right ICA stenosis measuring 75% by NASCET criteria. 3. Occlusion of the left vertebral artery at its origin. Single short-segment mild right V4 stenosis. Otherwise patent vertebrobasilar system. These results were communicated to [facility] at [place] 1/8/2019by text page via the [facility] messaging system. Electronically Signed   By: Jeannine Boga M.D.   On: 02/09/2017 18:58   [name]  Result Date: 02/10/2017 CLINICAL DATA:  Altered mental status. Right facial droop and right arm weakness. Dense right hemianopsia and aphasia. EXAM: MRI HEAD WITHOUT CONTRAST TECHNIQUE: Multiplanar, multiecho pulse sequences of the brain and surrounding structures were obtained without intravenous contrast. COMPARISON:  CTA head and neck, cerebral perfusion CT 02/09/2017 FINDINGS: Brain: The midline structures are normal. There is no  acute infarct or acute hemorrhage. Small, old right occipital infarct. No mass lesion, hydrocephalus, dural abnormality or extra-axial collection. Mild periventricular white matter hyperintensity. No lobar predominant atrophy. No chronic microhemorrhage or superficial siderosis. Vascular: Major intracranial arterial and venous sinus flow voids are preserved. Skull and upper cervical spine: The visualized skull base, calvarium, upper cervical spine and extracranial soft tissues are normal. Sinuses/Orbits: Left mastoid effusion. Paranasal sinuses are clear. Normal orbits . IMPRESSION: 1. No acute intracranial abnormality. 2. Generalized cerebral volume loss without lobar predilection. 3. Small left mastoid effusion. Electronically Signed   By: Ulyses Jarred M.D.   On: 02/10/2017 01:43   Ct Cerebral Perfusion W Contrast  Result Date: 02/09/2017 CLINICAL DATA:  Initial evaluation for acute right-sided weakness. Code stroke. EXAM: CT ANGIOGRAPHY HEAD AND NECK CT PERFUSION BRAIN TECHNIQUE: Multidetector CT imaging of the head and neck was performed using the standard protocol during bolus administration of intravenous contrast. Multiplanar CT image reconstructions and MIPs were obtained to evaluate the vascular anatomy. Carotid stenosis measurements (when applicable) are obtained utilizing NASCET criteria, using the distal internal carotid diameter as the denominator.  Multiphase CT imaging of the brain was performed following IV bolus contrast injection. Subsequent parametric perfusion maps were calculated using RAPID software. CONTRAST:  142mL ISOVUE-370 IOPAMIDOL (ISOVUE-370) INJECTION 76% COMPARISON:  Prior noncontrast CT from earlier the same day. FINDINGS: CTA NECK FINDINGS Aortic arch: Visualized aortic arch of normal caliber with normal branch pattern. No flow-limiting stenosis about the origin of the great vessels. Visualized subclavian arteries widely patent. Right carotid system: Right common carotid artery  patent from its origin to the bifurcation. Concentric soft plaque about the proximal right ICA with severe short-segment stenosis of approximately 75% by NASCET criteria (series 6, image 205). Right ICA mildly irregular but widely patent distally to the skull base. Left carotid system: Left common carotid artery patent from its origin to the bifurcation without stenosis. Atheromatous irregularity about the left bifurcation/proximal left ICA without hemodynamically significant stenosis. Left ICA patent distally to the skull base. Vertebral arteries: Both of the vertebral arteries arise from the subclavian arteries. Left vertebral artery occluded at its origin and remains occluded within the neck. Right vertebral artery patent within the neck without flow-limiting stenosis, dissection, or occlusion. Skeleton: No acute osseous abnormality. No worrisome lytic or blastic osseous lesions. Moderate degenerative spondylolysis at C3-4 through C7-T1. Other neck: No acute soft tissue abnormality within the neck. Salivary glands normal. Thyroid normal. Upper chest: Upper chest within normal limits. Visualized lungs are clear. Review of the MIP images confirms the above findings CTA HEAD FINDINGS Anterior circulation: Petrous segments widely patent bilaterally. Mild for age atheromatous plaque within the carotid siphons without flow-limiting stenosis. ICA termini widely patent. Dominant and patent right A1 segment. Left A1 hypoplastic and patent as well. Normal anterior communicating artery. Anterior cerebral arteries patent to their distal aspects without stenosis. M1 segments patent without stenosis or occlusion. Normal MCA bifurcations. No proximal M2 occlusion. Distal MCA branches well perfused and symmetric. Posterior circulation: Focal plaque within the proximal right V4 segment with mild short-segment stenosis. Right vertebral artery otherwise patent to the vertebrobasilar junction without stenosis. Some retrograde  filling of the left V4 segment which is markedly attenuated and irregular. Posterior inferior cerebral arteries not visualized. Basilar artery patent to its distal aspect without stenosis. Superior cerebral arteries patent bilaterally. Right PCA supplied via the basilar. Hypoplastic left P1 with a prominent left posterior communicating artery. PCAs widely patent to their distal aspects without stenosis. Venous sinuses: Patent. Anatomic variants: None significant. Delayed phase: Not performed. Review of the MIP images confirms the above findings CT Brain Perfusion Findings: CBF (<30%) Volume: 36mL Perfusion (Tmax>6.0s) volume: 23mL Mismatch Volume: 53mL Infarction Location:Negative IMPRESSION: 1. Negative CTA for emergent large vessel occlusion. No acute infarct on CT perfusion. 2. Short-segment severe proximal right ICA stenosis measuring 75% by NASCET criteria. 3. Occlusion of the left vertebral artery at its origin. Single short-segment mild right V4 stenosis. Otherwise patent vertebrobasilar system. These results were communicated to [facility] [place] 1/8/2019by text page via the [facility] messaging system. Electronically Signed   By: Jeannine Boga M.D.   On: 02/09/2017 18:58   Dg Chest Port 1 View  Result Date: 02/09/2017 CLINICAL DATA:  Fever and altered mental status.  Code sepsis. EXAM: PORTABLE CHEST 1 VIEW COMPARISON:  11/14/2016 FINDINGS: The heart size and mediastinal contours are within normal limits. No pneumonic consolidations or CH F. There is mild central vascular congestion. No effusion or pneumothorax. The visualized skeletal structures are unremarkable. IMPRESSION: Mild central vascular congestion without active pulmonary disease. Electronically Signed   By: Shanon Brow  Randel Pigg M.D.   On: 02/09/2017 19:52   Ct Head Code Stroke Wo Contrast  Result Date: 02/09/2017 CLINICAL DATA:  Code stroke.  Code stroke with right-sided weakness. EXAM: CT HEAD WITHOUT CONTRAST TECHNIQUE: Contiguous axial  images were obtained from the base of the skull through the vertex without intravenous contrast. COMPARISON:  MRI 11/14/2014 FINDINGS: Brain: Some motion degradation. The brain shows generalized atrophy. There is an old right occipital cortical and subcortical infarction. There is chronic ventriculomegaly secondary to central atrophy. No sign of acute infarction, mass lesion, hemorrhage or extra-axial collection. Vascular: There is atherosclerotic calcification of the major vessels at the base of the brain. Skull: Negative Sinuses/Orbits: Clear/normal Other: None ASPECTS ([place] Stroke Program Early CT Score) - Ganglionic level infarction (caudate, lentiform nuclei, internal capsule, insula, M1-M3 cortex): 7 - Supraganglionic infarction (M4-M6 cortex): 3 Total score (0-10 with 10 being normal): 10 IMPRESSION: 1. No acute finding. Central atrophy with chronic ventriculomegaly. 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 2. ASPECTS is 10 3. These results were communicated to Dr. Leonel Ramsay at 5:58 pmon 1/8/2019by text page via the [facility] messaging system. Electronically Signed   By: Nelson Chimes M.D.   On: 02/09/2017 18:00        Scheduled Meds: .  stroke: mapping our early stages of recovery book   Does not apply Once  . ALPRAZolam  0.25 mg Oral Daily  . [START ON 02/11/2017] aspirin EC  81 mg Oral Daily  . carbidopa-levodopa  1 tablet Oral TID WC  . colesevelam  1,875 mg Oral BID WC  . enoxaparin (LOVENOX) injection  40 mg Subcutaneous Q24H  . ezetimibe  10 mg Oral QHS  . FLUoxetine  60 mg Oral QHS  . insulin aspart  0-9 Units Subcutaneous TID WC  . insulin glargine  15 Units Subcutaneous BID  . LORazepam  0.25 mg Intravenous Once  . pantoprazole  40 mg Oral Daily  . pioglitazone  30 mg Oral Q breakfast  . rivastigmine  13.3 mg Transdermal Daily  . tamsulosin  0.4 mg Oral Daily   Continuous Infusions: . sodium chloride 75 mL/hr at 02/10/17 0338     LOS: 0 days    Time  spent: 25 min    Geradine Girt, DO [facility] Pager [phone]  If 7PM-7AM, please contact night-coverage [email or web address] Password [email or web address] 02/10/2017, 2:57 PM

## 2017-02-10 NOTE — Evaluation (Addendum)
Occupational Therapy Evaluation Patient Details Name: Evan Vaughan MRN: [id number] DOB: 1934/12/21 Today's Date: 02/10/2017    History of Present Illness 83 y.o. male with history of Parkinson's, diabetes mellitus type 2, dementia, depression, gait instability, hyperlipidemia was brought to the ER after patient was found to have confusion and some right-sided weakness. CT head followed by CT angiogram of the head and neck was unremarkable. MRI revealed No acute intracranial abnormality   Clinical Impression   Pt seen x 2 by OT as eval interrupted by pt meal.  He presents to OT with generalized weakness, decreased balance, and impaired cognition.  He currently requires min guard assist for ADLs.  Wife present and feels he is weaker than baseline, but is comfortable managing him at home.  PTA, he required set up assist with ADLs.  Discussed options for shower seat with wife, however, she is not interested at this time.   No further OT needs identified.  OT will sign off.     Follow Up Recommendations  No OT follow up;Supervision/Assistance - 24 hour    Equipment Recommendations  None recommended by OT    Recommendations for Other Services       Precautions / Restrictions Precautions Precautions: Fall Precaution Comments: hx of falling in past 6 months no fall since on Parkinson's medicine Restrictions Weight Bearing Restrictions: No      Mobility Bed Mobility Overal bed mobility: Modified Independent             General bed mobility comments: HoB elevated  Transfers Overall transfer level: Needs assistance Equipment used: Rolling walker (2 wheeled) Transfers: Sit to/from [facility] Sit to Stand: Min guard Stand pivot transfers: Min guard       General transfer comment: min guard assist for balance and safety     Balance Overall balance assessment: Needs assistance Sitting-balance support: No upper extremity supported;Feet supported Sitting  balance-Leahy Scale: Good     Standing balance support: During functional activity;No upper extremity supported Standing balance-Leahy Scale: Fair Standing balance comment: able to maintain static standing with min guard assist                             ADL either performed or assessed with clinical judgement   ADL Overall ADL's : Needs assistance/impaired Eating/Feeding: Set up   Grooming: Wash/dry hands;Wash/dry face;Oral care;Min guard;Standing   Upper Body Bathing: Supervision/ safety;Sitting   Lower Body Bathing: Min guard;Sit to/from stand   Upper Body Dressing : Supervision/safety;Sitting   Lower Body Dressing: Min guard;Sit to/from stand   Toilet Transfer: Min guard;Ambulation;Comfort height toilet;Grab bars   Toileting- Clothing Manipulation and Hygiene: Min guard;Sit to/from stand       Functional mobility during ADLs: Min guard;Minimal assistance;Rolling walker General ADL Comments: discussed options for shower seat for home.  Wife does not feel it will be necessary      Vision   Additional Comments: Pt unable to participate in formal assessment      Perception     Praxis      Pertinent Vitals/Pain Pain Assessment: No/denies pain     Hand Dominance     Extremity/Trunk Assessment Upper Extremity Assessment Upper Extremity Assessment: Overall WFL for tasks assessed   Lower Extremity Assessment Lower Extremity Assessment: Defer to PT evaluation RLE Deficits / Details: difficulty in initiating movement, strength grossly 4/5 once initiated, AROM of hip flexion limited RLE Sensation: (difficult to determine) RLE Coordination: decreased fine  motor       Communication Communication Communication: HOH   Cognition Arousal/Alertness: Awake/alert Behavior During Therapy: Anxious;Impulsive Overall Cognitive Status: History of cognitive impairments - at baseline                                 General Comments: oriented to  self.  Wife reports he is not at his baseline    General Comments  wife present and feels he is weaker than normal, but they have been through this before and therefore feels comfortable with managing him at home     Exercises     Shoulder Instructions      Home Living Family/patient expects to be discharged to:: Private residence Living Arrangements: Spouse/significant other Available Help at Discharge: Family;Available 24 hours/day Type of Home: Mobile home Home Access: Stairs to enter Entrance Stairs-Number of Steps: 2 Entrance Stairs-Rails: None Home Layout: One level     Bathroom Shower/Tub: Occupational psychologist: Standard Bathroom Accessibility: No   Home Equipment: Cane - single point          Prior Functioning/Environment Level of Independence: Needs assistance  Gait / Transfers Assistance Needed: independent without AD for limited community distances ADL's / Homemaking Assistance Needed: needs assist with set up during ADLs             OT Problem List: Decreased strength;Decreased activity tolerance;Impaired balance (sitting and/or standing);Decreased cognition;Decreased knowledge of use of DME or AE      OT Treatment/Interventions:      OT Goals(Current goals can be found in the care plan section) Acute Rehab OT Goals Patient Stated Goal: go home ASAP  OT Goal Formulation: All assessment and education complete, DC therapy  OT Frequency:     Barriers to D/C:            Co-evaluation              AM-PAC PT "6 Clicks" Daily Activity     Outcome Measure Help from another person eating meals?: A Little Help from another person taking care of personal grooming?: A Little Help from another person toileting, which includes using toliet, bedpan, or urinal?: A Little Help from another person bathing (including washing, rinsing, drying)?: A Little Help from another person to put on and taking off regular upper body clothing?: A  Little Help from another person to put on and taking off regular lower body clothing?: A Little 6 Click Score: 18   End of Session Equipment Utilized During Treatment: Rolling walker;Gait belt Nurse Communication: Mobility status  Activity Tolerance: Patient tolerated treatment well Patient left: in bed;with call bell/phone within reach;with bed alarm set;with family/visitor present  OT Visit Diagnosis: Unsteadiness on feet (R26.81);Cognitive communication deficit (R41.841)                Time: 9381-8299 and 3716-9678 OT Time Calculation (min): 14 min and 15 mins  Charges:  OT General Charges $OT Visit: 2 Visit OT Evaluation $OT Eval Moderate Complexity: 1 Mod OT Treatments $Self Care/Home Management : 8-22 mins G-Codes:     [facility], OTR/L [phone]   Lucille Passy M 02/10/2017, 12:51 PM

## 2017-02-10 NOTE — Progress Notes (Signed)
  Echocardiogram 2D Echocardiogram has been performed.  Evan Vaughan 02/10/2017, 3:50 PM

## 2017-02-10 NOTE — Progress Notes (Signed)
NEUROHOSPITALISTS STROKE TEAM - DAILY PROGRESS NOTE   ADMISSION HISTORY: Evan Vaughan is a 83 y.o. male patient with a history of diabetes who presents with confusion that started after a nap earlier today.  He does have a history of dementia, and laid down for nap after taking Phenergan, and his wife noticed him to be confused.  EMS was called, and they noticed some weakness as well and activated a code stroke.   On arrival to the emergency department, he had a mild right facial droop, right arm weakness, dense right hemianopia, marked aphasia.  He was outside the time window for IV TPA but a CT A/P was performed which was negative.  By the end of the CT perfusion, he was markedly improved with no hemianopia and markedly improved aphasia.  LKW: 1 PM tpa given?: no, outside of window  SUBJECTIVE (INTERVAL HISTORY) Wife is at the bedside. Patient is found laying in bed in NAD. Overall he feels his condition is completely resolved. Voices no new complaints. No new events reported overnight.   OBJECTIVE Lab Results: CBC:  Recent Labs  Lab 02/09/17 1745 02/09/17 1748 02/10/17 0327  WBC 12.2*  --  11.2*  HGB 13.2 15.0 12.3*  HCT 41.7 44.0 38.8*  MCV 88.7  --  88.4  PLT 198  --  192   BMP: Recent Labs  Lab 02/09/17 1745 02/09/17 1748 02/10/17 0327  NA 132* 136 134*  K 4.4 4.4 4.0  CL 98* 97* 102  CO2 23  --  24  GLUCOSE 248* 244* 212*  BUN 23* 26* 22*  CREATININE 1.49* 1.30* 1.47*  CALCIUM 8.5*  --  8.1*   Liver Function Tests:  Recent Labs  Lab 02/09/17 1745 02/10/17 0327  AST 30 24  ALT 27 22  ALKPHOS 72 64  BILITOT 1.0 0.8  PROT 6.3* 5.9*  ALBUMIN 3.5 3.1*   Coagulation Studies:  Recent Labs    02/09/17 1745  APTT 25  INR 0.96   PHYSICAL EXAM Temp:  [98 F (36.7 C)-102.2 F (39 C)] 98 F (36.7 C) (01/09 1057) Pulse Rate:  [69-96] 69 (01/09 1057) Resp:  [16-25] 16 (01/09 1057) BP:  (102-155)/(57-99) 121/70 (01/09 1057) SpO2:  [93 %-100 %] 99 % (01/09 1057) Weight:  [78.3 kg (172 lb 9.9 oz)-83.9 kg (185 lb)] 78.3 kg (172 lb 9.9 oz) (01/09 0653) General - Well nourished, well developed, in no apparent distress HEENT-  Normocephalic,  Cardiovascular - Regular rate and rhythm  Respiratory - Lungs clear bilaterally. No wheezing. Abdomen - soft and non-tender, BS normal Extremities- no edema or cyanosis Neuro: Mental Status: Patient is awake, alert, oriented to person, "hospital", gives month as February, age as 48.  His speech is slightly normal without dysarthria Cranial Nerves: II: \Visual fields intact bilaterally. Pupils are equal, round, and reactive to light.   III,IV, VI: EOMI without ptosis or diploplia.  V: Facial sensation is symmetric to temperature VII: Facial movement symmetrical.  VIII: hearing is intact to voice X: Uvula elevates symmetrically XI: Shoulder shrug is symmetric. XII: tongue is midline without atrophy or fasciculations.  Motor: Tone is normal. Bulk is normal.  5/5 strength in all extremities Sensory: Sensation is symmetric to light touch and temperature in the arms and leg Cerebellar: No clear ataxia on finger-nose-finger  IMAGING: I have personally reviewed the radiological images below and agree with the radiology interpretations. Ct Head Wo Contrast Result Date: 02/10/2017 IMPRESSION: No acute abnormality or change from prior  exams. Electronically Signed   By: Jeb Levering M.D.   On: 02/10/2017 05:23   [name] Result Date: 02/10/2017 IMPRESSION: 1. No acute intracranial abnormality. 2. Generalized cerebral volume loss without lobar predilection. 3. Small left mastoid effusion. Electronically Signed   By: Ulyses Jarred M.D.   On: 02/10/2017 01:43   Ct Cerebral Perfusion W Contrast Result Date: 02/09/2017 IMPRESSION: 1. Negative CTA for emergent large vessel occlusion. No acute infarct on CT perfusion. 2. Short-segment  severe proximal right ICA stenosis measuring 75% by NASCET criteria. 3. Occlusion of the left vertebral artery at its origin. Single short-segment mild right V4 stenosis. Otherwise patent vertebrobasilar system. These results were communicated to [facility] 1/8/2019by text page via the [facility] messaging system. Electronically Signed   By: Jeannine Boga M.D.   On: 02/09/2017 18:58   Ct Head Code Stroke Wo Contrast Result Date: 02/09/2017 IMPRESSION: 1. No acute finding. Central atrophy with chronic ventriculomegaly. 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 2. ASPECTS is 10 3. These results were communicated to Dr. Leonel Ramsay at 5:58 pmon 1/8/2019by text page via the [facility] messaging system. Electronically Signed   By: Nelson Chimes M.D.   On: 02/09/2017 18:00   Echocardiogram:                                              PENDING     IMPRESSION: Mr. Evan Vaughan is a 83 y.o. male with PMH of  diabetes mellitus type 2, dementia, depression, gait instability, hyperlipidemia was brought to the ER after patient was found to have confusion and some right-sided weakness and aphasia after administration of Phenergan and fever at home. Symptoms rapidly resolved in ED. MRI reveals no acute findings.   Transient neurological symptoms with fever and Phenergan administration doubt TIA Suspected Etiology: small vessel disease Resultant Symptoms:  right-sided weakness and aphasia  Stroke Risk Factors: diabetes mellitus, hyperlipidemia and hypertension Other Stroke Risk Factors: Advanced age, Hx stroke  Outstanding Stroke Work-up Studies:     Echocardiogram:                                                    PENDING  02/10/2017 ASSESSMENT:   Neuro exam non focal, Patient at his mentation baseline per wife. MRI negative for acute findings. Symptoms likely due to decompensation with fever and Phenergan.  Continue home dose ASA and Statin medications. Right ICA stenosis is not  symptomatic, may get outpatient workup. Follow up with Dr Delice Lesch Neurology.  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  Severe proximal right ICA stenosis measuring 75%  Asymptomatic Outpatient work up   Chronic Ventriculomegaly, Dementia and Parkinsons: Continue Sinemet at home dose Continue Exelon patch Follow up with Dr Aquino/outpatient Neurologist  HYPERTENSION: Stable Long term BP goal normotensive. May slowly start B/P medications, if needed Home Meds: NONE  HYPERLIPIDEMIA: LIPID PANEL PENDING No results  found for: CHOL, TRIG, HDL, CHOLHDL, VLDL, LDLCALC Home Meds:  Welchol and Zetia LDL  goal < 70 Continued on Welchol and Zetia, adjust doses per lipid panel findings Continue statin medications at discharge  DIABETES: Lab Results  Component Value Date   HGBA1C 8.4 (H) 02/10/2017  HgbA1c goal < 7.0 Currently on: Novolog Continue CBG monitoring and SSI to maintain glucose 140-180 mg/dl DM education   Other Active Problems: Principal Problem:   TIA (transient ischemic attack) Active Problems:   Dementia without behavioral disturbance   Essential hypertension   Type 2 diabetes mellitus without complication ([place])   Fever    Hospital day # 0 VTE prophylaxis: SCD's  Diet : Diet Carb Modified Fluid consistency: Thin; Room service appropriate? Yes   FAMILY UPDATES:  family at bedside  TEAM UPDATES: Vann, Jessica U, DO   Prior Home Stroke Medications:  aspirin 81 mg daily  Discharge Stroke Meds:  Please discharge patient on aspirin 81 mg daily   Disposition: Final  discharge disposition not confirmed Therapy Recs:               HOME Home Equipment:         TBD Follow Up:  Follow-up Information    Cameron Sprang, MD. Schedule an appointment as soon as possible for a visit in 6 week(s).   Specialty:  Neurology Contact information: [address] [place] [address] [phone]          Ernestene Kiel, MD -PCP Follow up in 1-2 weeks      Assessment & plan discussed with with attending physician and they are in agreement.    Mary Sella, ANP-C Stroke Neurology Team 02/10/2017 2:15 PM I have personally examined this patient, reviewed notes, independently viewed imaging studies, participated in medical decision making and plan of care.ROS completed by me personally and pertinent positives fully documented  I have made any additions or clarifications directly to the above note. Agree with note above.  Presented with transient focal neurological symptoms in the setting of fever and dehydration and after receiving Phenergan. He does have vascular risk factors but unclear as to the episode represented true TIA. Recommend aspirin and aggressive risk factor modification. Follow-up with his neurologist Dr. Charlotte Crumb. Greater than 50% time during this 25 minute visit was spent on counseling and coordination of care about his TIA-like symptoms Neurology to sign-off at this time. Please call with any further questions or concerns. Thank you for this consultation. Antony Contras, MD Medical Director [facility] Pager: [phone] 02/10/2017 4:07 PM   To contact Stroke Continuity provider, please refer to [email or web address] After hours, contact General Neurology

## 2017-02-10 NOTE — ED Notes (Signed)
Assumed care on pt. , pt. repositioned , bed linen changed , IV site intact , respirations unlabored ,

## 2017-02-10 NOTE — Evaluation (Signed)
Physical Therapy Evaluation Patient Details Name: Evan Vaughan MRN: [id number] DOB: 1934/06/07 Today's Date: 02/10/2017   History of Present Illness  83 y.o. male with history of Parkinson's, diabetes mellitus type 2, dementia, depression, gait instability, hyperlipidemia was brought to the ER after patient was found to have confusion and some right-sided weakness. CT head followed by CT angiogram of the head and neck was unremarkable. MRI revealed No acute intracranial abnormality  Clinical Impression  Pt admitted with above diagnosis. Pt currently with functional limitations due to the deficits listed below (see PT Problem List). PTA pt independent with ambulation without assistive device per wife. Pt is limited in his safe mobility by instability, decreased knowledge of RW use and decreased safety awareness. Pt is currently mod I for bed mobility, min guard for transfers and minA for ambulation of 150 feet with RW.  Pt will benefit from skilled PT to increase their independence and safety with mobility to allow discharge to the venue listed below.       Follow Up Recommendations Home health PT;Supervision/Assistance - 24 hour    Equipment Recommendations  Rolling walker with 5" wheels;3in1 (PT)    Recommendations for Other Services       Precautions / Restrictions Precautions Precautions: Fall Precaution Comments: hx of falling in past 6 months no fall since on Parkinson's medicine Restrictions Weight Bearing Restrictions: No      Mobility  Bed Mobility Overal bed mobility: Modified Independent             General bed mobility comments: HoB elevated  Transfers Overall transfer level: Needs assistance Equipment used: Rolling walker (2 wheeled) Transfers: Sit to/from Stand Sit to Stand: Min guard         General transfer comment: min guard for safety, required 2 attempts sucessful after scooting hips to EoB  Ambulation/Gait Ambulation/Gait assistance: Min  assist Ambulation Distance (Feet): 150 Feet Assistive device: Rolling walker (2 wheeled) Gait Pattern/deviations: Step-through pattern;Decreased step length - right;Decreased weight shift to right;Narrow base of support;Trunk flexed;Festinating;Decreased step length - left Gait velocity: slowed Gait velocity interpretation: Below normal speed for age/gender General Gait Details: minA for steadying with RW, 2x LoB requiring minA to maintain balance, pt unfamiliar with RW usage and needed max verbal cuing for proximity to RW and upright posture, small steps especially on R side, vc for increased step length   Stairs            Wheelchair Mobility    Modified Rankin (Stroke Patients Only) Modified Rankin (Stroke Patients Only) Pre-Morbid Rankin Score: Moderately severe disability Modified Rankin: No symptoms     Balance Overall balance assessment: Needs assistance Sitting-balance support: No upper extremity supported;Feet supported Sitting balance-Leahy Scale: Fair     Standing balance support: During functional activity;No upper extremity supported Standing balance-Leahy Scale: Fair Standing balance comment: able to stand without assist to fix tele leads                              Pertinent Vitals/Pain Pain Assessment: No/denies pain    Home Living Family/patient expects to be discharged to:: Private residence Living Arrangements: Spouse/significant other Available Help at Discharge: Family;Available 24 hours/day Type of Home: Mobile home Home Access: Stairs to enter Entrance Stairs-Rails: None Entrance Stairs-Number of Steps: 2 Home Layout: One level Home Equipment: Cane - single point      Prior Function Level of Independence: Needs assistance   Gait / Transfers Assistance  Needed: independent without AD for limited community distances  ADL's / Homemaking Assistance Needed: needs assist with iADLs secondary to dementia, limited assist with ADLs            Extremity/Trunk Assessment   Upper Extremity Assessment Upper Extremity Assessment: Defer to OT evaluation    Lower Extremity Assessment Lower Extremity Assessment: RLE deficits/detail RLE Deficits / Details: difficulty in initiating movement, strength grossly 4/5 once initiated, AROM of hip flexion limited RLE Sensation: (difficult to determine) RLE Coordination: decreased fine motor       Communication   Communication: HOH  Cognition Arousal/Alertness: Awake/alert Behavior During Therapy: Anxious Overall Cognitive Status: History of cognitive impairments - at baseline                                 General Comments: oriented to self       General Comments General comments (skin integrity, edema, etc.): Wife came in at very end of session and able to verify living conditions and prior level of function        Assessment/Plan    PT Assessment Patient needs continued PT services  PT Problem List Decreased range of motion;Decreased balance;Decreased mobility;Decreased coordination;Decreased knowledge of use of DME;Decreased safety awareness;Decreased knowledge of precautions;Decreased cognition       PT Treatment Interventions DME instruction;Gait training;Stair training;Functional mobility training;Therapeutic activities;Therapeutic exercise;Balance training;Cognitive remediation;Patient/family education    PT Goals (Current goals can be found in the Care Plan section)  Acute Rehab PT Goals Patient Stated Goal: go home PT Goal Formulation: With patient Time For Goal Achievement: 02/24/17 Potential to Achieve Goals: Good    Frequency Min 3X/week    AM-PAC PT "6 Clicks" Daily Activity  Outcome Measure Difficulty turning over in bed (including adjusting bedclothes, sheets and blankets)?: A Little Difficulty moving from lying on back to sitting on the side of the bed? : A Little Difficulty sitting down on and standing up from a chair with arms  (e.g., wheelchair, bedside commode, etc,.)?: A Little Help needed moving to and from a bed to chair (including a wheelchair)?: A Little Help needed walking in hospital room?: A Little Help needed climbing 3-5 steps with a railing? : A Little 6 Click Score: 18    End of Session Equipment Utilized During Treatment: Gait belt Activity Tolerance: Patient tolerated treatment well Patient left: in chair;with call bell/phone within reach;with chair alarm set;with family/visitor present Nurse Communication: Mobility status PT Visit Diagnosis: Unsteadiness on feet (R26.81);Other abnormalities of gait and mobility (R26.89);Difficulty in walking, not elsewhere classified (R26.2);Other symptoms and signs involving the nervous system (V03.500)    Time: 9381-8299 PT Time Calculation (min) (ACUTE ONLY): 32 min   Charges:   PT Evaluation $PT Eval Moderate Complexity: 1 Mod PT Treatments $Gait Training: 23-37 mins   PT G Codes:        Nakeita Styles B. Migdalia Dk PT, DPT Acute Rehabilitation  [phone] Pager [phone]    [place] 02/10/2017, 10:33 AM

## 2017-02-11 DIAGNOSIS — I10 Essential (primary) hypertension: Secondary | ICD-10-CM

## 2017-02-11 DIAGNOSIS — G20 Parkinson's disease: Secondary | ICD-10-CM

## 2017-02-11 LAB — LIPID PANEL
CHOLESTEROL: 165 mg/dL (ref 0–200)
HDL: 60 mg/dL (ref >40)
LDL CALC: 88 mg/dL (ref 0–99)
TRIGLYCERIDES: 86 mg/dL (ref <150)
Total CHOL/HDL Ratio: 2.8 RATIO
VLDL: 17 mg/dL (ref 0–40)

## 2017-02-11 LAB — URINE CULTURE: Culture: NO GROWTH

## 2017-02-11 LAB — BASIC METABOLIC PANEL
ANION GAP: 8 (ref 5–15)
BUN: 13 mg/dL (ref 6–20)
CHLORIDE: 106 mmol/L (ref 101–111)
CO2: 25 mmol/L (ref 22–32)
CREATININE: 1.26 mg/dL — AB (ref 0.61–1.24)
Calcium: 8.4 mg/dL — ABNORMAL LOW (ref 8.9–10.3)
GFR calc Af Amer: >60 mL/min (ref >60)
GFR calc non Af Amer: 52 mL/min — ABNORMAL LOW (ref >60)
Glucose, Bld: 80 mg/dL (ref 65–99)
POTASSIUM: 3.7 mmol/L (ref 3.5–5.1)
SODIUM: 139 mmol/L (ref 135–145)

## 2017-02-11 LAB — CBC
HEMATOCRIT: 38.4 % — AB (ref 39.0–52.0)
HEMOGLOBIN: 11.9 g/dL — AB (ref 13.0–17.0)
MCH: 27.8 pg (ref 26.0–34.0)
MCHC: 31 g/dL (ref 30.0–36.0)
MCV: 89.7 fL (ref 78.0–100.0)
Platelets: 180 10*3/uL (ref 150–400)
RBC: 4.28 MIL/uL (ref 4.22–5.81)
RDW: 15.2 % (ref 11.5–15.5)
WBC: 7.2 10*3/uL (ref 4.0–10.5)

## 2017-02-11 LAB — GLUCOSE, CAPILLARY
GLUCOSE-CAPILLARY: 127 mg/dL — AB (ref 65–99)
Glucose-Capillary: 77 mg/dL (ref 65–99)

## 2017-02-11 MED ORDER — CARBIDOPA-LEVODOPA 25-100 MG PO TABS: 1.0000 | ORAL_TABLET | Freq: Three times a day (TID) | ORAL | Status: DC

## 2017-02-11 NOTE — Discharge Summary (Signed)
Physician Discharge Summary  Evan Vaughan BHA:[id number] DOB: Jun 20, 1934  PCP: Ernestene Kiel, MD  Admit date: 02/09/2017 Discharge date: 02/11/2017  Recommendations for Outpatient Follow-up:  1. Dr. Ernestene Kiel, PCP in one week with repeat labs (CBC & BMP). 2. Dr. Ellouise Newer, Neurology in 6 weeks.  Home Health: PT. Equipment/Devices: Rolling walker with 5 inch wheels & 3 in 1.    Discharge Condition: Improved and stable.  CODE STATUS: Full.  Diet recommendation: Heart healthy & diabetic diet.  Discharge Diagnoses:  Principal Problem:   TIA (transient ischemic attack) Active Problems:   Dementia without behavioral disturbance   Essential hypertension   Type 2 diabetes mellitus without complication (HCC)   Fever   Brief Summary: 83 year old male with a PMH of DM 2/IDDM, dementia, anxiety & depression, gait instability, HLD was brought to the ED after he was found to have confusion and some right-sided weakness. As per patient's spouse, patient had been having multiple episodes of nausea, vomiting since waking up on the morning prior to ED visit. He was then given Phenergan and patient's vomiting improved. Later that afternoon patient was found to be confused and noticed to have right facial droop and EMS was called. By the time patient reached ED, his symptoms had resolved. CT head followed by CTA of the head and neck were unremarkable. Neurologist on call was consulted and patient was admitted for further workup of TIA. In the ED, patient developed fever off 102F. Chest x-ray, UA were unremarkable. He was empirically started on antibiotics for possible developed pink sepsis.  Assessment and plan:  1. Fever/nausea and vomiting: Patient's wife had also been sick with similar symptoms and recovered before him. While etiology was suspected. Empirically started antibiotics were discontinued. Urine culture negative. Blood cultures 2: Negative to date. Flu panel PCR negative.  Hydrated with IV fluids. Had remained afebrile for greater than 24 hours. No leukocytosis.? Acute viral GE. Resolved. 2. Confusion & TIA like symptoms: MRI negative. Neurologist was consulted. They indicated that patient presented with transient focal neurological symptoms in the setting of fever and dehydration and after receiving Phenergan. He does have vascular risk factors but it was unclear as to the episode representing true TIA. Neurology recommended continuing aspirin and aggressive risk factor modification and outpatient follow-up with neurology. 3. Type II DM/IDDM: Fluctuating and mildly uncontrolled in the hospital. A1c 8.4. Continue prior home dose of Lantus and Actos. 4. Hyperlipidemia: Continue home medications. 5. Gait disorder/Parkinson's disease: Continue home dose of Sinemet and outpatient follow-up with neurology. 6. Anxiety and depression: Continue home fluoxetine and Xanax. 7. Dementia: Patient was somewhat confused on day of discharge. Spouse felt that this was related to not getting his medications at their usual times that he gets them at home. She also felt that he would be better once he got back to his usual home surroundings. UDS and blood alcohol levels were negative. 8. Stage III chronic kidney disease: Baseline creatinine not known. Presented with creatinine of 1.49. This is improved to 1.26. Follow-up outpatient with PCP with repeat labs. 9. Anemia: May be from chronic disease or chronic kidney disease. Baseline not known. Presented with hemoglobin of 13.2 and dropped to 11.9 probably post hydration. Follow CBCs as outpatient. 10. Right ICA stenosis: Not symptomatic per neurology.   Consultations:  Neurology  Procedures:  2-D echo 02/10/17: Study Conclusions  - Left ventricle: The cavity size was normal. Wall thickness was   increased in a pattern of mild LVH. Systolic function was  normal.   The estimated ejection fraction was in the range of 55% to 60%.   Wall  motion was normal; there were no regional wall motion   abnormalities. Doppler parameters are consistent with abnormal   left ventricular relaxation (grade 1 diastolic dysfunction). - Aortic valve: There was trivial regurgitation. - Left atrium: The atrium was moderately dilated.   Discharge Instructions  Discharge Instructions    Ambulatory referral to Neurology   Complete by:  As directed    An appointment is requested in approximately: 6 weeks   Call MD for:   Complete by:  As directed    Worsening confusion. Recurrent strokelike symptoms.   Call MD for:  extreme fatigue   Complete by:  As directed    Call MD for:  persistant dizziness or light-headedness   Complete by:  As directed    Call MD for:  persistant nausea and vomiting   Complete by:  As directed    Call MD for:  temperature >100.4   Complete by:  As directed    Diet - low sodium heart healthy   Complete by:  As directed    Diet Carb Modified   Complete by:  As directed    Increase activity slowly   Complete by:  As directed        Medication List    TAKE these medications   ALPRAZolam 0.25 MG tablet Commonly known as:  XANAX Take 0.25 mg by mouth at bedtime.   aspirin EC 81 MG tablet Take 81 mg by mouth daily.   carbidopa-levodopa 25-100 MG tablet Commonly known as:  SINEMET IR Take 1 tablet by mouth 3 (three) times daily.   DUPIXENT 300 MG/2ML Sosy Generic drug:  Dupilumab Take one (1) inject as directed every two (2) weeks.   ezetimibe 10 MG tablet Commonly known as:  ZETIA Take 10 mg by mouth at bedtime.   FLUoxetine 20 MG capsule Commonly known as:  PROZAC Take 60 mg by mouth at bedtime. (Take with 40 mg = 60 mg total)   FLUoxetine 40 MG capsule Commonly known as:  PROZAC Take 40 mg by mouth at bedtime. (Take with 20 mg = 60 mg total)   hydrOXYzine 50 MG tablet Commonly known as:  ATARAX/VISTARIL Take 50 mg by mouth at bedtime as needed for itching.   LANTUS SOLOSTAR 100 UNIT/ML  Solostar Pen Generic drug:  Insulin Glargine Inject 25 Units into the skin 2 (two) times daily.   omeprazole 20 MG capsule Commonly known as:  PRILOSEC Take 20 mg by mouth daily.   pioglitazone 30 MG tablet Commonly known as:  ACTOS Take 30 mg by mouth every morning.   rivastigmine 13.3 MG/24HR Commonly known as:  EXELON Place 13.3 mg onto the skin daily.   tamsulosin 0.4 MG Caps capsule Commonly known as:  FLOMAX Take 0.4 mg by mouth daily.   WELCHOL 625 MG tablet Generic drug:  colesevelam Take 1,875 mg by mouth 2 (two) times daily with a meal. Take one (1) tablet (625 mg) each morning and two (2) tablets (1250 mg) each evening.      Follow-up Information    Cameron Sprang, MD. Schedule an appointment as soon as possible for a visit in 6 week(s).   Specialty:  Neurology Contact information: [address] [place] [address] [phone]        Ernestene Kiel, MD. Schedule an appointment as soon as possible for a visit in 1 week(s).  Specialty:  Internal Medicine Why:  To be seen with repeat labs (CBC & BMP). Contact information: [address] [place] [address] [phone]          No Known Allergies    Procedures/Studies: Ct Angio Head W Or Wo Contrast  Result Date: 02/09/2017 CLINICAL DATA:  Initial evaluation for acute right-sided weakness. Code stroke. EXAM: CT ANGIOGRAPHY HEAD AND NECK CT PERFUSION BRAIN TECHNIQUE: Multidetector CT imaging of the head and neck was performed using the standard protocol during bolus administration of intravenous contrast. Multiplanar CT image reconstructions and MIPs were obtained to evaluate the vascular anatomy. Carotid stenosis measurements (when applicable) are obtained utilizing NASCET criteria, using the distal internal carotid diameter as the denominator. Multiphase CT imaging of the brain was performed following IV bolus contrast injection. Subsequent parametric perfusion maps were calculated  using RAPID software. CONTRAST:  123mL ISOVUE-370 IOPAMIDOL (ISOVUE-370) INJECTION 76% COMPARISON:  Prior noncontrast CT from earlier the same day. FINDINGS: CTA NECK FINDINGS Aortic arch: Visualized aortic arch of normal caliber with normal branch pattern. No flow-limiting stenosis about the origin of the great vessels. Visualized subclavian arteries widely patent. Right carotid system: Right common carotid artery patent from its origin to the bifurcation. Concentric soft plaque about the proximal right ICA with severe short-segment stenosis of approximately 75% by NASCET criteria (series 6, image 205). Right ICA mildly irregular but widely patent distally to the skull base. Left carotid system: Left common carotid artery patent from its origin to the bifurcation without stenosis. Atheromatous irregularity about the left bifurcation/proximal left ICA without hemodynamically significant stenosis. Left ICA patent distally to the skull base. Vertebral arteries: Both of the vertebral arteries arise from the subclavian arteries. Left vertebral artery occluded at its origin and remains occluded within the neck. Right vertebral artery patent within the neck without flow-limiting stenosis, dissection, or occlusion. Skeleton: No acute osseous abnormality. No worrisome lytic or blastic osseous lesions. Moderate degenerative spondylolysis at C3-4 through C7-T1. Other neck: No acute soft tissue abnormality within the neck. Salivary glands normal. Thyroid normal. Upper chest: Upper chest within normal limits. Visualized lungs are clear. Review of the MIP images confirms the above findings CTA HEAD FINDINGS Anterior circulation: Petrous segments widely patent bilaterally. Mild for age atheromatous plaque within the carotid siphons without flow-limiting stenosis. ICA termini widely patent. Dominant and patent right A1 segment. Left A1 hypoplastic and patent as well. Normal anterior communicating artery. Anterior cerebral arteries  patent to their distal aspects without stenosis. M1 segments patent without stenosis or occlusion. Normal MCA bifurcations. No proximal M2 occlusion. Distal MCA branches well perfused and symmetric. Posterior circulation: Focal plaque within the proximal right V4 segment with mild short-segment stenosis. Right vertebral artery otherwise patent to the vertebrobasilar junction without stenosis. Some retrograde filling of the left V4 segment which is markedly attenuated and irregular. Posterior inferior cerebral arteries not visualized. Basilar artery patent to its distal aspect without stenosis. Superior cerebral arteries patent bilaterally. Right PCA supplied via the basilar. Hypoplastic left P1 with a prominent left posterior communicating artery. PCAs widely patent to their distal aspects without stenosis. Venous sinuses: Patent. Anatomic variants: None significant. Delayed phase: Not performed. Review of the MIP images confirms the above findings CT Brain Perfusion Findings: CBF (<30%) Volume: 89mL Perfusion (Tmax>6.0s) volume: 58mL Mismatch Volume: 24mL Infarction Location:Negative IMPRESSION: 1. Negative CTA for emergent large vessel occlusion. No acute infarct on CT perfusion. 2. Short-segment severe proximal right ICA stenosis measuring 75% by NASCET criteria. 3. Occlusion of the  left vertebral artery at its origin. Single short-segment mild right V4 stenosis. Otherwise patent vertebrobasilar system. These results were communicated to [facility] 1/8/2019by text page via the [facility] messaging system. Electronically Signed   By: Jeannine Boga M.D.   On: 02/09/2017 18:58   Ct Head Wo Contrast  Result Date: 02/10/2017 CLINICAL DATA:  Post unwitnessed fall in emergency department. EXAM: CT HEAD WITHOUT CONTRAST TECHNIQUE: Contiguous axial images were obtained from the base of the skull through the vertex without intravenous contrast. COMPARISON:  Brain MRI earlier this day.  Head CT yesterday.  FINDINGS: Brain: No intracranial hemorrhage or evidence of acute ischemia. No subdural or extra-axial fluid collection. Unchanged atrophy and chronic small vessel ischemia. Small remote right occipital infarct. No mass effect or midline shift. Vascular: Atherosclerosis of skullbase vasculature without hyperdense vessel or abnormal calcification. Skull: No fracture or focal lesion. Sinuses/Orbits: Minimal debris in right side of sphenoid sinus, unchanged. Opacification of lower left mastoid air cells, unchanged. Other: None. IMPRESSION: No acute abnormality or change from prior exams. Electronically Signed   By: Jeb Levering M.D.   On: 02/10/2017 05:23   Ct Angio Neck W Or Wo Contrast  Result Date: 02/09/2017 CLINICAL DATA:  Initial evaluation for acute right-sided weakness. Code stroke. EXAM: CT ANGIOGRAPHY HEAD AND NECK CT PERFUSION BRAIN TECHNIQUE: Multidetector CT imaging of the head and neck was performed using the standard protocol during bolus administration of intravenous contrast. Multiplanar CT image reconstructions and MIPs were obtained to evaluate the vascular anatomy. Carotid stenosis measurements (when applicable) are obtained utilizing NASCET criteria, using the distal internal carotid diameter as the denominator. Multiphase CT imaging of the brain was performed following IV bolus contrast injection. Subsequent parametric perfusion maps were calculated using RAPID software. CONTRAST:  159mL ISOVUE-370 IOPAMIDOL (ISOVUE-370) INJECTION 76% COMPARISON:  Prior noncontrast CT from earlier the same day. FINDINGS: CTA NECK FINDINGS Aortic arch: Visualized aortic arch of normal caliber with normal branch pattern. No flow-limiting stenosis about the origin of the great vessels. Visualized subclavian arteries widely patent. Right carotid system: Right common carotid artery patent from its origin to the bifurcation. Concentric soft plaque about the proximal right ICA with severe short-segment stenosis of  approximately 75% by NASCET criteria (series 6, image 205). Right ICA mildly irregular but widely patent distally to the skull base. Left carotid system: Left common carotid artery patent from its origin to the bifurcation without stenosis. Atheromatous irregularity about the left bifurcation/proximal left ICA without hemodynamically significant stenosis. Left ICA patent distally to the skull base. Vertebral arteries: Both of the vertebral arteries arise from the subclavian arteries. Left vertebral artery occluded at its origin and remains occluded within the neck. Right vertebral artery patent within the neck without flow-limiting stenosis, dissection, or occlusion. Skeleton: No acute osseous abnormality. No worrisome lytic or blastic osseous lesions. Moderate degenerative spondylolysis at C3-4 through C7-T1. Other neck: No acute soft tissue abnormality within the neck. Salivary glands normal. Thyroid normal. Upper chest: Upper chest within normal limits. Visualized lungs are clear. Review of the MIP images confirms the above findings CTA HEAD FINDINGS Anterior circulation: Petrous segments widely patent bilaterally. Mild for age atheromatous plaque within the carotid siphons without flow-limiting stenosis. ICA termini widely patent. Dominant and patent right A1 segment. Left A1 hypoplastic and patent as well. Normal anterior communicating artery. Anterior cerebral arteries patent to their distal aspects without stenosis. M1 segments patent without stenosis or occlusion. Normal MCA bifurcations. No proximal M2 occlusion. Distal MCA branches well  perfused and symmetric. Posterior circulation: Focal plaque within the proximal right V4 segment with mild short-segment stenosis. Right vertebral artery otherwise patent to the vertebrobasilar junction without stenosis. Some retrograde filling of the left V4 segment which is markedly attenuated and irregular. Posterior inferior cerebral arteries not visualized. Basilar  artery patent to its distal aspect without stenosis. Superior cerebral arteries patent bilaterally. Right PCA supplied via the basilar. Hypoplastic left P1 with a prominent left posterior communicating artery. PCAs widely patent to their distal aspects without stenosis. Venous sinuses: Patent. Anatomic variants: None significant. Delayed phase: Not performed. Review of the MIP images confirms the above findings CT Brain Perfusion Findings: CBF (<30%) Volume: 52mL Perfusion (Tmax>6.0s) volume: 73mL Mismatch Volume: 47mL Infarction Location:Negative IMPRESSION: 1. Negative CTA for emergent large vessel occlusion. No acute infarct on CT perfusion. 2. Short-segment severe proximal right ICA stenosis measuring 75% by NASCET criteria. 3. Occlusion of the left vertebral artery at its origin. Single short-segment mild right V4 stenosis. Otherwise patent vertebrobasilar system. These results were communicated to [facility] [place] 1/8/2019by text page via the [facility] messaging system. Electronically Signed   By: Jeannine Boga M.D.   On: 02/09/2017 18:58   [name]  Result Date: 02/10/2017 CLINICAL DATA:  Altered mental status. Right facial droop and right arm weakness. Dense right hemianopsia and aphasia. EXAM: MRI HEAD WITHOUT CONTRAST TECHNIQUE: Multiplanar, multiecho pulse sequences of the brain and surrounding structures were obtained without intravenous contrast. COMPARISON:  CTA head and neck, cerebral perfusion CT 02/09/2017 FINDINGS: Brain: The midline structures are normal. There is no acute infarct or acute hemorrhage. Small, old right occipital infarct. No mass lesion, hydrocephalus, dural abnormality or extra-axial collection. Mild periventricular white matter hyperintensity. No lobar predominant atrophy. No chronic microhemorrhage or superficial siderosis. Vascular: Major intracranial arterial and venous sinus flow voids are preserved. Skull and upper cervical spine: The visualized skull  base, calvarium, upper cervical spine and extracranial soft tissues are normal. Sinuses/Orbits: Left mastoid effusion. Paranasal sinuses are clear. Normal orbits . IMPRESSION: 1. No acute intracranial abnormality. 2. Generalized cerebral volume loss without lobar predilection. 3. Small left mastoid effusion. Electronically Signed   By: Ulyses Jarred M.D.   On: 02/10/2017 01:43   Ct Cerebral Perfusion W Contrast  Result Date: 02/09/2017 CLINICAL DATA:  Initial evaluation for acute right-sided weakness. Code stroke. EXAM: CT ANGIOGRAPHY HEAD AND NECK CT PERFUSION BRAIN TECHNIQUE: Multidetector CT imaging of the head and neck was performed using the standard protocol during bolus administration of intravenous contrast. Multiplanar CT image reconstructions and MIPs were obtained to evaluate the vascular anatomy. Carotid stenosis measurements (when applicable) are obtained utilizing NASCET criteria, using the distal internal carotid diameter as the denominator. Multiphase CT imaging of the brain was performed following IV bolus contrast injection. Subsequent parametric perfusion maps were calculated using RAPID software. CONTRAST:  164mL ISOVUE-370 IOPAMIDOL (ISOVUE-370) INJECTION 76% COMPARISON:  Prior noncontrast CT from earlier the same day. FINDINGS: CTA NECK FINDINGS Aortic arch: Visualized aortic arch of normal caliber with normal branch pattern. No flow-limiting stenosis about the origin of the great vessels. Visualized subclavian arteries widely patent. Right carotid system: Right common carotid artery patent from its origin to the bifurcation. Concentric soft plaque about the proximal right ICA with severe short-segment stenosis of approximately 75% by NASCET criteria (series 6, image 205). Right ICA mildly irregular but widely patent distally to the skull base. Left carotid system: Left common carotid artery patent from its origin to the bifurcation without stenosis.  Atheromatous irregularity about the left  bifurcation/proximal left ICA without hemodynamically significant stenosis. Left ICA patent distally to the skull base. Vertebral arteries: Both of the vertebral arteries arise from the subclavian arteries. Left vertebral artery occluded at its origin and remains occluded within the neck. Right vertebral artery patent within the neck without flow-limiting stenosis, dissection, or occlusion. Skeleton: No acute osseous abnormality. No worrisome lytic or blastic osseous lesions. Moderate degenerative spondylolysis at C3-4 through C7-T1. Other neck: No acute soft tissue abnormality within the neck. Salivary glands normal. Thyroid normal. Upper chest: Upper chest within normal limits. Visualized lungs are clear. Review of the MIP images confirms the above findings CTA HEAD FINDINGS Anterior circulation: Petrous segments widely patent bilaterally. Mild for age atheromatous plaque within the carotid siphons without flow-limiting stenosis. ICA termini widely patent. Dominant and patent right A1 segment. Left A1 hypoplastic and patent as well. Normal anterior communicating artery. Anterior cerebral arteries patent to their distal aspects without stenosis. M1 segments patent without stenosis or occlusion. Normal MCA bifurcations. No proximal M2 occlusion. Distal MCA branches well perfused and symmetric. Posterior circulation: Focal plaque within the proximal right V4 segment with mild short-segment stenosis. Right vertebral artery otherwise patent to the vertebrobasilar junction without stenosis. Some retrograde filling of the left V4 segment which is markedly attenuated and irregular. Posterior inferior cerebral arteries not visualized. Basilar artery patent to its distal aspect without stenosis. Superior cerebral arteries patent bilaterally. Right PCA supplied via the basilar. Hypoplastic left P1 with a prominent left posterior communicating artery. PCAs widely patent to their distal aspects without stenosis. Venous  sinuses: Patent. Anatomic variants: None significant. Delayed phase: Not performed. Review of the MIP images confirms the above findings CT Brain Perfusion Findings: CBF (<30%) Volume: 32mL Perfusion (Tmax>6.0s) volume: 15mL Mismatch Volume: 48mL Infarction Location:Negative IMPRESSION: 1. Negative CTA for emergent large vessel occlusion. No acute infarct on CT perfusion. 2. Short-segment severe proximal right ICA stenosis measuring 75% by NASCET criteria. 3. Occlusion of the left vertebral artery at its origin. Single short-segment mild right V4 stenosis. Otherwise patent vertebrobasilar system. These results were communicated to [facility] [place] 1/8/2019by text page via the [facility] messaging system. Electronically Signed   By: Jeannine Boga M.D.   On: 02/09/2017 18:58   Dg Chest Port 1 View  Result Date: 02/09/2017 CLINICAL DATA:  Fever and altered mental status.  Code sepsis. EXAM: PORTABLE CHEST 1 VIEW COMPARISON:  11/14/2016 FINDINGS: The heart size and mediastinal contours are within normal limits. No pneumonic consolidations or CH F. There is mild central vascular congestion. No effusion or pneumothorax. The visualized skeletal structures are unremarkable. IMPRESSION: Mild central vascular congestion without active pulmonary disease. Electronically Signed   By: Ashley Royalty M.D.   On: 02/09/2017 19:52   Ct Head Code Stroke Wo Contrast  Result Date: 02/09/2017 CLINICAL DATA:  Code stroke.  Code stroke with right-sided weakness. EXAM: CT HEAD WITHOUT CONTRAST TECHNIQUE: Contiguous axial images were obtained from the base of the skull through the vertex without intravenous contrast. COMPARISON:  MRI 11/14/2014 FINDINGS: Brain: Some motion degradation. The brain shows generalized atrophy. There is an old right occipital cortical and subcortical infarction. There is chronic ventriculomegaly secondary to central atrophy. No sign of acute infarction, mass lesion, hemorrhage or extra-axial collection.  Vascular: There is atherosclerotic calcification of the major vessels at the base of the brain. Skull: Negative Sinuses/Orbits: Clear/normal Other: None ASPECTS ([place] Stroke Program Early CT Score) - Ganglionic level infarction (caudate, lentiform nuclei, internal capsule,  insula, M1-M3 cortex): 7 - Supraganglionic infarction (M4-M6 cortex): 3 Total score (0-10 with 10 being normal): 10 IMPRESSION: 1. No acute finding. Central atrophy with chronic ventriculomegaly. 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 2. ASPECTS is 10 3. These results were communicated to Dr. Leonel Ramsay at 5:58 [facility] 1/8/2019by text page via the [facility] messaging system. Electronically Signed   By: Nelson Chimes M.D.   On: 02/09/2017 18:00      Subjective: Alert and oriented to person and partly to place. Pleasantly confused. Keeps repeating the same things again and again. Spouse at bedside, feels more confused than yesterday. No fever, nausea, vomiting, diarrhea or pain reported. Tolerating diet.  Discharge Exam:  Vitals:   02/10/17 2227 02/11/17 0130 02/11/17 0426 02/11/17 0944  BP: 115/82 122/79 120/80 (!) 92/58  Pulse: 62 65 60 63  Resp: 18 18 18 16   Temp: 97.6 F (36.4 C) 97.7 F (36.5 C) 97.9 F (36.6 C) 98 F (36.7 C)  TempSrc: Axillary Oral Axillary Oral  SpO2: 99% 99% 99% 100%  Weight:      Height:        General: Pleasant elderly male, moderately built and nourished, sitting up comfortably in bed. Oral mucosa moist. Cardiovascular: S1 & S2 heard, RRR. No murmurs, rubs, gallops or clicks. No JVD or pedal edema. Telemetry personally reviewed: Sinus bradycardia in the high 50s-sinus rhythm with first-degree AV block. Respiratory: Clear to auscultation without wheezing, rhonchi or crackles. No increased work of breathing. Abdominal:  Non distended, non tender & soft. No organomegaly or masses appreciated. Normal bowel sounds heard. CNS: Mental status as indicated above. No focal  deficits. Extremities: no edema, no cyanosis. Moves all extremities symmetrically and well.    The results of significant diagnostics from this hospitalization (including imaging, microbiology, ancillary and laboratory) are listed below for reference.     Microbiology: Recent Results (from the past 240 hour(s))  Blood Culture (routine x 2)     Status: None (Preliminary result)   Collection Time: 02/09/17  7:25 PM  Result Value Ref Range Status   Specimen Description BLOOD RIGHT ANTECUBITAL  Final   Special Requests   Final    BOTTLES DRAWN AEROBIC AND ANAEROBIC Blood Culture adequate volume   Culture NO GROWTH 2 DAYS  Final   Report Status PENDING  Incomplete  Blood Culture (routine x 2)     Status: None (Preliminary result)   Collection Time: 02/09/17  7:30 PM  Result Value Ref Range Status   Specimen Description BLOOD LEFT ANTECUBITAL  Final   Special Requests   Final    BOTTLES DRAWN AEROBIC ONLY Blood Culture adequate volume   Culture NO GROWTH 2 DAYS  Final   Report Status PENDING  Incomplete  Urine culture     Status: None   Collection Time: 02/09/17  8:23 PM  Result Value Ref Range Status   Specimen Description URINE, CLEAN CATCH  Final   Special Requests NONE  Final   Culture NO GROWTH  Final   Report Status 02/11/2017 FINAL  Final     Labs: CBC: Recent Labs  Lab 02/09/17 1745 02/09/17 1748 02/10/17 0327 02/11/17 0640  WBC 12.2*  --  11.2* 7.2  NEUTROABS 10.3*  --   --   --   HGB 13.2 15.0 12.3* 11.9*  HCT 41.7 44.0 38.8* 38.4*  MCV 88.7  --  88.4 89.7  PLT 198  --  192 401   Basic Metabolic Panel: Recent Labs  Lab 02/09/17  1745 02/09/17 1748 02/10/17 0327 02/11/17 0640  NA 132* 136 134* 139  K 4.4 4.4 4.0 3.7  CL 98* 97* 102 106  CO2 23  --  24 25  GLUCOSE 248* 244* 212* 80  BUN 23* 26* 22* 13  CREATININE 1.49* 1.30* 1.47* 1.26*  CALCIUM 8.5*  --  8.1* 8.4*   Liver Function Tests: Recent Labs  Lab 02/09/17 1745 02/10/17 0327  AST 30 24   ALT 27 22  ALKPHOS 72 64  BILITOT 1.0 0.8  PROT 6.3* 5.9*  ALBUMIN 3.5 3.1*   CBG: Recent Labs  Lab 02/10/17 0320 02/10/17 1212 02/10/17 1703 02/11/17 0744 02/11/17 1054  GLUCAP 202* 223* 153* 77 127*   Hgb A1c Recent Labs    02/10/17 0327  HGBA1C 8.4*   Lipid Profile Recent Labs    02/11/17 0700  CHOL 165  HDL 60  LDLCALC 88  TRIG 86  CHOLHDL 2.8   Urinalysis    Component Value Date/Time   COLORURINE STRAW (A) 02/09/2017 1850   APPEARANCEUR CLEAR 02/09/2017 1850   LABSPEC >1.046 (H) 02/09/2017 1850   PHURINE 6.0 02/09/2017 1850   GLUCOSEU >=500 (A) 02/09/2017 1850   HGBUR NEGATIVE 02/09/2017 1850   BILIRUBINUR NEGATIVE 02/09/2017 1850   KETONESUR NEGATIVE 02/09/2017 1850   PROTEINUR NEGATIVE 02/09/2017 1850   NITRITE NEGATIVE 02/09/2017 1850   LEUKOCYTESUR NEGATIVE 02/09/2017 1850   Discussed in detail with patient's spouse at bedside. Updated care and answered questions.   Time coordinating discharge: Over 30 minutes  SIGNED:  Vernell Leep, MD, FACP, [facility]. [facility] Pager [phone]  If 7PM-7AM, please contact night-coverage [email or web address] Password [email or web address] 02/11/2017, 3:11 PM

## 2017-02-11 NOTE — Progress Notes (Signed)
Physical Therapy Treatment Patient Details Name: Evan Vaughan MRN: [id number] DOB: [birth year] Today's Date: 02/11/2017    History of Present Illness 83 y.o. male with history of Parkinson's, diabetes mellitus type 2, dementia, depression, gait instability, hyperlipidemia was brought to the ER after patient was found to have confusion and some right-sided weakness. CT head followed by CT angiogram of the head and neck was unremarkable. MRI revealed No acute intracranial abnormality    PT Comments    Patient received sitting at EOB with wife present, pleasant and willing to participate in PT this afternoon. Wife and patient report his baseline is to ambulate with no assistive device, thus trialed gait in unit with no device this afternoon, noting mild general unsteadiness and gait deviation requiring min guard for balance and safety, cues to improve step lengths and reduce scissoring pattern. Patient left sitting at EOB with wife present, all questions/concerns addressed and needs met this session. Both wife and patient feel confident in return home at this time.     Follow Up Recommendations  Home health PT;Supervision/Assistance - 24 hour     Equipment Recommendations  Rolling walker with 5" wheels;3in1 (PT)    Recommendations for Other Services       Precautions / Restrictions Precautions Precautions: Fall Precaution Comments: hx of falling in past 6 months no fall since on Parkinson's medicine Restrictions Weight Bearing Restrictions: No    Mobility  Bed Mobility               General bed mobility comments: DNT, received sitting up at EOB   Transfers Overall transfer level: Needs assistance Equipment used: None Transfers: Sit to/from Stand Sit to Stand: Min guard         General transfer comment: impulsive with this transfer, requring min guard   Ambulation/Gait Ambulation/Gait assistance: Min guard Ambulation Distance (Feet): 250 Feet Assistive device:  None Gait Pattern/deviations: Step-through pattern;Decreased step length - right;Decreased step length - left;Narrow base of support;Scissoring;Trunk flexed     General Gait Details: did not use assistive device today as patient and wife report this is his baseline; able to ambulate though unit with mild unsteadiness and scissoring but did not require additional support from PT, VC provided to correct gait mechanics including step length. Patient requires cues for navigation in unit.    Stairs            Wheelchair Mobility    Modified Rankin (Stroke Patients Only)       Balance Overall balance assessment: Needs assistance Sitting-balance support: No upper extremity supported;Feet supported Sitting balance-Leahy Scale: Good     Standing balance support: During functional activity;No upper extremity supported Standing balance-Leahy Scale: Fair Standing balance comment: mild unsteadiness with gait with no device requiring min guard                             Cognition Arousal/Alertness: Awake/alert Behavior During Therapy: Impulsive Overall Cognitive Status: History of cognitive impairments - at baseline                                        Exercises      General Comments        Pertinent Vitals/Pain Pain Assessment: No/denies pain    Home Living  Prior Function            PT Goals (current goals can now be found in the care plan section) Acute Rehab PT Goals Patient Stated Goal: go home ASAP  PT Goal Formulation: With patient Time For Goal Achievement: 02/24/17 Potential to Achieve Goals: Good Progress towards PT goals: Progressing toward goals    Frequency    Min 3X/week      PT Plan Current plan remains appropriate    Co-evaluation              AM-PAC PT "6 Clicks" Daily Activity  Outcome Measure  Difficulty turning over in bed (including adjusting bedclothes, sheets and  blankets)?: None Difficulty moving from lying on back to sitting on the side of the bed? : None Difficulty sitting down on and standing up from a chair with arms (e.g., wheelchair, bedside commode, etc,.)?: None Help needed moving to and from a bed to chair (including a wheelchair)?: A Little Help needed walking in hospital room?: A Little Help needed climbing 3-5 steps with a railing? : A Little 6 Click Score: 21    End of Session Equipment Utilized During Treatment: Gait belt Activity Tolerance: Patient tolerated treatment well Patient left: in bed;with call bell/phone within reach;with family/visitor present   PT Visit Diagnosis: Unsteadiness on feet (R26.81);Other abnormalities of gait and mobility (R26.89);Difficulty in walking, not elsewhere classified (R26.2);Other symptoms and signs involving the nervous system (R29.898)     Time: 8871-9597 PT Time Calculation (min) (ACUTE ONLY): 9 min  Charges:  $Gait Training: 8-22 mins                    G Codes:  Functional Assessment Tool Used: AM-PAC 6 Clicks Basic Mobility;Clinical judgement    Deniece Ree PT, DPT, CBIS  Supplemental Physical Therapist [place]   Pager [phone]

## 2017-02-11 NOTE — Progress Notes (Signed)
  Speech Language Pathology   Patient Details Name: Evan Vaughan MRN: [id number] DOB: 1934-03-29 Today's Date: 02/11/2017 Time: 1018-     Attempted SLE x 2 earlier today. Will continue efforts                       GO                Houston Siren 02/11/2017, 1:01 PM Orbie Pyo Colvin Caroli.Ed [employer] [phone]

## 2017-02-11 NOTE — Progress Notes (Signed)
Discharge orders received.  Discharge instructions and follow-up appointments reviewed with the patient.  VSS upon discharge.  IV removed and education complete.  Transported out via wheelchair.   Amiylah Anastos M, RN 

## 2017-02-11 NOTE — Care Management Note (Signed)
Case Management Note  Patient Details  Name: Evan Vaughan MRN: [id number] Date of Birth: January 06, 1935  Subjective/Objective:                    Action/Plan: Pt discharging home with [facility] services. CM provided choice of [place] agencies. Mrs [place] chose [facility]. Cory with [facility] notified and accepted the referral.  Pt's wife to provide transportation home.   Expected Discharge Date:  02/11/17               Expected Discharge Plan:  [place]  In-House Referral:     Discharge planning Services  CM Consult  Post Acute Care Choice:  Home Health, Durable Medical Equipment Choice offered to:  Spouse  DME Arranged:  3-N-1, Walker rolling DME Agency:  [place]:  [place]:  [place]  Status of Service:  Completed, signed off  If discussed at [place] of Stay Meetings, dates discussed:    Additional Comments:  Pollie Friar, RN 02/11/2017, 3:53 PM

## 2017-02-11 NOTE — Progress Notes (Signed)
Inpatient Diabetes Program Recommendations  AACE/ADA: New Consensus Statement on Inpatient Glycemic Control (2015)  Target Ranges:  Prepandial:   less than 140 mg/dL      Peak postprandial:   less than 180 mg/dL (1-2 hours)      Critically ill patients:  140 - 180 mg/dL   Results for MYERS, TUTTEROW (MRN [id number]) as of 02/11/2017 11:57  Ref. Range 02/10/2017 03:20 02/10/2017 12:12 02/10/2017 17:03 02/11/2017 07:44 02/11/2017 10:54  Glucose-Capillary Latest Ref Range: 65 - 99 mg/dL 202 (H)  Lantus 15 units 223 (H)  Novolog 3 units  Actos 30 mg 153 (H)  Novolog 2 units  Lantus 25 units @20 :52 77 127 (H)   Review of Glycemic Control  Diabetes history: DM2 Outpatient Diabetes medications: Lantus 25 units BID, Actos 30 mg QAM Current orders for Inpatient glycemic control: Lantus 25 units BID, Actos 30 mg QAM, Novolog 0-9 units TID with meals  Inpatient Diabetes Program Recommendations: Insulin - Basal: Patient received a total of Lantus 40 units on 02/10/17 and fasting glucose 77 mg/dl this morning. Please consider decreasing Lantus to 20 units BID. Oral Agents: May want to consider discontinuing Actos while inpatient.  Thanks, Barnie Alderman, RN, MSN, CDE Diabetes Coordinator Inpatient Diabetes Program [phone] (Team Pager from 8am to 5pm)

## 2017-02-12 DIAGNOSIS — F0280 Dementia in other diseases classified elsewhere without behavioral disturbance: Secondary | ICD-10-CM | POA: Diagnosis not present

## 2017-02-12 DIAGNOSIS — N183 Chronic kidney disease, stage 3 (moderate): Secondary | ICD-10-CM | POA: Diagnosis not present

## 2017-02-12 DIAGNOSIS — I129 Hypertensive chronic kidney disease with stage 1 through stage 4 chronic kidney disease, or unspecified chronic kidney disease: Secondary | ICD-10-CM | POA: Diagnosis not present

## 2017-02-12 DIAGNOSIS — I6529 Occlusion and stenosis of unspecified carotid artery: Secondary | ICD-10-CM | POA: Diagnosis not present

## 2017-02-12 DIAGNOSIS — F329 Major depressive disorder, single episode, unspecified: Secondary | ICD-10-CM | POA: Diagnosis not present

## 2017-02-12 DIAGNOSIS — F419 Anxiety disorder, unspecified: Secondary | ICD-10-CM | POA: Diagnosis not present

## 2017-02-12 DIAGNOSIS — G20 Parkinson's disease: Secondary | ICD-10-CM | POA: Diagnosis not present

## 2017-02-12 DIAGNOSIS — E1122 Type 2 diabetes mellitus with diabetic chronic kidney disease: Secondary | ICD-10-CM | POA: Diagnosis not present

## 2017-02-12 NOTE — Consult Note (Addendum)
            [facility] CM Primary Care Navigator  02/12/2017  NUR KRASINSKI [birth year] [id number]   Went to seepatient at the bedsideto identify possible discharge needs buthe wasalready dischargedper staffreport.   Per MD note, patient was admittedfor further work-up of TIA (transient ischemic attack) related to confusion and right facial droop.  Patient was discharged homeyesterday with home health services per therapy recommendation.  Primary care provider's office called to notify of patient's discharge and need for post hospital follow-up and transition of care. Notified of health issues needing follow-upas well. Made aware to refer patient to [facility] care management if deemed necessary or appropriate for services.  Patient has discharge instruction to follow-up withprimary care providerwithin 1 week and follow-up with neurologist in 6 weeks.   For additional questions please contact:  Edwena Felty A. Dezire Turk, BSN, RN-BC [facility] PRIMARY CARE Navigator Cell: [phone]

## 2017-02-14 LAB — CULTURE, BLOOD (ROUTINE X 2)
Culture: NO GROWTH
Culture: NO GROWTH
SPECIAL REQUESTS: ADEQUATE
Special Requests: ADEQUATE

## 2017-02-15 DIAGNOSIS — F0280 Dementia in other diseases classified elsewhere without behavioral disturbance: Secondary | ICD-10-CM | POA: Diagnosis not present

## 2017-02-15 DIAGNOSIS — G20 Parkinson's disease: Secondary | ICD-10-CM | POA: Diagnosis not present

## 2017-02-17 DIAGNOSIS — F0280 Dementia in other diseases classified elsewhere without behavioral disturbance: Secondary | ICD-10-CM | POA: Diagnosis not present

## 2017-02-17 DIAGNOSIS — G20 Parkinson's disease: Secondary | ICD-10-CM | POA: Diagnosis not present

## 2017-02-19 DIAGNOSIS — F0280 Dementia in other diseases classified elsewhere without behavioral disturbance: Secondary | ICD-10-CM | POA: Diagnosis not present

## 2017-02-19 DIAGNOSIS — G20 Parkinson's disease: Secondary | ICD-10-CM | POA: Diagnosis not present

## 2017-02-22 DIAGNOSIS — F0280 Dementia in other diseases classified elsewhere without behavioral disturbance: Secondary | ICD-10-CM | POA: Diagnosis not present

## 2017-02-22 DIAGNOSIS — G20 Parkinson's disease: Secondary | ICD-10-CM | POA: Diagnosis not present

## 2017-02-23 DIAGNOSIS — G20 Parkinson's disease: Secondary | ICD-10-CM | POA: Diagnosis not present

## 2017-02-23 DIAGNOSIS — G309 Alzheimer's disease, unspecified: Secondary | ICD-10-CM | POA: Diagnosis not present

## 2017-02-23 DIAGNOSIS — E785 Hyperlipidemia, unspecified: Secondary | ICD-10-CM | POA: Diagnosis not present

## 2017-02-23 DIAGNOSIS — Z79899 Other long term (current) drug therapy: Secondary | ICD-10-CM | POA: Diagnosis not present

## 2017-02-23 DIAGNOSIS — I10 Essential (primary) hypertension: Secondary | ICD-10-CM | POA: Diagnosis not present

## 2017-02-23 DIAGNOSIS — F528 Other sexual dysfunction not due to a substance or known physiological condition: Secondary | ICD-10-CM | POA: Diagnosis not present

## 2017-02-23 DIAGNOSIS — F0280 Dementia in other diseases classified elsewhere without behavioral disturbance: Secondary | ICD-10-CM | POA: Diagnosis not present

## 2017-02-23 DIAGNOSIS — E1165 Type 2 diabetes mellitus with hyperglycemia: Secondary | ICD-10-CM | POA: Diagnosis not present

## 2017-02-23 DIAGNOSIS — Z794 Long term (current) use of insulin: Secondary | ICD-10-CM | POA: Diagnosis not present

## 2017-02-25 ENCOUNTER — Telehealth: Payer: Self-pay

## 2017-02-25 NOTE — Telephone Encounter (Signed)
Received after hours message that wife called stating that pt has been agitated and aggressive this past week. He want's to get out and do things by himself.  Triage RN suggested pt's wife to 1) hide her car keys where pt cannot find them 2) to contact our office today and 3) to call ambulance if pt's behavior becomes worse.    Have not heard from pt or wife today.

## 2017-02-26 DIAGNOSIS — G20 Parkinson's disease: Secondary | ICD-10-CM | POA: Diagnosis not present

## 2017-02-26 DIAGNOSIS — F0280 Dementia in other diseases classified elsewhere without behavioral disturbance: Secondary | ICD-10-CM | POA: Diagnosis not present

## 2017-03-01 DIAGNOSIS — G20 Parkinson's disease: Secondary | ICD-10-CM | POA: Diagnosis not present

## 2017-03-01 DIAGNOSIS — F0280 Dementia in other diseases classified elsewhere without behavioral disturbance: Secondary | ICD-10-CM | POA: Diagnosis not present

## 2017-03-03 DIAGNOSIS — F0280 Dementia in other diseases classified elsewhere without behavioral disturbance: Secondary | ICD-10-CM | POA: Diagnosis not present

## 2017-03-03 DIAGNOSIS — G20 Parkinson's disease: Secondary | ICD-10-CM | POA: Diagnosis not present

## 2017-03-04 DIAGNOSIS — G20 Parkinson's disease: Secondary | ICD-10-CM | POA: Diagnosis not present

## 2017-03-04 DIAGNOSIS — F0280 Dementia in other diseases classified elsewhere without behavioral disturbance: Secondary | ICD-10-CM | POA: Diagnosis not present

## 2017-03-08 DIAGNOSIS — M6281 Muscle weakness (generalized): Secondary | ICD-10-CM | POA: Diagnosis not present

## 2017-03-08 DIAGNOSIS — F329 Major depressive disorder, single episode, unspecified: Secondary | ICD-10-CM | POA: Diagnosis not present

## 2017-03-08 DIAGNOSIS — G20 Parkinson's disease: Secondary | ICD-10-CM | POA: Diagnosis not present

## 2017-03-08 DIAGNOSIS — F419 Anxiety disorder, unspecified: Secondary | ICD-10-CM | POA: Diagnosis not present

## 2017-03-08 DIAGNOSIS — I129 Hypertensive chronic kidney disease with stage 1 through stage 4 chronic kidney disease, or unspecified chronic kidney disease: Secondary | ICD-10-CM | POA: Diagnosis not present

## 2017-03-08 DIAGNOSIS — E1122 Type 2 diabetes mellitus with diabetic chronic kidney disease: Secondary | ICD-10-CM | POA: Diagnosis not present

## 2017-03-08 DIAGNOSIS — N183 Chronic kidney disease, stage 3 (moderate): Secondary | ICD-10-CM | POA: Diagnosis not present

## 2017-03-08 DIAGNOSIS — I6529 Occlusion and stenosis of unspecified carotid artery: Secondary | ICD-10-CM | POA: Diagnosis not present

## 2017-03-08 DIAGNOSIS — R2689 Other abnormalities of gait and mobility: Secondary | ICD-10-CM | POA: Diagnosis not present

## 2017-03-08 DIAGNOSIS — F0280 Dementia in other diseases classified elsewhere without behavioral disturbance: Secondary | ICD-10-CM | POA: Diagnosis not present

## 2017-03-15 DIAGNOSIS — R2689 Other abnormalities of gait and mobility: Secondary | ICD-10-CM | POA: Diagnosis not present

## 2017-03-15 DIAGNOSIS — M6281 Muscle weakness (generalized): Secondary | ICD-10-CM | POA: Diagnosis not present

## 2017-03-22 DIAGNOSIS — R2689 Other abnormalities of gait and mobility: Secondary | ICD-10-CM | POA: Diagnosis not present

## 2017-03-22 DIAGNOSIS — M6281 Muscle weakness (generalized): Secondary | ICD-10-CM | POA: Diagnosis not present

## 2017-03-31 DIAGNOSIS — M6281 Muscle weakness (generalized): Secondary | ICD-10-CM | POA: Diagnosis not present

## 2017-03-31 DIAGNOSIS — R2689 Other abnormalities of gait and mobility: Secondary | ICD-10-CM | POA: Diagnosis not present

## 2017-04-09 ENCOUNTER — Other Ambulatory Visit: Payer: Self-pay | Admitting: Neurology

## 2017-04-09 DIAGNOSIS — G20 Parkinson's disease: Secondary | ICD-10-CM

## 2017-04-26 ENCOUNTER — Ambulatory Visit (INDEPENDENT_AMBULATORY_CARE_PROVIDER_SITE_OTHER): Payer: Medicare Other | Admitting: Neurology

## 2017-04-26 ENCOUNTER — Encounter: Payer: Self-pay | Admitting: Neurology

## 2017-04-26 ENCOUNTER — Other Ambulatory Visit: Payer: Self-pay

## 2017-04-26 VITALS — BP 126/64 | HR 68 | Ht 73.0 in | Wt 179.0 lb

## 2017-04-26 DIAGNOSIS — F0390 Unspecified dementia without behavioral disturbance: Secondary | ICD-10-CM

## 2017-04-26 DIAGNOSIS — G20A1 Parkinson's disease without dyskinesia, without mention of fluctuations: Secondary | ICD-10-CM

## 2017-04-26 DIAGNOSIS — G20 Parkinson's disease: Secondary | ICD-10-CM | POA: Diagnosis not present

## 2017-04-26 DIAGNOSIS — F03A0 Unspecified dementia, mild, without behavioral disturbance, psychotic disturbance, mood disturbance, and anxiety: Secondary | ICD-10-CM

## 2017-04-26 MED ORDER — CARBIDOPA-LEVODOPA 25-100 MG PO TABS: 1.0000 | ORAL_TABLET | Freq: Three times a day (TID) | ORAL | 3 refills | Status: DC

## 2017-04-26 MED ORDER — RIVASTIGMINE 13.3 MG/24HR TD PT24: 13.3000 mg | MEDICATED_PATCH | Freq: Every day | TRANSDERMAL | 3 refills | Status: DC

## 2017-04-26 NOTE — Progress Notes (Signed)
NEUROLOGY FOLLOW UP OFFICE NOTE  Evan Vaughan [id number] [birth year]  HISTORY OF PRESENT ILLNESS: I had the pleasure of seeing Evan Vaughan in follow-up in the neurology clinic on 04/26/2017.  The patient was last seen 5 months ago for Parkinson's disease. He is again accompanied by his wife who helps supplement the history today.  On his initial visit, he was noted to have mild dementia with MMSE of 20/30. He was noted to have a tremor on the right hand with ambulation, postural instability, and bradykinesia, concerning for Parkinson's disease. He also had brisk reflexes and upgoing toe on the left. Prior brain imaging showed old strokes on the right side, however MRI cervical and lumbar spine were ordered to assess for underlying abnormality. Records and images were personally reviewed where available. 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 There were multilevel degenerative cervical spinal stenosis with up to moderate spinal cord mass effect at C3-4 through C6-7, no myelomalacia seen. There was widespread moderate and severe multifactorial cervical neural foraminal stenosis, sparing only the right C3 nerve level. MRI lumbar spine showed advanced degeneration at L4-5 progressed since 2012 with moderate to severe spinal and right lateral recess stenosis, moderate to severe right L4 foraminal stenosis, progressed lumbar disc degeneration at L2-3, L3-4, stable chronic degeneration at L5-S1. Findings were discussed with his wife on the phone and he was referred for PT for gait instability. They were unable to do this then he was admitted at [facility] last 02/09/17 for confusion and right-sided weakness. Symptoms mostly resolved on arrival to the ER. He had a fever of 102 and was started on antibiotic. He had an MRI brain without contrast which I personally reviewed, no acute changes seen. It was unclear if episode was a TIA, it was  recommended to continue aspirin and aggressive risk factor modification. His glucose was elevated, HbA1c was 8.4. He did PT with [place] after the stoke. His wife reports he did not qualify with [facility] after.   He feels his memory is pretty good. He states he does not do a whole lot of driving. His wife reminded him he did not renew his license in November and has not been driving. She administers his medications, she sometimes forgets to give them. She is in charge of finances. He denies any falls, but his wife reminds him he fell 3 weeks ago under their carport. He has not had any falls since then. He denies any neck pain, no focal numbness/tingling/weakness. Sleep is good, he does not wander. Appetite is good. No personality changes. He was started on Sinemet on his last visit, his wife feels it helps, he takes 1 tab TID with no side effects. He is on the rivastigmine patch with no side effects.  HPI 11/18/2016: This is an 83 yo RH man with a history of diabetes, hypertension, prior stroke, dementia, who presented for evaluation of gait and memory issues. His wife provides majority of history. He feels his memory and walking are pretty good. He states he used to walk a lot more but just gets slack on it, and denies any weaknes in his legs. He feels his memory is pretty good, he denies getting lost driving, but his wife reports she does most of the driving. His wife is in charge of medications and finances. He lives with his wife and grandson, he is independent with dressing and bathing. His wife started  noticing memory changes around 81 years ago, he was previously prescribed Aricept and Namenda, which caused side effects. He has been on the Exelon patch for the past 6-12 months, which has helped quite a bit. Dose was increased because of mood issues ("he was angry"), which helped. They deny hallucinations, but he has a lot of "crazy dreams." No REM behavior disorder, but he talks a lot and is restless  in his sleep. His wife started noticing gait changes around 81-2 years ago, she reports he drags his feet, shuffling and sliding feet. He has fallen several times, one time 81-4 months ago he fell against the commode and knocked off the screws. His last fall was 81 this week, he got up at night and fell. His wife reports his legs are weak, he used to walk 1 mile three times a week until a year ago. Now after walking 20 minutes, he falls into his chair and is worn out. He has had a tremor for the past few months. He denies any headaches, dizziness, diplopia, dysarthria/dysphagia, neck pain, bowel/bladder dysfunction, focal numbness/tingling, anosmia. He has chronic back pain.   Records from his prior neurologist at [facility] were reviewed. He was initially seen in July 2017, previous to this he was followed at [facility]. 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 He started repeating himself. He had an MRI brain around 81/2016 which per documentation showed a progressive increase in biventricular diameter since 2007 without accompanying significant cortical atrophy. There was also a remote right occipital infarct with slight volume loss and gliosis. In 10/2014, he had an LP which was originally intended to be high volume to assess for NPH, but per documentation was only able to obtain 1mL of CSF (uncertain why) with an opening pressure of 13.5 cm H2O, reported to have elevated protein. He worked until June 2017.  He was noted to have a mild right>left UE tremor. He had a repeat MRI brain 08/2015 which did not show any acute changes. THere was scattered bilateral patchy areas of periventricular and subcortical white matter signal, likely representing chronic microvascular changes. This is associated with mils cerebral volume atrophy and ex vacuo dilation of the ventricles. There were small remote infarcts in the right posterior  frontal lobe and right occipital lobe. Family reported worsening balance on his last visit in November 2017. They reported several falls. His prior neurologist tried to taper off Xanax, but family restarted it due to increased agitation primarily at night.  Laboratory Data: 09/2014: TSH and B12, folate normal. RPR nonreactive  PAST MEDICAL HISTORY: Past Medical History:  Diagnosis Date  . Bruises easily   . Diabetes mellitus without complication [facility])     MEDICATIONS: Current Outpatient Medications on File Prior to Visit  Medication Sig Dispense Refill  . ALPRAZolam (XANAX) 0.25 MG tablet Take 0.25 mg by mouth at bedtime.  0  . aspirin EC 81 MG tablet Take 81 mg by mouth daily.    . carbidopa-levodopa (SINEMET IR) 25-100 MG tablet Take 1 tablet by mouth 3 (three) times daily.    . colesevelam (WELCHOL) 625 MG tablet Take 1,875 mg by mouth 2 (two) times daily with a meal. Take one (1) tablet (625 mg) each morning and two (2) tablets (1250 mg) each evening.    . DUPIXENT 300 MG/2ML SOSY Take one (1) inject as directed every two (2) weeks.    Marland Kitchen ezetimibe (ZETIA) 10  MG tablet Take 10 mg by mouth at bedtime.    Marland Kitchen FLUoxetine (PROZAC) 20 MG capsule Take 60 mg by mouth at bedtime. (Take with 40 mg = 60 mg total)  4  . FLUoxetine (PROZAC) 40 MG capsule Take 40 mg by mouth at bedtime. (Take with 20 mg = 60 mg total)  1  . hydrOXYzine (ATARAX/VISTARIL) 50 MG tablet Take 50 mg by mouth at bedtime as needed for itching.    Marland Kitchen LANTUS SOLOSTAR 100 UNIT/ML Solostar Pen Inject 25 Units into the skin 2 (two) times daily.  5  . omeprazole (PRILOSEC) 20 MG capsule Take 20 mg by mouth daily.     . pioglitazone (ACTOS) 30 MG tablet Take 30 mg by mouth every morning.     . rivastigmine (EXELON) 13.3 MG/24HR Place 13.3 mg onto the skin daily.  0  . tamsulosin (FLOMAX) 0.4 MG CAPS capsule Take 0.4 mg by mouth daily.  4   No current facility-administered medications on file prior to visit.     ALLERGIES: No  Known Allergies  FAMILY HISTORY: Family History  Problem Relation Age of Onset  . Heart disease Mother   . Hypertension Father   . Heart attack Father   . Healthy Son   . Diabetes Other        sibling  . Heart Problems Other        sibling    SOCIAL HISTORY: Social History   Socioeconomic History  . Marital status: Married    Spouse name: Not on file  . Number of children: Not on file  . Years of education: Not on file  . Highest education level: Not on file  Occupational History  . Not on file  Social Needs  . Financial resource strain: Not on file  . Food insecurity:    Worry: Not on file    Inability: Not on file  . Transportation needs:    Medical: Not on file    Non-medical: Not on file  Tobacco Use  . Smoking status: Never Smoker  . Smokeless tobacco: Never Used  Substance and Sexual Activity  . Alcohol use: No  . Drug use: No  . Sexual activity: Not on file  Lifestyle  . Physical activity:    Days per week: Not on file    Minutes per session: Not on file  . Stress: Not on file  Relationships  . Social connections:    Talks on phone: Not on file    Gets together: Not on file    Attends religious service: Not on file    Active member of club or organization: Not on file    Attends meetings of clubs or organizations: Not on file    Relationship status: Not on file  . Intimate partner violence:    Fear of current or ex partner: Not on file    Emotionally abused: Not on file    Physically abused: Not on file    Forced sexual activity: Not on file  Other Topics Concern  . Not on file  Social History Narrative   Pt lives in 1 story home with his wife and her grandson   Has 1 biological child   Highest level of education: high school diploma   Retired Art gallery manager    REVIEW OF SYSTEMS: Constitutional: No fevers, chills, or sweats, no generalized fatigue, change in appetite Eyes: No visual changes, double vision, eye pain Ear, nose and throat: No hearing  loss, ear pain, nasal  congestion, sore throat Cardiovascular: No chest pain, palpitations Respiratory:  No shortness of breath at rest or with exertion, wheezes GastrointestinaI: No nausea, vomiting, diarrhea, abdominal pain, fecal incontinence Genitourinary:  No dysuria, urinary retention or frequency Musculoskeletal:  No neck pain, back pain Integumentary: No rash, pruritus, skin lesions Neurological: as above Psychiatric: No depression, insomnia, anxiety Endocrine: No palpitations, fatigue, diaphoresis, mood swings, change in appetite, change in weight, increased thirst Hematologic/Lymphatic:  No anemia, purpura, petechiae. Allergic/Immunologic: no itchy/runny eyes, nasal congestion, recent allergic reactions, rashes  PHYSICAL EXAM: Vitals:   04/26/17 1307  BP: 126/64  Pulse: 68  SpO2: 96%   General: No acute distress Head:  Normocephalic/atraumatic Neck: supple, no paraspinal tenderness, full range of motion Heart:  Regular rate and rhythm Lungs:  Clear to auscultation bilaterally Back: No paraspinal tenderness Skin/Extremities: No rash, no edema Neurological Exam: alert and oriented to person, place, and year. No aphasia or dysarthria. Fund of knowledge is appropriate.  Recent and remote memory are impaired.  Attention and concentration are normal.    Able to name objects and repeat phrases. CDT 4/5 MMSE - Mini Mental State Exam 04/26/2017 11/18/2016  Orientation to time 1 3  Orientation to Place 4 5  Registration 3 3  Attention/ Calculation 5 1  Recall 0 0  Language- name 2 objects 2 2  Language- repeat 1 0  Language- follow 3 step command 3 3  Language- read & follow direction 1 1  Write a sentence 1 1  Copy design 0 1  Total score 21 20    Cranial nerves: Pupils equal, round, reactive to light. Extraocular movements intact with no nystagmus. Visual fields full. Facial sensation intact. No facial asymmetry. Tongue, uvula, palate midline.  Motor: +bilateral cogwheeling,  muscle strength 5/5 throughout with no pronator drift.  Sensation to light touch intact.  No extinction to double simultaneous stimulation.  Deep tendon reflexes brisk +3 on left UE and LE, _2 right UE, patella, +1 bilateral ankle jerks. Finger to nose testing intact.  Gait slightly wide-based with right hand tremor on ambulation (similar to prior). +head tremor, no resting tremor, +postural tremor R>L; +postural instability, +bradykinesia with decreased finger taps bilaterally, fair toe tapping  IMPRESSION: This is an 83 yo RH right-handed man with a history of diabetes, hypertension, prior stroke, dementia, who presented for gait and memory issues. MMSE today 21/30 (20/30 in October 2018). Gait issues are likely multifactorial from spinal stenosis and Parkinson's disease, we discussed increasing Sinemet 25/100mg , his wife states she forgets the medication sometimes and wants to try giving it regularly TID with meals first. Continue Exelon patch. He will be referred for gait instability to [facility]. His wife reports he was there after the TIA but not for gait. He was admitted for transient confusion and right-sided weakness last January 2019, MRI brain negative for stroke, he had a febrile illness at that time. Continue daily aspirin and control of vascular risk factors. His last HbA1c was elevated, continue glucose and lipid control with PCP. They know to go to the ER immediately for any sudden change in symptoms. Follow-up in 6 months, she knows to call for any changes.   Thank you for allowing me to participate in his care.  Please do not hesitate to call for any questions or concerns.  The duration of this appointment visit was 25 minutes of face-to-face time with the patient.  Greater than 50% of this time was spent in counseling, explanation of diagnosis, planning of  further management, and coordination of care.   Evan Vaughan, M.D.   CC: Dr. Laqueta Due

## 2017-04-26 NOTE — Patient Instructions (Signed)
1. Refer to PT for gait instability/Parkinson's disease at [facility] 2. Continue Sinemet 25/100mg  three times a day with meals 3. Continue Exelon patch daily 4. Follow-up in 6 months, call for any changes  FALL PRECAUTIONS: Be cautious when walking. Scan the area for obstacles that may increase the risk of trips and falls. When getting up in the mornings, sit up at the edge of the bed for a few minutes before getting out of bed. Consider elevating the bed at the head end to avoid drop of blood pressure when getting up. Walk always in a well-lit room (use night lights in the walls). Avoid area rugs or power cords from appliances in the middle of the walkways. Use a walker or a cane if necessary and consider physical therapy for balance exercise. Get your eyesight checked regularly.  FINANCIAL OVERSIGHT: Supervision, especially oversight when making financial decisions or transactions is also recommended.  HOME SAFETY: Consider the safety of the kitchen when operating appliances like stoves, microwave oven, and blender. Consider having supervision and share cooking responsibilities until no longer able to participate in those. Accidents with firearms and other hazards in the house should be identified and addressed as well.  DRIVING: Regarding driving, in patients with progressive memory problems, driving will be impaired. We advise to have someone else do the driving if trouble finding directions or if minor accidents are reported. Independent driving assessment is available to determine safety of driving.  ABILITY TO BE LEFT ALONE: If patient is unable to contact 911 operator, consider using LifeLine, or when the need is there, arrange for someone to stay with patients. Smoking is a fire hazard, consider supervision or cessation. Risk of wandering should be assessed by caregiver and if detected at any point, supervision and safe proof recommendations should be instituted.  MEDICATION SUPERVISION:  Inability to self-administer medication needs to be constantly addressed. Implement a mechanism to ensure safe administration of the medications.  RECOMMENDATIONS FOR ALL PATIENTS WITH MEMORY PROBLEMS: 1. Continue to exercise (Recommend 30 minutes of walking everyday, or 3 hours every week) 2. Increase social interactions - continue going to [place] and enjoy social gatherings with friends and family 3. Eat healthy, avoid fried foods and eat more fruits and vegetables 4. Maintain adequate blood pressure, blood sugar, and blood cholesterol level. Reducing the risk of stroke and cardiovascular disease also helps promoting better memory. 5. Avoid stressful situations. Live a simple life and avoid aggravations. Organize your time and prepare for the next day in anticipation. 6. Sleep well, avoid any interruptions of sleep and avoid any distractions in the bedroom that may interfere with adequate sleep quality 7. Avoid sugar, avoid sweets as there is a strong link between excessive sugar intake, diabetes, and cognitive impairment We discussed the Mediterranean diet, which has been shown to help patients reduce the risk of progressive memory disorders and reduces cardiovascular risk. This includes eating fish, eat fruits and green leafy vegetables, nuts like almonds and hazelnuts, walnuts, and also use olive oil. Avoid fast foods and fried foods as much as possible. Avoid sweets and sugar as sugar use has been linked to worsening of memory function.  There is always a concern of gradual progression of memory problems. If this is the case, then we may need to adjust level of care according to patient needs. Support, both to the patient and caregiver, should then be put into place.

## 2017-05-17 DIAGNOSIS — R2689 Other abnormalities of gait and mobility: Secondary | ICD-10-CM | POA: Diagnosis not present

## 2017-05-17 DIAGNOSIS — R2681 Unsteadiness on feet: Secondary | ICD-10-CM | POA: Diagnosis not present

## 2017-05-24 DIAGNOSIS — R2681 Unsteadiness on feet: Secondary | ICD-10-CM | POA: Diagnosis not present

## 2017-05-24 DIAGNOSIS — R2689 Other abnormalities of gait and mobility: Secondary | ICD-10-CM | POA: Diagnosis not present

## 2017-05-31 DIAGNOSIS — R2689 Other abnormalities of gait and mobility: Secondary | ICD-10-CM | POA: Diagnosis not present

## 2017-05-31 DIAGNOSIS — R2681 Unsteadiness on feet: Secondary | ICD-10-CM | POA: Diagnosis not present

## 2017-06-14 DIAGNOSIS — R2681 Unsteadiness on feet: Secondary | ICD-10-CM | POA: Diagnosis not present

## 2017-06-14 DIAGNOSIS — R2689 Other abnormalities of gait and mobility: Secondary | ICD-10-CM | POA: Diagnosis not present

## 2017-06-22 DIAGNOSIS — R2689 Other abnormalities of gait and mobility: Secondary | ICD-10-CM | POA: Diagnosis not present

## 2017-06-22 DIAGNOSIS — R2681 Unsteadiness on feet: Secondary | ICD-10-CM | POA: Diagnosis not present

## 2017-09-06 DIAGNOSIS — E1165 Type 2 diabetes mellitus with hyperglycemia: Secondary | ICD-10-CM | POA: Diagnosis not present

## 2017-09-06 DIAGNOSIS — E785 Hyperlipidemia, unspecified: Secondary | ICD-10-CM | POA: Diagnosis not present

## 2017-09-06 DIAGNOSIS — R1110 Vomiting, unspecified: Secondary | ICD-10-CM | POA: Diagnosis not present

## 2017-09-06 DIAGNOSIS — G20 Parkinson's disease: Secondary | ICD-10-CM | POA: Diagnosis not present

## 2017-09-06 DIAGNOSIS — I10 Essential (primary) hypertension: Secondary | ICD-10-CM | POA: Diagnosis not present

## 2017-09-09 DIAGNOSIS — I10 Essential (primary) hypertension: Secondary | ICD-10-CM | POA: Diagnosis not present

## 2017-09-09 DIAGNOSIS — E1165 Type 2 diabetes mellitus with hyperglycemia: Secondary | ICD-10-CM | POA: Diagnosis not present

## 2017-09-09 DIAGNOSIS — E785 Hyperlipidemia, unspecified: Secondary | ICD-10-CM | POA: Diagnosis not present

## 2017-09-09 DIAGNOSIS — Z79899 Other long term (current) drug therapy: Secondary | ICD-10-CM | POA: Diagnosis not present

## 2017-10-05 DIAGNOSIS — R112 Nausea with vomiting, unspecified: Secondary | ICD-10-CM | POA: Diagnosis not present

## 2017-10-29 ENCOUNTER — Encounter: Payer: Self-pay | Admitting: Neurology

## 2017-10-29 ENCOUNTER — Other Ambulatory Visit: Payer: Self-pay

## 2017-10-29 ENCOUNTER — Ambulatory Visit (INDEPENDENT_AMBULATORY_CARE_PROVIDER_SITE_OTHER): Payer: Medicare Other | Admitting: Neurology

## 2017-10-29 VITALS — BP 122/68 | HR 62 | Ht 73.0 in | Wt 179.0 lb

## 2017-10-29 DIAGNOSIS — F0390 Unspecified dementia without behavioral disturbance: Secondary | ICD-10-CM

## 2017-10-29 DIAGNOSIS — G20 Parkinson's disease: Secondary | ICD-10-CM | POA: Diagnosis not present

## 2017-10-29 DIAGNOSIS — F03A0 Unspecified dementia, mild, without behavioral disturbance, psychotic disturbance, mood disturbance, and anxiety: Secondary | ICD-10-CM

## 2017-10-29 MED ORDER — CARBIDOPA-LEVODOPA 25-100 MG PO TABS: 1.5000 | ORAL_TABLET | ORAL | 3 refills | Status: DC

## 2017-10-29 MED ORDER — RIVASTIGMINE 13.3 MG/24HR TD PT24: 13.3000 mg | MEDICATED_PATCH | Freq: Every day | TRANSDERMAL | 3 refills | Status: DC

## 2017-10-29 NOTE — Patient Instructions (Signed)
1. Lets switch the Sinemet 25/100mg : take 1.5 tablets before breakfast and 1.5 tablets before dinner 2. Continue Rivastigmine 13.3mg  patch daily 3. Refer to [place] for help with medication management 4. Continue to monitor mood and discuss medication adjustments with Dr. Laqueta Due 5. Psychologist, sport and exercise to ask for options and resources. Would start looking into getting more help at home or assisted living options 6. Follow-up in 6 months, call for any changes  FALL PRECAUTIONS: Be cautious when walking. Scan the area for obstacles that may increase the risk of trips and falls. When getting up in the mornings, sit up at the edge of the bed for a few minutes before getting out of bed. Consider elevating the bed at the head end to avoid drop of blood pressure when getting up. Walk always in a well-lit room (use night lights in the walls). Avoid area rugs or power cords from appliances in the middle of the walkways. Use a walker or a cane if necessary and consider physical therapy for balance exercise. Get your eyesight checked regularly.  HOME SAFETY: Consider the safety of the kitchen when operating appliances like stoves, microwave oven, and blender. Consider having supervision and share cooking responsibilities until no longer able to participate in those. Accidents with firearms and other hazards in the house should be identified and addressed as well.  ABILITY TO BE LEFT ALONE: If patient is unable to contact 911 operator, consider using LifeLine, or when the need is there, arrange for someone to stay with patients. Smoking is a fire hazard, consider supervision or cessation. Risk of wandering should be assessed by caregiver and if detected at any point, supervision and safe proof recommendations should be instituted.  RECOMMENDATIONS FOR ALL PATIENTS WITH MEMORY PROBLEMS: 1. Continue to exercise (Recommend 30 minutes of walking everyday, or 3 hours every week) 2. Increase social interactions -  continue going to [place] and enjoy social gatherings with friends and family 3. Eat healthy, avoid fried foods and eat more fruits and vegetables 4. Maintain adequate blood pressure, blood sugar, and blood cholesterol level. Reducing the risk of stroke and cardiovascular disease also helps promoting better memory. 5. Avoid stressful situations. Live a simple life and avoid aggravations. Organize your time and prepare for the next day in anticipation. 6. Sleep well, avoid any interruptions of sleep and avoid any distractions in the bedroom that may interfere with adequate sleep quality 7. Avoid sugar, avoid sweets as there is a strong link between excessive sugar intake, diabetes, and cognitive impairment The Mediterranean diet has been shown to help patients reduce the risk of progressive memory disorders and reduces cardiovascular risk. This includes eating fish, eat fruits and green leafy vegetables, nuts like almonds and hazelnuts, walnuts, and also use olive oil. Avoid fast foods and fried foods as much as possible. Avoid sweets and sugar as sugar use has been linked to worsening of memory function.  There is always a concern of gradual progression of memory problems. If this is the case, then we may need to adjust level of care according to patient needs. Support, both to the patient and caregiver, should then be put into place.

## 2017-10-29 NOTE — Progress Notes (Signed)
NEUROLOGY FOLLOW UP OFFICE NOTE  Evan Vaughan [id number] 04/29/1910  HISTORY OF PRESENT ILLNESS: I had the pleasure of seeing Evan Vaughan in follow-up in the neurology clinic on 10/29/2017.  The patient was last seen 6 months ago for Parkinson's disease and dementia. He is again accompanied by his wife who helps supplement the history today.  MMSE in March 2019 was 21/30 (20/30 in October 2018). He was noted to have a tremor on the right hand with ambulation, postural instability, and bradykinesia, concerning for Parkinson's disease. He also had brisk reflexes and upgoing toe on the left. Prior brain imaging showed old strokes on the right side, however MRI cervical and lumbar spine were ordered to assess for underlying abnormality. Since his last visit, he feels he his doing fine. His wife reports worsening memory but cannot give specific examples. His wife manages medications but states that she herself sometimes forgets. She manages finances. He does not drive. His wife reports his mood is "bad," Prozac was increased 3-4 months ago by Dr. Laqueta Due to 60mg  daily. He is independent with dressing and bathing but sometimes has trouble with buttons. His wife notes improvement in tremor when taking Sinemet, but has difficulty giving TID due to their eating schedule. He mostly has the right hand tremor, it does not affect using a fork, some days are worse than others. He sleeps well at night but is also sleepy during the day. He may have kicked his wife in his sleep, he dreams a lot. He had a fall last month on uneven ground filling the bird feeder, no injuries. He is on Rivastigmine patch with no side effects. His wife denies any paranoia or hallucinations. He denies any headaches, dizziness, focal numbness/tingling/weakness.   HPI 11/18/2016: This is an 83 yo RH man with a history of diabetes, hypertension, prior stroke, dementia, who presented for evaluation of gait and memory issues. His wife  provides majority of history. He feels his memory and walking are pretty good. He states he used to walk a lot more but just gets slack on it, and denies any weaknes in his legs. He feels his memory is pretty good, he denies getting lost driving, but his wife reports she does most of the driving. His wife is in charge of medications and finances. He lives with his wife and grandson, he is independent with dressing and bathing. His wife started noticing memory changes around 1.5 years ago, he was previously prescribed Aricept and Namenda, which caused side effects. He has been on the Exelon patch for the past 6-12 months, which has helped quite a bit. Dose was increased because of mood issues ("he was angry"), which helped. They deny hallucinations, but he has a lot of "crazy dreams." No REM behavior disorder, but he talks a lot and is restless in his sleep. His wife started noticing gait changes around 1-2 years ago, she reports he drags his feet, shuffling and sliding feet. He has fallen several times, one time 3-4 months ago he fell against the commode and knocked off the screws. His last fall was just this week, he got up at night and fell. His wife reports his legs are weak, he used to walk 1 mile three times a week until a year ago. Now after walking 20 minutes, he falls into his chair and is worn out. He has had a tremor for the past few months. He denies any headaches, dizziness, diplopia, dysarthria/dysphagia, neck pain, bowel/bladder dysfunction, focal numbness/tingling, anosmia.  He has chronic back pain.   Records from his prior neurologist at [facility] were reviewed. He was initially seen in July 2017, previous to this he was followed at [facility]. 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 He started repeating himself. He had an MRI brain around 09/2014 which per documentation showed a progressive increase in  biventricular diameter since 2007 without accompanying significant cortical atrophy. There was also a remote right occipital infarct with slight volume loss and gliosis. In 10/2014, he had an LP which was originally intended to be high volume to assess for NPH, but per documentation was only able to obtain 15mL of CSF (uncertain why) with an opening pressure of 13.5 cm H2O, reported to have elevated protein. He worked until June 2017.  He was noted to have a mild right>left UE tremor. He had a repeat MRI brain 08/2015 which did not show any acute changes. THere was scattered bilateral patchy areas of periventricular and subcortical white matter signal, likely representing chronic microvascular changes. This is associated with mils cerebral volume atrophy and ex vacuo dilation of the ventricles. There were small remote infarcts in the right posterior frontal lobe and right occipital lobe. Family reported worsening balance on his last visit in November 2017. They reported several falls. His prior neurologist tried to taper off Xanax, but family restarted it due to increased agitation primarily at night.  Laboratory Data: 09/2014: TSH and B12, folate normal. RPR nonreactive  Records and images were personally reviewed where available. 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 There were multilevel degenerative cervical spinal stenosis with up to moderate spinal cord mass effect at C3-4 through C6-7, no myelomalacia seen. There was widespread moderate and severe multifactorial cervical neural foraminal stenosis, sparing only the right C3 nerve level. MRI lumbar spine showed advanced degeneration at L4-5 progressed since 2012 with moderate to severe spinal and right lateral recess stenosis, moderate to severe right L4 foraminal stenosis, progressed lumbar disc degeneration at L2-3, L3-4, stable chronic degeneration at L5-S1. Findings were  discussed with his wife on the phone and he was referred for PT for gait instability. They were unable to do this then he was admitted at [facility] last 02/09/17 for confusion and right-sided weakness. Symptoms mostly resolved on arrival to the ER. He had a fever of 102 and was started on antibiotic. He had an MRI brain without contrast which I personally reviewed, no acute changes seen. It was unclear if episode was a TIA, it was recommended to continue aspirin and aggressive risk factor modification. His glucose was elevated, HbA1c was 8.4. He did PT with [facility] after the stoke. His wife reports he did not qualify with [facility] after.   PAST MEDICAL HISTORY: Past Medical History:  Diagnosis Date  . Bruises easily   . Diabetes mellitus without complication [facility])     MEDICATIONS: Current Outpatient Medications on File Prior to Visit  Medication Sig Dispense Refill  . ALPRAZolam (XANAX) 0.25 MG tablet Take 0.25 mg by mouth at bedtime.  0  . aspirin EC 81 MG tablet Take 81 mg by mouth daily.    . carbidopa-levodopa (SINEMET IR) 25-100 MG tablet Take 1 tablet by mouth 3 (three) times daily. 270 tablet 3  . colesevelam (WELCHOL) 625 MG tablet Take 1,875 mg by mouth 2 (two) times daily with a meal.  Take one (1) tablet (625 mg) each morning and two (2) tablets (1250 mg) each evening.    . DUPIXENT 300 MG/2ML SOSY Take one (1) inject as directed every two (2) weeks.    Marland Kitchen ezetimibe (ZETIA) 10 MG tablet Take 10 mg by mouth at bedtime.    Marland Kitchen FLUoxetine (PROZAC) 20 MG capsule Take 60 mg by mouth at bedtime. (Take with 40 mg = 60 mg total)  4  . FLUoxetine (PROZAC) 40 MG capsule Take 40 mg by mouth at bedtime. (Take with 20 mg = 60 mg total)  1  . hydrOXYzine (ATARAX/VISTARIL) 50 MG tablet Take 50 mg by mouth at bedtime as needed for itching.    Marland Kitchen LANTUS SOLOSTAR 100 UNIT/ML Solostar Pen Inject 25 Units into the skin 2 (two) times daily.  5  . omeprazole (PRILOSEC) 20 MG capsule Take 20 mg by mouth daily.       . pioglitazone (ACTOS) 30 MG tablet Take 30 mg by mouth every morning.     . rivastigmine (EXELON) 13.3 MG/24HR Place 1 patch (13.3 mg total) onto the skin daily. 90 patch 3  . tamsulosin (FLOMAX) 0.4 MG CAPS capsule Take 0.4 mg by mouth daily.  4   No current facility-administered medications on file prior to visit.     ALLERGIES: No Known Allergies  FAMILY HISTORY: Family History  Problem Relation Age of Onset  . Heart disease Mother   . Hypertension Father   . Heart attack Father   . Healthy Son   . Diabetes Other        sibling  . Heart Problems Other        sibling    SOCIAL HISTORY: Social History   Socioeconomic History  . Marital status: Married    Spouse name: Not on file  . Number of children: Not on file  . Years of education: Not on file  . Highest education level: Not on file  Occupational History  . Not on file  Social Needs  . Financial resource strain: Not on file  . Food insecurity:    Worry: Not on file    Inability: Not on file  . Transportation needs:    Medical: Not on file    Non-medical: Not on file  Tobacco Use  . Smoking status: Never Smoker  . Smokeless tobacco: Never Used  Substance and Sexual Activity  . Alcohol use: No  . Drug use: No  . Sexual activity: Not on file  Lifestyle  . Physical activity:    Days per week: Not on file    Minutes per session: Not on file  . Stress: Not on file  Relationships  . Social connections:    Talks on phone: Not on file    Gets together: Not on file    Attends religious service: Not on file    Active member of club or organization: Not on file    Attends meetings of clubs or organizations: Not on file    Relationship status: Not on file  . Intimate partner violence:    Fear of current or ex partner: Not on file    Emotionally abused: Not on file    Physically abused: Not on file    Forced sexual activity: Not on file  Other Topics Concern  . Not on file  Social History Narrative   Pt  lives in 1 story home with his wife and her grandson   Has 1 biological child   Highest  level of education: high school diploma   Retired Art gallery manager    REVIEW OF SYSTEMS: Constitutional: No fevers, chills, or sweats, no generalized fatigue, change in appetite Eyes: No visual changes, double vision, eye pain Ear, nose and throat: No hearing loss, ear pain, nasal congestion, sore throat Cardiovascular: No chest pain, palpitations Respiratory:  No shortness of breath at rest or with exertion, wheezes GastrointestinaI: No nausea, vomiting, diarrhea, abdominal pain, fecal incontinence Genitourinary:  No dysuria, urinary retention or frequency Musculoskeletal:  No neck pain, back pain Integumentary: No rash, pruritus, skin lesions Neurological: as above Psychiatric: No depression, insomnia, anxiety Endocrine: No palpitations, fatigue, diaphoresis, mood swings, change in appetite, change in weight, increased thirst Hematologic/Lymphatic:  No anemia, purpura, petechiae. Allergic/Immunologic: no itchy/runny eyes, nasal congestion, recent allergic reactions, rashes  PHYSICAL EXAM: Vitals:   10/29/17 1133  BP: 122/68  Pulse: 62  SpO2: 99%   General: No acute distress Head:  Normocephalic/atraumatic Neck: supple, no paraspinal tenderness, full range of motion Heart:  Regular rate and rhythm Lungs:  Clear to auscultation bilaterally Back: No paraspinal tenderness Skin/Extremities: No rash, no edema Neurological Exam: alert and oriented to person, place, states it is Tuesday, year 1918 (it is Friday, 2019). No aphasia or dysarthria. Fund of knowledge is reduced.  Recent and remote memory are impaired. 0/3 delayed recall.  Attention and concentration are reduced, 2/5 spelling WORLD backward.   Able to name objects and repeat phrases. Cranial nerves: Pupils round, reactive to light. Extraocular movements intact with no nystagmus. Visual fields full. Facial sensation intact. Shallow left nasolabial  fold. Tongue, uvula, palate midline.  Motor: no significant cogwheeling today, muscle strength 5/5 throughout with no pronator drift. Good finger and toe taps today.  Sensation to light touch intact.  No extinction to double simultaneous stimulation.  Deep tendon reflexes brisk +3 on left UE and LE, _2 right UE, patella, +1 bilateral ankle jerks. Finger to nose testing intact.  Gait improved today, narrow-based with good strides, right hand tremor again noted on ambulation. No head tremor today. Mild right hand resting tremor>postural tremor R>L, no endpoint tremor. +pull test, able to rise with arms crossed over chest  IMPRESSION: This is an 83 yo RH right-handed man with a history of diabetes, hypertension, prior stroke, dementia, who presented for gait and memory issues. MMSE in March 2019 was 21/30 (20/30 in October 2018). Gait issues are likely multifactorial from spinal stenosis and Parkinson's disease, his wife has difficulties giving TID dosing, we will try Sinemet 25/100mg  1.5 tabs with breakfast and dinner. Continue Rivastigmine patch. He will be referred to [facility] for medication management. Continue daily aspirin and control of vascular risk factors. Continue 24/7 care, his wife was encouraged to start getting more help at home or looking into higher level of care. Resources given . He does not drive. Continue to monitor mood and discuss medication adjustment with Dr. Laqueta Due. Follow-up in 6 months, she knows to call for any changes.   Thank you for allowing me to participate in his care.  Please do not hesitate to call for any questions or concerns.  The duration of this appointment visit was 30 minutes of face-to-face time with the patient.  Greater than 50% of this time was spent in counseling, explanation of diagnosis, planning of further management, and coordination of care.   Ellouise Newer, M.D.   CC: Dr. Laqueta Due

## 2017-11-15 DIAGNOSIS — Z6824 Body mass index (BMI) 24.0-24.9, adult: Secondary | ICD-10-CM | POA: Diagnosis not present

## 2017-11-15 DIAGNOSIS — R5383 Other fatigue: Secondary | ICD-10-CM | POA: Diagnosis not present

## 2017-11-15 DIAGNOSIS — Z1331 Encounter for screening for depression: Secondary | ICD-10-CM | POA: Diagnosis not present

## 2017-11-15 DIAGNOSIS — Z0001 Encounter for general adult medical examination with abnormal findings: Secondary | ICD-10-CM | POA: Diagnosis not present

## 2017-11-15 DIAGNOSIS — Z1339 Encounter for screening examination for other mental health and behavioral disorders: Secondary | ICD-10-CM | POA: Diagnosis not present

## 2017-11-15 DIAGNOSIS — E1165 Type 2 diabetes mellitus with hyperglycemia: Secondary | ICD-10-CM | POA: Diagnosis not present

## 2017-11-15 DIAGNOSIS — F419 Anxiety disorder, unspecified: Secondary | ICD-10-CM | POA: Diagnosis not present

## 2018-01-18 DIAGNOSIS — G309 Alzheimer's disease, unspecified: Secondary | ICD-10-CM | POA: Diagnosis not present

## 2018-01-18 DIAGNOSIS — M6281 Muscle weakness (generalized): Secondary | ICD-10-CM | POA: Diagnosis not present

## 2018-01-18 DIAGNOSIS — Z6824 Body mass index (BMI) 24.0-24.9, adult: Secondary | ICD-10-CM | POA: Diagnosis not present

## 2018-01-18 DIAGNOSIS — F419 Anxiety disorder, unspecified: Secondary | ICD-10-CM | POA: Diagnosis not present

## 2018-01-18 DIAGNOSIS — E785 Hyperlipidemia, unspecified: Secondary | ICD-10-CM | POA: Diagnosis not present

## 2018-01-18 DIAGNOSIS — M549 Dorsalgia, unspecified: Secondary | ICD-10-CM | POA: Diagnosis not present

## 2018-01-18 DIAGNOSIS — G20 Parkinson's disease: Secondary | ICD-10-CM | POA: Diagnosis not present

## 2018-01-18 DIAGNOSIS — Z79899 Other long term (current) drug therapy: Secondary | ICD-10-CM | POA: Diagnosis not present

## 2018-01-18 DIAGNOSIS — E1165 Type 2 diabetes mellitus with hyperglycemia: Secondary | ICD-10-CM | POA: Diagnosis not present

## 2018-02-01 DIAGNOSIS — L570 Actinic keratosis: Secondary | ICD-10-CM | POA: Diagnosis not present

## 2018-02-01 DIAGNOSIS — C4442 Squamous cell carcinoma of skin of scalp and neck: Secondary | ICD-10-CM | POA: Diagnosis not present

## 2018-02-01 DIAGNOSIS — D2239 Melanocytic nevi of other parts of face: Secondary | ICD-10-CM | POA: Diagnosis not present

## 2018-02-10 DIAGNOSIS — I5030 Unspecified diastolic (congestive) heart failure: Secondary | ICD-10-CM | POA: Diagnosis not present

## 2018-02-10 DIAGNOSIS — F419 Anxiety disorder, unspecified: Secondary | ICD-10-CM | POA: Diagnosis not present

## 2018-02-10 DIAGNOSIS — E785 Hyperlipidemia, unspecified: Secondary | ICD-10-CM | POA: Diagnosis not present

## 2018-02-10 DIAGNOSIS — G309 Alzheimer's disease, unspecified: Secondary | ICD-10-CM | POA: Diagnosis not present

## 2018-02-10 DIAGNOSIS — N400 Enlarged prostate without lower urinary tract symptoms: Secondary | ICD-10-CM | POA: Diagnosis not present

## 2018-02-10 DIAGNOSIS — M549 Dorsalgia, unspecified: Secondary | ICD-10-CM | POA: Diagnosis not present

## 2018-02-10 DIAGNOSIS — F0280 Dementia in other diseases classified elsewhere without behavioral disturbance: Secondary | ICD-10-CM | POA: Diagnosis not present

## 2018-02-10 DIAGNOSIS — G20 Parkinson's disease: Secondary | ICD-10-CM | POA: Diagnosis not present

## 2018-02-10 DIAGNOSIS — Z7982 Long term (current) use of aspirin: Secondary | ICD-10-CM | POA: Diagnosis not present

## 2018-02-10 DIAGNOSIS — Z9181 History of falling: Secondary | ICD-10-CM | POA: Diagnosis not present

## 2018-02-10 DIAGNOSIS — M5136 Other intervertebral disc degeneration, lumbar region: Secondary | ICD-10-CM | POA: Diagnosis not present

## 2018-02-10 DIAGNOSIS — Z794 Long term (current) use of insulin: Secondary | ICD-10-CM | POA: Diagnosis not present

## 2018-02-10 DIAGNOSIS — E1165 Type 2 diabetes mellitus with hyperglycemia: Secondary | ICD-10-CM | POA: Diagnosis not present

## 2018-02-15 DIAGNOSIS — G20 Parkinson's disease: Secondary | ICD-10-CM | POA: Diagnosis not present

## 2018-02-15 DIAGNOSIS — I5030 Unspecified diastolic (congestive) heart failure: Secondary | ICD-10-CM | POA: Diagnosis not present

## 2018-02-15 DIAGNOSIS — G309 Alzheimer's disease, unspecified: Secondary | ICD-10-CM | POA: Diagnosis not present

## 2018-02-15 DIAGNOSIS — M5136 Other intervertebral disc degeneration, lumbar region: Secondary | ICD-10-CM | POA: Diagnosis not present

## 2018-02-15 DIAGNOSIS — M549 Dorsalgia, unspecified: Secondary | ICD-10-CM | POA: Diagnosis not present

## 2018-02-15 DIAGNOSIS — F0280 Dementia in other diseases classified elsewhere without behavioral disturbance: Secondary | ICD-10-CM | POA: Diagnosis not present

## 2018-02-23 DIAGNOSIS — G309 Alzheimer's disease, unspecified: Secondary | ICD-10-CM | POA: Diagnosis not present

## 2018-02-23 DIAGNOSIS — I5030 Unspecified diastolic (congestive) heart failure: Secondary | ICD-10-CM | POA: Diagnosis not present

## 2018-02-23 DIAGNOSIS — E785 Hyperlipidemia, unspecified: Secondary | ICD-10-CM | POA: Diagnosis not present

## 2018-02-23 DIAGNOSIS — M549 Dorsalgia, unspecified: Secondary | ICD-10-CM | POA: Diagnosis not present

## 2018-02-23 DIAGNOSIS — E1165 Type 2 diabetes mellitus with hyperglycemia: Secondary | ICD-10-CM | POA: Diagnosis not present

## 2018-02-23 DIAGNOSIS — Z6824 Body mass index (BMI) 24.0-24.9, adult: Secondary | ICD-10-CM | POA: Diagnosis not present

## 2018-02-23 DIAGNOSIS — M5136 Other intervertebral disc degeneration, lumbar region: Secondary | ICD-10-CM | POA: Diagnosis not present

## 2018-02-23 DIAGNOSIS — G20 Parkinson's disease: Secondary | ICD-10-CM | POA: Diagnosis not present

## 2018-02-23 DIAGNOSIS — F419 Anxiety disorder, unspecified: Secondary | ICD-10-CM | POA: Diagnosis not present

## 2018-02-23 DIAGNOSIS — F0280 Dementia in other diseases classified elsewhere without behavioral disturbance: Secondary | ICD-10-CM | POA: Diagnosis not present

## 2018-03-01 DIAGNOSIS — F0280 Dementia in other diseases classified elsewhere without behavioral disturbance: Secondary | ICD-10-CM | POA: Diagnosis not present

## 2018-03-01 DIAGNOSIS — I5030 Unspecified diastolic (congestive) heart failure: Secondary | ICD-10-CM | POA: Diagnosis not present

## 2018-03-01 DIAGNOSIS — M549 Dorsalgia, unspecified: Secondary | ICD-10-CM | POA: Diagnosis not present

## 2018-03-01 DIAGNOSIS — M5136 Other intervertebral disc degeneration, lumbar region: Secondary | ICD-10-CM | POA: Diagnosis not present

## 2018-03-01 DIAGNOSIS — G20 Parkinson's disease: Secondary | ICD-10-CM | POA: Diagnosis not present

## 2018-03-01 DIAGNOSIS — G309 Alzheimer's disease, unspecified: Secondary | ICD-10-CM | POA: Diagnosis not present

## 2018-03-08 DIAGNOSIS — G20 Parkinson's disease: Secondary | ICD-10-CM | POA: Diagnosis not present

## 2018-03-08 DIAGNOSIS — G309 Alzheimer's disease, unspecified: Secondary | ICD-10-CM | POA: Diagnosis not present

## 2018-03-08 DIAGNOSIS — F0280 Dementia in other diseases classified elsewhere without behavioral disturbance: Secondary | ICD-10-CM | POA: Diagnosis not present

## 2018-03-08 DIAGNOSIS — M549 Dorsalgia, unspecified: Secondary | ICD-10-CM | POA: Diagnosis not present

## 2018-03-08 DIAGNOSIS — M5136 Other intervertebral disc degeneration, lumbar region: Secondary | ICD-10-CM | POA: Diagnosis not present

## 2018-03-08 DIAGNOSIS — I5030 Unspecified diastolic (congestive) heart failure: Secondary | ICD-10-CM | POA: Diagnosis not present

## 2018-03-12 DIAGNOSIS — G20 Parkinson's disease: Secondary | ICD-10-CM | POA: Diagnosis not present

## 2018-03-12 DIAGNOSIS — Z794 Long term (current) use of insulin: Secondary | ICD-10-CM | POA: Diagnosis not present

## 2018-03-12 DIAGNOSIS — E785 Hyperlipidemia, unspecified: Secondary | ICD-10-CM | POA: Diagnosis not present

## 2018-03-12 DIAGNOSIS — M5136 Other intervertebral disc degeneration, lumbar region: Secondary | ICD-10-CM | POA: Diagnosis not present

## 2018-03-12 DIAGNOSIS — Z7982 Long term (current) use of aspirin: Secondary | ICD-10-CM | POA: Diagnosis not present

## 2018-03-12 DIAGNOSIS — E1165 Type 2 diabetes mellitus with hyperglycemia: Secondary | ICD-10-CM | POA: Diagnosis not present

## 2018-03-12 DIAGNOSIS — F419 Anxiety disorder, unspecified: Secondary | ICD-10-CM | POA: Diagnosis not present

## 2018-03-12 DIAGNOSIS — G309 Alzheimer's disease, unspecified: Secondary | ICD-10-CM | POA: Diagnosis not present

## 2018-03-12 DIAGNOSIS — M549 Dorsalgia, unspecified: Secondary | ICD-10-CM | POA: Diagnosis not present

## 2018-03-12 DIAGNOSIS — Z9181 History of falling: Secondary | ICD-10-CM | POA: Diagnosis not present

## 2018-03-12 DIAGNOSIS — N400 Enlarged prostate without lower urinary tract symptoms: Secondary | ICD-10-CM | POA: Diagnosis not present

## 2018-03-12 DIAGNOSIS — F0280 Dementia in other diseases classified elsewhere without behavioral disturbance: Secondary | ICD-10-CM | POA: Diagnosis not present

## 2018-03-12 DIAGNOSIS — I5030 Unspecified diastolic (congestive) heart failure: Secondary | ICD-10-CM | POA: Diagnosis not present

## 2018-03-14 DIAGNOSIS — I5030 Unspecified diastolic (congestive) heart failure: Secondary | ICD-10-CM | POA: Diagnosis not present

## 2018-03-14 DIAGNOSIS — F0280 Dementia in other diseases classified elsewhere without behavioral disturbance: Secondary | ICD-10-CM | POA: Diagnosis not present

## 2018-03-14 DIAGNOSIS — M5136 Other intervertebral disc degeneration, lumbar region: Secondary | ICD-10-CM | POA: Diagnosis not present

## 2018-03-14 DIAGNOSIS — G309 Alzheimer's disease, unspecified: Secondary | ICD-10-CM | POA: Diagnosis not present

## 2018-03-14 DIAGNOSIS — G20 Parkinson's disease: Secondary | ICD-10-CM | POA: Diagnosis not present

## 2018-03-14 DIAGNOSIS — M549 Dorsalgia, unspecified: Secondary | ICD-10-CM | POA: Diagnosis not present

## 2018-03-15 DIAGNOSIS — G20 Parkinson's disease: Secondary | ICD-10-CM | POA: Diagnosis not present

## 2018-03-15 DIAGNOSIS — F0280 Dementia in other diseases classified elsewhere without behavioral disturbance: Secondary | ICD-10-CM | POA: Diagnosis not present

## 2018-03-15 DIAGNOSIS — I5030 Unspecified diastolic (congestive) heart failure: Secondary | ICD-10-CM | POA: Diagnosis not present

## 2018-03-15 DIAGNOSIS — M5136 Other intervertebral disc degeneration, lumbar region: Secondary | ICD-10-CM | POA: Diagnosis not present

## 2018-03-15 DIAGNOSIS — M549 Dorsalgia, unspecified: Secondary | ICD-10-CM | POA: Diagnosis not present

## 2018-03-15 DIAGNOSIS — G309 Alzheimer's disease, unspecified: Secondary | ICD-10-CM | POA: Diagnosis not present

## 2018-03-17 DIAGNOSIS — M549 Dorsalgia, unspecified: Secondary | ICD-10-CM | POA: Diagnosis not present

## 2018-03-17 DIAGNOSIS — G20 Parkinson's disease: Secondary | ICD-10-CM | POA: Diagnosis not present

## 2018-03-17 DIAGNOSIS — I5030 Unspecified diastolic (congestive) heart failure: Secondary | ICD-10-CM | POA: Diagnosis not present

## 2018-03-17 DIAGNOSIS — M5136 Other intervertebral disc degeneration, lumbar region: Secondary | ICD-10-CM | POA: Diagnosis not present

## 2018-03-17 DIAGNOSIS — G309 Alzheimer's disease, unspecified: Secondary | ICD-10-CM | POA: Diagnosis not present

## 2018-03-17 DIAGNOSIS — F0280 Dementia in other diseases classified elsewhere without behavioral disturbance: Secondary | ICD-10-CM | POA: Diagnosis not present

## 2018-03-18 DIAGNOSIS — M549 Dorsalgia, unspecified: Secondary | ICD-10-CM | POA: Diagnosis not present

## 2018-03-18 DIAGNOSIS — M5136 Other intervertebral disc degeneration, lumbar region: Secondary | ICD-10-CM | POA: Diagnosis not present

## 2018-03-18 DIAGNOSIS — F0280 Dementia in other diseases classified elsewhere without behavioral disturbance: Secondary | ICD-10-CM | POA: Diagnosis not present

## 2018-03-18 DIAGNOSIS — I5030 Unspecified diastolic (congestive) heart failure: Secondary | ICD-10-CM | POA: Diagnosis not present

## 2018-03-18 DIAGNOSIS — G309 Alzheimer's disease, unspecified: Secondary | ICD-10-CM | POA: Diagnosis not present

## 2018-03-18 DIAGNOSIS — G20 Parkinson's disease: Secondary | ICD-10-CM | POA: Diagnosis not present

## 2018-03-21 DIAGNOSIS — M549 Dorsalgia, unspecified: Secondary | ICD-10-CM | POA: Diagnosis not present

## 2018-03-21 DIAGNOSIS — I5030 Unspecified diastolic (congestive) heart failure: Secondary | ICD-10-CM | POA: Diagnosis not present

## 2018-03-21 DIAGNOSIS — G20 Parkinson's disease: Secondary | ICD-10-CM | POA: Diagnosis not present

## 2018-03-21 DIAGNOSIS — G309 Alzheimer's disease, unspecified: Secondary | ICD-10-CM | POA: Diagnosis not present

## 2018-03-21 DIAGNOSIS — M5136 Other intervertebral disc degeneration, lumbar region: Secondary | ICD-10-CM | POA: Diagnosis not present

## 2018-03-21 DIAGNOSIS — F0280 Dementia in other diseases classified elsewhere without behavioral disturbance: Secondary | ICD-10-CM | POA: Diagnosis not present

## 2018-03-22 DIAGNOSIS — F0280 Dementia in other diseases classified elsewhere without behavioral disturbance: Secondary | ICD-10-CM | POA: Diagnosis not present

## 2018-03-22 DIAGNOSIS — G309 Alzheimer's disease, unspecified: Secondary | ICD-10-CM | POA: Diagnosis not present

## 2018-03-22 DIAGNOSIS — I5030 Unspecified diastolic (congestive) heart failure: Secondary | ICD-10-CM | POA: Diagnosis not present

## 2018-03-22 DIAGNOSIS — Z6824 Body mass index (BMI) 24.0-24.9, adult: Secondary | ICD-10-CM | POA: Diagnosis not present

## 2018-03-22 DIAGNOSIS — F419 Anxiety disorder, unspecified: Secondary | ICD-10-CM | POA: Diagnosis not present

## 2018-03-22 DIAGNOSIS — M5136 Other intervertebral disc degeneration, lumbar region: Secondary | ICD-10-CM | POA: Diagnosis not present

## 2018-03-22 DIAGNOSIS — E1165 Type 2 diabetes mellitus with hyperglycemia: Secondary | ICD-10-CM | POA: Diagnosis not present

## 2018-03-22 DIAGNOSIS — G20 Parkinson's disease: Secondary | ICD-10-CM | POA: Diagnosis not present

## 2018-03-22 DIAGNOSIS — M549 Dorsalgia, unspecified: Secondary | ICD-10-CM | POA: Diagnosis not present

## 2018-03-24 DIAGNOSIS — G20 Parkinson's disease: Secondary | ICD-10-CM | POA: Diagnosis not present

## 2018-03-24 DIAGNOSIS — M5136 Other intervertebral disc degeneration, lumbar region: Secondary | ICD-10-CM | POA: Diagnosis not present

## 2018-03-24 DIAGNOSIS — M549 Dorsalgia, unspecified: Secondary | ICD-10-CM | POA: Diagnosis not present

## 2018-03-24 DIAGNOSIS — G309 Alzheimer's disease, unspecified: Secondary | ICD-10-CM | POA: Diagnosis not present

## 2018-03-24 DIAGNOSIS — F0280 Dementia in other diseases classified elsewhere without behavioral disturbance: Secondary | ICD-10-CM | POA: Diagnosis not present

## 2018-03-24 DIAGNOSIS — I5030 Unspecified diastolic (congestive) heart failure: Secondary | ICD-10-CM | POA: Diagnosis not present

## 2018-03-25 DIAGNOSIS — G20 Parkinson's disease: Secondary | ICD-10-CM | POA: Diagnosis not present

## 2018-03-25 DIAGNOSIS — G309 Alzheimer's disease, unspecified: Secondary | ICD-10-CM | POA: Diagnosis not present

## 2018-03-25 DIAGNOSIS — I5030 Unspecified diastolic (congestive) heart failure: Secondary | ICD-10-CM | POA: Diagnosis not present

## 2018-03-25 DIAGNOSIS — M5136 Other intervertebral disc degeneration, lumbar region: Secondary | ICD-10-CM | POA: Diagnosis not present

## 2018-03-25 DIAGNOSIS — F0280 Dementia in other diseases classified elsewhere without behavioral disturbance: Secondary | ICD-10-CM | POA: Diagnosis not present

## 2018-03-25 DIAGNOSIS — M549 Dorsalgia, unspecified: Secondary | ICD-10-CM | POA: Diagnosis not present

## 2018-03-29 DIAGNOSIS — F0280 Dementia in other diseases classified elsewhere without behavioral disturbance: Secondary | ICD-10-CM | POA: Diagnosis not present

## 2018-03-29 DIAGNOSIS — M5136 Other intervertebral disc degeneration, lumbar region: Secondary | ICD-10-CM | POA: Diagnosis not present

## 2018-03-29 DIAGNOSIS — G20 Parkinson's disease: Secondary | ICD-10-CM | POA: Diagnosis not present

## 2018-03-29 DIAGNOSIS — M549 Dorsalgia, unspecified: Secondary | ICD-10-CM | POA: Diagnosis not present

## 2018-03-29 DIAGNOSIS — G309 Alzheimer's disease, unspecified: Secondary | ICD-10-CM | POA: Diagnosis not present

## 2018-03-29 DIAGNOSIS — I5030 Unspecified diastolic (congestive) heart failure: Secondary | ICD-10-CM | POA: Diagnosis not present

## 2018-03-31 DIAGNOSIS — G20 Parkinson's disease: Secondary | ICD-10-CM | POA: Diagnosis not present

## 2018-03-31 DIAGNOSIS — M5136 Other intervertebral disc degeneration, lumbar region: Secondary | ICD-10-CM | POA: Diagnosis not present

## 2018-03-31 DIAGNOSIS — F0280 Dementia in other diseases classified elsewhere without behavioral disturbance: Secondary | ICD-10-CM | POA: Diagnosis not present

## 2018-03-31 DIAGNOSIS — G309 Alzheimer's disease, unspecified: Secondary | ICD-10-CM | POA: Diagnosis not present

## 2018-03-31 DIAGNOSIS — M549 Dorsalgia, unspecified: Secondary | ICD-10-CM | POA: Diagnosis not present

## 2018-03-31 DIAGNOSIS — I5030 Unspecified diastolic (congestive) heart failure: Secondary | ICD-10-CM | POA: Diagnosis not present

## 2018-04-05 DIAGNOSIS — I5030 Unspecified diastolic (congestive) heart failure: Secondary | ICD-10-CM | POA: Diagnosis not present

## 2018-04-05 DIAGNOSIS — M5136 Other intervertebral disc degeneration, lumbar region: Secondary | ICD-10-CM | POA: Diagnosis not present

## 2018-04-05 DIAGNOSIS — M549 Dorsalgia, unspecified: Secondary | ICD-10-CM | POA: Diagnosis not present

## 2018-04-05 DIAGNOSIS — G309 Alzheimer's disease, unspecified: Secondary | ICD-10-CM | POA: Diagnosis not present

## 2018-04-05 DIAGNOSIS — G20 Parkinson's disease: Secondary | ICD-10-CM | POA: Diagnosis not present

## 2018-04-05 DIAGNOSIS — F0280 Dementia in other diseases classified elsewhere without behavioral disturbance: Secondary | ICD-10-CM | POA: Diagnosis not present

## 2018-04-07 DIAGNOSIS — I110 Hypertensive heart disease with heart failure: Secondary | ICD-10-CM | POA: Diagnosis not present

## 2018-04-07 DIAGNOSIS — M549 Dorsalgia, unspecified: Secondary | ICD-10-CM | POA: Diagnosis not present

## 2018-04-07 DIAGNOSIS — F0280 Dementia in other diseases classified elsewhere without behavioral disturbance: Secondary | ICD-10-CM | POA: Diagnosis not present

## 2018-04-07 DIAGNOSIS — M5136 Other intervertebral disc degeneration, lumbar region: Secondary | ICD-10-CM | POA: Diagnosis not present

## 2018-04-07 DIAGNOSIS — G20 Parkinson's disease: Secondary | ICD-10-CM | POA: Diagnosis not present

## 2018-04-07 DIAGNOSIS — E785 Hyperlipidemia, unspecified: Secondary | ICD-10-CM | POA: Diagnosis not present

## 2018-04-07 DIAGNOSIS — G309 Alzheimer's disease, unspecified: Secondary | ICD-10-CM | POA: Diagnosis not present

## 2018-04-07 DIAGNOSIS — I5030 Unspecified diastolic (congestive) heart failure: Secondary | ICD-10-CM | POA: Diagnosis not present

## 2018-04-07 DIAGNOSIS — Z48815 Encounter for surgical aftercare following surgery on the digestive system: Secondary | ICD-10-CM | POA: Diagnosis not present

## 2018-04-10 DIAGNOSIS — Z7982 Long term (current) use of aspirin: Secondary | ICD-10-CM | POA: Diagnosis not present

## 2018-04-10 DIAGNOSIS — W19XXXD Unspecified fall, subsequent encounter: Secondary | ICD-10-CM | POA: Diagnosis not present

## 2018-04-10 DIAGNOSIS — R05 Cough: Secondary | ICD-10-CM | POA: Diagnosis not present

## 2018-04-10 DIAGNOSIS — G20 Parkinson's disease: Secondary | ICD-10-CM | POA: Diagnosis not present

## 2018-04-10 DIAGNOSIS — G309 Alzheimer's disease, unspecified: Secondary | ICD-10-CM | POA: Diagnosis not present

## 2018-04-10 DIAGNOSIS — Z79899 Other long term (current) drug therapy: Secondary | ICD-10-CM | POA: Diagnosis not present

## 2018-04-10 DIAGNOSIS — S2231XD Fracture of one rib, right side, subsequent encounter for fracture with routine healing: Secondary | ICD-10-CM | POA: Diagnosis not present

## 2018-04-10 DIAGNOSIS — R410 Disorientation, unspecified: Secondary | ICD-10-CM | POA: Diagnosis not present

## 2018-04-10 DIAGNOSIS — K660 Peritoneal adhesions (postprocedural) (postinfection): Secondary | ICD-10-CM | POA: Diagnosis present

## 2018-04-10 DIAGNOSIS — K8063 Calculus of gallbladder and bile duct with acute cholecystitis with obstruction: Secondary | ICD-10-CM | POA: Diagnosis not present

## 2018-04-10 DIAGNOSIS — F0281 Dementia in other diseases classified elsewhere with behavioral disturbance: Secondary | ICD-10-CM | POA: Diagnosis present

## 2018-04-10 DIAGNOSIS — K8012 Calculus of gallbladder with acute and chronic cholecystitis without obstruction: Secondary | ICD-10-CM | POA: Diagnosis not present

## 2018-04-10 DIAGNOSIS — S2231XA Fracture of one rib, right side, initial encounter for closed fracture: Secondary | ICD-10-CM | POA: Diagnosis not present

## 2018-04-10 DIAGNOSIS — K8013 Calculus of gallbladder with acute and chronic cholecystitis with obstruction: Secondary | ICD-10-CM | POA: Diagnosis present

## 2018-04-10 DIAGNOSIS — M5136 Other intervertebral disc degeneration, lumbar region: Secondary | ICD-10-CM | POA: Diagnosis not present

## 2018-04-10 DIAGNOSIS — J029 Acute pharyngitis, unspecified: Secondary | ICD-10-CM | POA: Diagnosis not present

## 2018-04-10 DIAGNOSIS — R070 Pain in throat: Secondary | ICD-10-CM | POA: Diagnosis not present

## 2018-04-10 DIAGNOSIS — R52 Pain, unspecified: Secondary | ICD-10-CM | POA: Diagnosis not present

## 2018-04-10 DIAGNOSIS — R1011 Right upper quadrant pain: Secondary | ICD-10-CM | POA: Diagnosis not present

## 2018-04-10 DIAGNOSIS — Z7984 Long term (current) use of oral hypoglycemic drugs: Secondary | ICD-10-CM | POA: Diagnosis not present

## 2018-04-10 DIAGNOSIS — D72829 Elevated white blood cell count, unspecified: Secondary | ICD-10-CM | POA: Diagnosis not present

## 2018-04-10 DIAGNOSIS — E871 Hypo-osmolality and hyponatremia: Secondary | ICD-10-CM | POA: Diagnosis not present

## 2018-04-10 DIAGNOSIS — I5030 Unspecified diastolic (congestive) heart failure: Secondary | ICD-10-CM | POA: Diagnosis not present

## 2018-04-10 DIAGNOSIS — K8010 Calculus of gallbladder with chronic cholecystitis without obstruction: Secondary | ICD-10-CM | POA: Diagnosis not present

## 2018-04-10 DIAGNOSIS — F0280 Dementia in other diseases classified elsewhere without behavioral disturbance: Secondary | ICD-10-CM | POA: Diagnosis not present

## 2018-04-10 DIAGNOSIS — Z7401 Bed confinement status: Secondary | ICD-10-CM | POA: Diagnosis not present

## 2018-04-10 DIAGNOSIS — J9811 Atelectasis: Secondary | ICD-10-CM | POA: Diagnosis not present

## 2018-04-10 DIAGNOSIS — Z8673 Personal history of transient ischemic attack (TIA), and cerebral infarction without residual deficits: Secondary | ICD-10-CM | POA: Diagnosis not present

## 2018-04-10 DIAGNOSIS — E785 Hyperlipidemia, unspecified: Secondary | ICD-10-CM | POA: Diagnosis not present

## 2018-04-10 DIAGNOSIS — Z9049 Acquired absence of other specified parts of digestive tract: Secondary | ICD-10-CM | POA: Diagnosis not present

## 2018-04-10 DIAGNOSIS — Z7952 Long term (current) use of systemic steroids: Secondary | ICD-10-CM | POA: Diagnosis not present

## 2018-04-10 DIAGNOSIS — K8020 Calculus of gallbladder without cholecystitis without obstruction: Secondary | ICD-10-CM | POA: Diagnosis not present

## 2018-04-10 DIAGNOSIS — E1165 Type 2 diabetes mellitus with hyperglycemia: Secondary | ICD-10-CM | POA: Diagnosis not present

## 2018-04-10 DIAGNOSIS — D500 Iron deficiency anemia secondary to blood loss (chronic): Secondary | ICD-10-CM | POA: Diagnosis not present

## 2018-04-10 DIAGNOSIS — N179 Acute kidney failure, unspecified: Secondary | ICD-10-CM | POA: Diagnosis not present

## 2018-04-10 DIAGNOSIS — R1111 Vomiting without nausea: Secondary | ICD-10-CM | POA: Diagnosis not present

## 2018-04-10 DIAGNOSIS — I10 Essential (primary) hypertension: Secondary | ICD-10-CM | POA: Diagnosis present

## 2018-04-10 DIAGNOSIS — R110 Nausea: Secondary | ICD-10-CM | POA: Diagnosis not present

## 2018-04-10 DIAGNOSIS — R1084 Generalized abdominal pain: Secondary | ICD-10-CM | POA: Diagnosis not present

## 2018-04-10 DIAGNOSIS — R1033 Periumbilical pain: Secondary | ICD-10-CM | POA: Diagnosis not present

## 2018-04-10 DIAGNOSIS — M1990 Unspecified osteoarthritis, unspecified site: Secondary | ICD-10-CM | POA: Diagnosis present

## 2018-04-10 DIAGNOSIS — F0390 Unspecified dementia without behavioral disturbance: Secondary | ICD-10-CM | POA: Diagnosis not present

## 2018-04-10 DIAGNOSIS — E860 Dehydration: Secondary | ICD-10-CM | POA: Diagnosis present

## 2018-04-10 DIAGNOSIS — B952 Enterococcus as the cause of diseases classified elsewhere: Secondary | ICD-10-CM | POA: Diagnosis present

## 2018-04-10 DIAGNOSIS — K812 Acute cholecystitis with chronic cholecystitis: Secondary | ICD-10-CM | POA: Diagnosis not present

## 2018-04-10 DIAGNOSIS — E119 Type 2 diabetes mellitus without complications: Secondary | ICD-10-CM | POA: Diagnosis not present

## 2018-04-10 DIAGNOSIS — B961 Klebsiella pneumoniae [K. pneumoniae] as the cause of diseases classified elsewhere: Secondary | ICD-10-CM | POA: Diagnosis present

## 2018-04-10 DIAGNOSIS — M549 Dorsalgia, unspecified: Secondary | ICD-10-CM | POA: Diagnosis not present

## 2018-04-10 DIAGNOSIS — R112 Nausea with vomiting, unspecified: Secondary | ICD-10-CM | POA: Diagnosis not present

## 2018-04-10 DIAGNOSIS — M2550 Pain in unspecified joint: Secondary | ICD-10-CM | POA: Diagnosis not present

## 2018-04-11 DIAGNOSIS — E785 Hyperlipidemia, unspecified: Secondary | ICD-10-CM | POA: Diagnosis not present

## 2018-04-11 DIAGNOSIS — M549 Dorsalgia, unspecified: Secondary | ICD-10-CM | POA: Diagnosis not present

## 2018-04-11 DIAGNOSIS — F0280 Dementia in other diseases classified elsewhere without behavioral disturbance: Secondary | ICD-10-CM | POA: Diagnosis not present

## 2018-04-11 DIAGNOSIS — M5136 Other intervertebral disc degeneration, lumbar region: Secondary | ICD-10-CM | POA: Diagnosis not present

## 2018-04-11 DIAGNOSIS — G309 Alzheimer's disease, unspecified: Secondary | ICD-10-CM | POA: Diagnosis not present

## 2018-04-11 DIAGNOSIS — G20 Parkinson's disease: Secondary | ICD-10-CM | POA: Diagnosis not present

## 2018-04-11 DIAGNOSIS — I5030 Unspecified diastolic (congestive) heart failure: Secondary | ICD-10-CM | POA: Diagnosis not present

## 2018-04-14 DIAGNOSIS — K8012 Calculus of gallbladder with acute and chronic cholecystitis without obstruction: Secondary | ICD-10-CM | POA: Diagnosis not present

## 2018-04-18 DIAGNOSIS — Z7401 Bed confinement status: Secondary | ICD-10-CM | POA: Diagnosis not present

## 2018-04-18 DIAGNOSIS — R05 Cough: Secondary | ICD-10-CM | POA: Diagnosis not present

## 2018-04-18 DIAGNOSIS — R1111 Vomiting without nausea: Secondary | ICD-10-CM | POA: Diagnosis not present

## 2018-04-18 DIAGNOSIS — R410 Disorientation, unspecified: Secondary | ICD-10-CM | POA: Diagnosis not present

## 2018-04-18 DIAGNOSIS — N179 Acute kidney failure, unspecified: Secondary | ICD-10-CM | POA: Diagnosis not present

## 2018-04-18 DIAGNOSIS — R110 Nausea: Secondary | ICD-10-CM | POA: Diagnosis not present

## 2018-04-18 DIAGNOSIS — S2231XD Fracture of one rib, right side, subsequent encounter for fracture with routine healing: Secondary | ICD-10-CM | POA: Diagnosis not present

## 2018-04-18 DIAGNOSIS — G20 Parkinson's disease: Secondary | ICD-10-CM | POA: Diagnosis not present

## 2018-04-18 DIAGNOSIS — W19XXXD Unspecified fall, subsequent encounter: Secondary | ICD-10-CM | POA: Diagnosis not present

## 2018-04-18 DIAGNOSIS — I10 Essential (primary) hypertension: Secondary | ICD-10-CM | POA: Diagnosis not present

## 2018-04-18 DIAGNOSIS — E119 Type 2 diabetes mellitus without complications: Secondary | ICD-10-CM | POA: Diagnosis not present

## 2018-04-18 DIAGNOSIS — M2550 Pain in unspecified joint: Secondary | ICD-10-CM | POA: Diagnosis not present

## 2018-04-18 DIAGNOSIS — M1990 Unspecified osteoarthritis, unspecified site: Secondary | ICD-10-CM | POA: Diagnosis not present

## 2018-04-18 DIAGNOSIS — E785 Hyperlipidemia, unspecified: Secondary | ICD-10-CM | POA: Diagnosis not present

## 2018-04-18 DIAGNOSIS — F0390 Unspecified dementia without behavioral disturbance: Secondary | ICD-10-CM | POA: Diagnosis not present

## 2018-04-18 DIAGNOSIS — D500 Iron deficiency anemia secondary to blood loss (chronic): Secondary | ICD-10-CM | POA: Diagnosis not present

## 2018-04-18 DIAGNOSIS — K812 Acute cholecystitis with chronic cholecystitis: Secondary | ICD-10-CM | POA: Diagnosis not present

## 2018-04-20 ENCOUNTER — Encounter: Payer: Self-pay | Admitting: *Deleted

## 2018-04-20 ENCOUNTER — Other Ambulatory Visit: Payer: Self-pay | Admitting: *Deleted

## 2018-04-20 DIAGNOSIS — I10 Essential (primary) hypertension: Secondary | ICD-10-CM

## 2018-04-20 NOTE — Patient Outreach (Signed)
[facility]) Care Management  04/20/2018  FAROUK VIVERO 23-Apr-1934 [id number]   Received call from SNF facility discharge planner, Janett Billow, to request [place] Management follow up post SNF discharge. Mr. Eleazer is slated to discharge to home with home health today 04/20/18. Janett Billow indicates Mr. Atha's wife should be contacted to discuss [place] Management services.  Telephone call to Mrs. [place] at number provided at [phone].   Explained [facility] Care Management services and explained how [facility] Care Management will not interfere or replace services provided by home health. Vikash Nest is agreeable and verbal consent obtained for [place] Management services.   Mrs. Frisch confirms Primary Care MD is Dr. Laqueta Due with [facility].   States there is a concern regarding medication cost, especially DM medications. Agreeable to [facility] Pharmacist referral.   Joycelyn Schmid states that Mr.Loberg will have someone with him at all times at home. She is hopeful she can keep him at home as long as she can. However, she states he has confusion, especially at night. She does not have long term care insurance and is not sure of her long term plan with him. She is interested in assistance with community resources or input from [place]. Agreeable to [facility] LCSW referral.   Denies having any concerns with transportation. States they have family members that have committed to helping with transportation. Joycelyn Schmid states Mr. Reine has limited mobility and she needs all the assistance she can have from family.  Joycelyn Schmid also agreeable to [place] follow up regarding DM. He also has a history of dementia, Parkinson's, TIA, HTN.   Johndavid Geralds (wife) states she should be contacted for calls preferrably on the home phone [phone] and her cell secondary at [phone].   Notification sent to facility discharge planner and [facility] to make aware  [place] Management to follow post discharge from SNF.   Margaret expressed appreciation of the call.   Marthenia Rolling, MSN-Ed, RN,BSN [place] Acute Care Coordinator [phone]

## 2018-04-21 ENCOUNTER — Other Ambulatory Visit: Payer: Self-pay | Admitting: Pharmacist

## 2018-04-21 DIAGNOSIS — G20 Parkinson's disease: Secondary | ICD-10-CM

## 2018-04-21 DIAGNOSIS — Z09 Encounter for follow-up examination after completed treatment for conditions other than malignant neoplasm: Secondary | ICD-10-CM | POA: Insufficient documentation

## 2018-04-21 NOTE — Patient Outreach (Signed)
[facility]) Care Management  [place]   04/21/2018  REDA GETTIS July 05, 1934 [id number]  Reason for referral: Medication Assistance  Referral source: [facility] Inpatient Liaison Current insurance: Medicare  PMHx includes but not limited to:  Parkinson's Disease, dementia, HTN, prior stroke, T2DM  Outreach: Unsuccessful telephone call attempt #1 to patient. HIPAA compliant voicemail left requesting a return call  Plan:  I will mail patient an unsuccessful outreach letter and will reach out to patient and spouse again within 1-2 weeks.   Ralene Bathe, PharmD, [place] [phone]

## 2018-04-22 ENCOUNTER — Other Ambulatory Visit: Payer: Self-pay

## 2018-04-22 ENCOUNTER — Other Ambulatory Visit: Payer: Self-pay | Admitting: *Deleted

## 2018-04-22 DIAGNOSIS — M5136 Other intervertebral disc degeneration, lumbar region: Secondary | ICD-10-CM | POA: Diagnosis not present

## 2018-04-22 DIAGNOSIS — N400 Enlarged prostate without lower urinary tract symptoms: Secondary | ICD-10-CM | POA: Diagnosis not present

## 2018-04-22 DIAGNOSIS — D500 Iron deficiency anemia secondary to blood loss (chronic): Secondary | ICD-10-CM | POA: Diagnosis not present

## 2018-04-22 DIAGNOSIS — S2231XD Fracture of one rib, right side, subsequent encounter for fracture with routine healing: Secondary | ICD-10-CM | POA: Diagnosis not present

## 2018-04-22 DIAGNOSIS — I5030 Unspecified diastolic (congestive) heart failure: Secondary | ICD-10-CM | POA: Diagnosis not present

## 2018-04-22 DIAGNOSIS — N179 Acute kidney failure, unspecified: Secondary | ICD-10-CM | POA: Diagnosis not present

## 2018-04-22 DIAGNOSIS — G20 Parkinson's disease: Secondary | ICD-10-CM | POA: Diagnosis not present

## 2018-04-22 DIAGNOSIS — F0280 Dementia in other diseases classified elsewhere without behavioral disturbance: Secondary | ICD-10-CM | POA: Diagnosis not present

## 2018-04-22 DIAGNOSIS — Z48815 Encounter for surgical aftercare following surgery on the digestive system: Secondary | ICD-10-CM | POA: Diagnosis not present

## 2018-04-22 DIAGNOSIS — E1165 Type 2 diabetes mellitus with hyperglycemia: Secondary | ICD-10-CM | POA: Diagnosis not present

## 2018-04-22 DIAGNOSIS — I110 Hypertensive heart disease with heart failure: Secondary | ICD-10-CM | POA: Diagnosis not present

## 2018-04-22 DIAGNOSIS — F419 Anxiety disorder, unspecified: Secondary | ICD-10-CM | POA: Diagnosis not present

## 2018-04-22 DIAGNOSIS — E785 Hyperlipidemia, unspecified: Secondary | ICD-10-CM | POA: Diagnosis not present

## 2018-04-22 DIAGNOSIS — M549 Dorsalgia, unspecified: Secondary | ICD-10-CM | POA: Diagnosis not present

## 2018-04-22 DIAGNOSIS — Z7982 Long term (current) use of aspirin: Secondary | ICD-10-CM | POA: Diagnosis not present

## 2018-04-22 DIAGNOSIS — M1990 Unspecified osteoarthritis, unspecified site: Secondary | ICD-10-CM | POA: Diagnosis not present

## 2018-04-22 DIAGNOSIS — Z794 Long term (current) use of insulin: Secondary | ICD-10-CM | POA: Diagnosis not present

## 2018-04-22 DIAGNOSIS — G309 Alzheimer's disease, unspecified: Secondary | ICD-10-CM | POA: Diagnosis not present

## 2018-04-22 DIAGNOSIS — Z79891 Long term (current) use of opiate analgesic: Secondary | ICD-10-CM | POA: Diagnosis not present

## 2018-04-22 DIAGNOSIS — Z9181 History of falling: Secondary | ICD-10-CM | POA: Diagnosis not present

## 2018-04-22 NOTE — Patient Outreach (Signed)
Transition of care: Discharged from [facility] on 04/20/2018.   According to wife [place] stated they could not handle patient due to being combative.   Placed call to patient and spoke with wife, Sully Dyment.  ( identified as contact person)  Mrs. Gascoigne states that patient was combative all night, Reports refusing to wear a diaper and he peed all over the mattress last night, Wife states patient fell off the bed twice during the night and grandson had to help get patient back in the bed.  Wife states patient is not able to walk and is bedbound.  Wife reports she is not able to manage patient at home and she does not know what to do.   Reports she has all medications and if giving patient medications as prescribed, Reports she called MD office today and was told to give patient 3 XANAX per day just like he got in the nursing home.  Wife states she took him to surgeon yesterday and has follow up planned with primary MD on 04/26/2018.  Wife reports her grandsons and sons are trying to help her manage patient at home. Wife states she has not heard from [facility] home health yet.  Reports patient was ordered PT, OT, ST and nursing. Reports she is out of CBG testing strips and will call in for a refill, Reports last CBG of 220 on 04/21/2018. Reports decreased appetite and not eating well.   PLAN: contact [place] social worker who is assisgned. Why health about start of service.   NOTE:  Placed call to Eduard Clos, [facility] social worker who will reach out to wife today. Placed call back to wife and provided update and Marcie Bal Caldwell's contact number. Placed call to [facility] and spoke with Alyse Low who reports Debbie on of the nurses is scheduled to open patients case today. Placed call back to wife to review update.    Provided my contact information and will plan follow up in 1 week via phone.  THN CM Care Plan Problem One     Most Recent Value   Care Plan Problem One  Recent gall bladder surgery with weakness and confusion.   Role Documenting the Problem One  Care Management [place] for Problem One  Active  THN Long Term Goal   Patient and or wife will report no readmissions to the hospital in the next 31 days.   THN Long Term Goal Start Date  04/22/18  Interventions for Problem One Long Term Goal  Reviewed plan of care with wife, Encouraged patient to see MD. Reviewed concerns for falls. Suggested wife has assistance with patient at all times. Reviewed case with [facility] social worker.   THN CM Short Term Goal #1   Wife will report case opened with home health within the next 7 days.   THN CM Short Term Goal #1 Start Date  04/22/18  Interventions for Short Term Goal #1  Placed call to [facility] health and reviewed orders.  THN CM Short Term Goal #2   Wife will report having CBG testing supplies and monitoring CBG daily for the next 30 days.   THN CM Short Term Goal #2 Start Date  04/22/18  Interventions for Short Term Goal #2  Encouraged wife to get refills and start monitoring and recording. Reviewed when to call MD.       This note and barrier letter sent to MD.  Tomasa Rand, RN, BSN, CEN  [place] Coordinator [phone]

## 2018-04-22 NOTE — Patient Outreach (Signed)
[facility]) Care Management  04/22/2018  Evan Vaughan 01/05/1935 [id number]   CSW spoke with pt's wife today by phone. Pt's identity was confirmed and CSW introduced self/role and reason for call.  Per wife, the [facility] RN has just arrived. Pt was at SNF for "2 days but they could not handle him". Per wife, pt has Parkinson with dementia and has become combative and difficult to manage. CSW encouraged her to call his Neurologist today to see if they will consider a RX to help with the behaviors. CSW also discussed private pay options of care at home and she is working to get some money freed up to allow for this. She has a contact person who is helping her to seek some private duty care options.  CSW also spoke briefly with the [facility] to mention the suggestion for Neuro MD call today and asked pt to call me back later. CSW has not heard back from her yet. CSW will plan a follow up call on Monday.  CSW updated THN RNCM to above.   Eduard Clos, MSW, [place] Worker  [place] [phone]

## 2018-04-23 DIAGNOSIS — G20 Parkinson's disease: Secondary | ICD-10-CM | POA: Diagnosis not present

## 2018-04-23 DIAGNOSIS — G309 Alzheimer's disease, unspecified: Secondary | ICD-10-CM | POA: Diagnosis not present

## 2018-04-23 DIAGNOSIS — I5030 Unspecified diastolic (congestive) heart failure: Secondary | ICD-10-CM | POA: Diagnosis not present

## 2018-04-23 DIAGNOSIS — M5136 Other intervertebral disc degeneration, lumbar region: Secondary | ICD-10-CM | POA: Diagnosis not present

## 2018-04-23 DIAGNOSIS — Z48815 Encounter for surgical aftercare following surgery on the digestive system: Secondary | ICD-10-CM | POA: Diagnosis not present

## 2018-04-23 DIAGNOSIS — M549 Dorsalgia, unspecified: Secondary | ICD-10-CM | POA: Diagnosis not present

## 2018-04-25 ENCOUNTER — Other Ambulatory Visit: Payer: Self-pay | Admitting: *Deleted

## 2018-04-25 NOTE — Patient Outreach (Signed)
[facility] [facility]) Care Management  04/25/2018  RIVAAN KENDALL Dec 30, 1934 [id number]   CSW spoke with pt's wife by phone. She reports that the weekend was difficult. She anticipates the [facility] services (PT, CNA, etc) to begin today. She has plans to outreach pt's PCP for assistance with his behavior/mood concerns.   CSW also discussed with wife possible acute options if the need arises for pt including [facility].  CSW will update [facility] RNCM and plan a follow up call to pt's wife again later this week.   Eduard Clos, MSW, [place] Worker  [place] [phone]

## 2018-04-26 DIAGNOSIS — G309 Alzheimer's disease, unspecified: Secondary | ICD-10-CM | POA: Diagnosis not present

## 2018-04-26 DIAGNOSIS — I5030 Unspecified diastolic (congestive) heart failure: Secondary | ICD-10-CM | POA: Diagnosis not present

## 2018-04-26 DIAGNOSIS — F29 Unspecified psychosis not due to a substance or known physiological condition: Secondary | ICD-10-CM | POA: Diagnosis not present

## 2018-04-26 DIAGNOSIS — F0391 Unspecified dementia with behavioral disturbance: Secondary | ICD-10-CM | POA: Diagnosis not present

## 2018-04-26 DIAGNOSIS — R9431 Abnormal electrocardiogram [ECG] [EKG]: Secondary | ICD-10-CM | POA: Diagnosis not present

## 2018-04-26 DIAGNOSIS — R4689 Other symptoms and signs involving appearance and behavior: Secondary | ICD-10-CM | POA: Diagnosis not present

## 2018-04-26 DIAGNOSIS — F0281 Dementia in other diseases classified elsewhere with behavioral disturbance: Secondary | ICD-10-CM | POA: Diagnosis not present

## 2018-04-26 DIAGNOSIS — N183 Chronic kidney disease, stage 3 (moderate): Secondary | ICD-10-CM | POA: Diagnosis not present

## 2018-04-26 DIAGNOSIS — Z48815 Encounter for surgical aftercare following surgery on the digestive system: Secondary | ICD-10-CM | POA: Diagnosis not present

## 2018-04-26 DIAGNOSIS — F0280 Dementia in other diseases classified elsewhere without behavioral disturbance: Secondary | ICD-10-CM | POA: Diagnosis not present

## 2018-04-26 DIAGNOSIS — D696 Thrombocytopenia, unspecified: Secondary | ICD-10-CM | POA: Diagnosis not present

## 2018-04-26 DIAGNOSIS — R456 Violent behavior: Secondary | ICD-10-CM | POA: Diagnosis not present

## 2018-04-26 DIAGNOSIS — F05 Delirium due to known physiological condition: Secondary | ICD-10-CM | POA: Diagnosis not present

## 2018-04-26 DIAGNOSIS — G20 Parkinson's disease: Secondary | ICD-10-CM | POA: Diagnosis not present

## 2018-04-26 DIAGNOSIS — M549 Dorsalgia, unspecified: Secondary | ICD-10-CM | POA: Diagnosis not present

## 2018-04-26 DIAGNOSIS — F329 Major depressive disorder, single episode, unspecified: Secondary | ICD-10-CM | POA: Insufficient documentation

## 2018-04-26 DIAGNOSIS — M5136 Other intervertebral disc degeneration, lumbar region: Secondary | ICD-10-CM | POA: Diagnosis not present

## 2018-04-26 DIAGNOSIS — R4182 Altered mental status, unspecified: Secondary | ICD-10-CM | POA: Diagnosis not present

## 2018-04-26 DIAGNOSIS — N401 Enlarged prostate with lower urinary tract symptoms: Secondary | ICD-10-CM | POA: Diagnosis not present

## 2018-04-27 ENCOUNTER — Other Ambulatory Visit: Payer: Self-pay | Admitting: *Deleted

## 2018-04-27 DIAGNOSIS — R9431 Abnormal electrocardiogram [ECG] [EKG]: Secondary | ICD-10-CM | POA: Diagnosis not present

## 2018-04-27 DIAGNOSIS — S90121A Contusion of right lesser toe(s) without damage to nail, initial encounter: Secondary | ICD-10-CM | POA: Diagnosis not present

## 2018-04-27 DIAGNOSIS — K5900 Constipation, unspecified: Secondary | ICD-10-CM | POA: Diagnosis present

## 2018-04-27 DIAGNOSIS — E1122 Type 2 diabetes mellitus with diabetic chronic kidney disease: Secondary | ICD-10-CM | POA: Diagnosis present

## 2018-04-27 DIAGNOSIS — G20 Parkinson's disease: Secondary | ICD-10-CM | POA: Diagnosis present

## 2018-04-27 DIAGNOSIS — R109 Unspecified abdominal pain: Secondary | ICD-10-CM | POA: Diagnosis not present

## 2018-04-27 DIAGNOSIS — Z9114 Patient's other noncompliance with medication regimen: Secondary | ICD-10-CM | POA: Diagnosis not present

## 2018-04-27 DIAGNOSIS — D473 Essential (hemorrhagic) thrombocythemia: Secondary | ICD-10-CM | POA: Diagnosis not present

## 2018-04-27 DIAGNOSIS — R350 Frequency of micturition: Secondary | ICD-10-CM | POA: Diagnosis present

## 2018-04-27 DIAGNOSIS — N401 Enlarged prostate with lower urinary tract symptoms: Secondary | ICD-10-CM | POA: Diagnosis present

## 2018-04-27 DIAGNOSIS — E119 Type 2 diabetes mellitus without complications: Secondary | ICD-10-CM | POA: Diagnosis not present

## 2018-04-27 DIAGNOSIS — F29 Unspecified psychosis not due to a substance or known physiological condition: Secondary | ICD-10-CM | POA: Diagnosis not present

## 2018-04-27 DIAGNOSIS — I10 Essential (primary) hypertension: Secondary | ICD-10-CM | POA: Diagnosis not present

## 2018-04-27 DIAGNOSIS — F05 Delirium due to known physiological condition: Secondary | ICD-10-CM | POA: Diagnosis present

## 2018-04-27 DIAGNOSIS — E785 Hyperlipidemia, unspecified: Secondary | ICD-10-CM | POA: Diagnosis not present

## 2018-04-27 DIAGNOSIS — E782 Mixed hyperlipidemia: Secondary | ICD-10-CM | POA: Diagnosis present

## 2018-04-27 DIAGNOSIS — F419 Anxiety disorder, unspecified: Secondary | ICD-10-CM | POA: Diagnosis present

## 2018-04-27 DIAGNOSIS — D696 Thrombocytopenia, unspecified: Secondary | ICD-10-CM | POA: Diagnosis not present

## 2018-04-27 DIAGNOSIS — M5136 Other intervertebral disc degeneration, lumbar region: Secondary | ICD-10-CM | POA: Diagnosis not present

## 2018-04-27 DIAGNOSIS — N3090 Cystitis, unspecified without hematuria: Secondary | ICD-10-CM | POA: Diagnosis not present

## 2018-04-27 DIAGNOSIS — E1169 Type 2 diabetes mellitus with other specified complication: Secondary | ICD-10-CM | POA: Diagnosis present

## 2018-04-27 DIAGNOSIS — F0281 Dementia in other diseases classified elsewhere with behavioral disturbance: Secondary | ICD-10-CM | POA: Diagnosis present

## 2018-04-27 DIAGNOSIS — Z79899 Other long term (current) drug therapy: Secondary | ICD-10-CM | POA: Diagnosis not present

## 2018-04-27 DIAGNOSIS — N200 Calculus of kidney: Secondary | ICD-10-CM | POA: Diagnosis present

## 2018-04-27 DIAGNOSIS — F411 Generalized anxiety disorder: Secondary | ICD-10-CM | POA: Diagnosis present

## 2018-04-27 DIAGNOSIS — G894 Chronic pain syndrome: Secondary | ICD-10-CM | POA: Diagnosis present

## 2018-04-27 DIAGNOSIS — N183 Chronic kidney disease, stage 3 (moderate): Secondary | ICD-10-CM | POA: Diagnosis present

## 2018-04-27 DIAGNOSIS — E1159 Type 2 diabetes mellitus with other circulatory complications: Secondary | ICD-10-CM | POA: Diagnosis present

## 2018-04-27 DIAGNOSIS — I129 Hypertensive chronic kidney disease with stage 1 through stage 4 chronic kidney disease, or unspecified chronic kidney disease: Secondary | ICD-10-CM | POA: Diagnosis present

## 2018-04-27 DIAGNOSIS — I440 Atrioventricular block, first degree: Secondary | ICD-10-CM | POA: Diagnosis present

## 2018-04-27 DIAGNOSIS — Z794 Long term (current) use of insulin: Secondary | ICD-10-CM | POA: Diagnosis not present

## 2018-04-27 DIAGNOSIS — L309 Dermatitis, unspecified: Secondary | ICD-10-CM | POA: Diagnosis present

## 2018-04-27 DIAGNOSIS — Z9181 History of falling: Secondary | ICD-10-CM | POA: Diagnosis not present

## 2018-04-27 MED ORDER — TRAMADOL HCL 50 MG PO TABS
50.00 | ORAL_TABLET | ORAL | Status: DC
Start: ? — End: 2018-04-27

## 2018-04-27 MED ORDER — INSULIN GLARGINE 100 UNIT/ML ~~LOC~~ SOLN
25.00 | SUBCUTANEOUS | Status: DC
Start: 2018-04-27 — End: 2018-04-27

## 2018-04-27 MED ORDER — PANTOPRAZOLE SODIUM 20 MG PO TBEC
20.00 | DELAYED_RELEASE_TABLET | ORAL | Status: DC
Start: 2018-05-16 — End: 2018-04-27

## 2018-04-27 MED ORDER — GENERIC EXTERNAL MEDICATION
Status: DC
Start: ? — End: 2018-04-27

## 2018-04-27 MED ORDER — ASPIRIN EC 81 MG PO TBEC
81.00 | DELAYED_RELEASE_TABLET | ORAL | Status: DC
Start: 2018-05-16 — End: 2018-04-27

## 2018-04-27 MED ORDER — BISACODYL 5 MG PO TBEC
5.00 | DELAYED_RELEASE_TABLET | ORAL | Status: DC
Start: ? — End: 2018-04-27

## 2018-04-27 MED ORDER — ACETAMINOPHEN 325 MG PO TABS
650.00 | ORAL_TABLET | ORAL | Status: DC
Start: ? — End: 2018-04-27

## 2018-04-27 MED ORDER — FLUOXETINE HCL 20 MG PO CAPS
40.00 | ORAL_CAPSULE | ORAL | Status: DC
Start: 2018-04-28 — End: 2018-04-27

## 2018-04-27 MED ORDER — ALPRAZOLAM 0.25 MG PO TABS
0.25 | ORAL_TABLET | ORAL | Status: DC
Start: 2018-04-27 — End: 2018-04-27

## 2018-04-27 MED ORDER — AMOXICILLIN-POT CLAVULANATE 875-125 MG PO TABS
1.00 | ORAL_TABLET | ORAL | Status: DC
Start: 2018-04-27 — End: 2018-04-27

## 2018-04-27 MED ORDER — CARBIDOPA-LEVODOPA 25-100 MG PO TABS
0.50 | ORAL_TABLET | ORAL | Status: DC
Start: 2018-05-15 — End: 2018-04-27

## 2018-04-27 MED ORDER — PIOGLITAZONE HCL 45 MG PO TABS
45.00 | ORAL_TABLET | ORAL | Status: DC
Start: 2018-04-28 — End: 2018-04-27

## 2018-04-27 NOTE — Patient Outreach (Signed)
[facility]) Care Management  04/27/2018  Evan Vaughan 06/02/1934 [id number]   [place] spoke with pt's wife by phone who reports pt was admitted to [facility] unit yesterday. "His Doctor also suggested it" and he was not sleeping, hitting self, etc.  CSW offered support to pt's wife and encouraged her to communicate with hospital SW/CM of any concerns or needs related to pt and her ability to care for him at home. She shared with CSW that she is working to get some funds available for possible placement (ALF/memory care) from the hospital.   CSW will update [facility] RNCM and plan a touchbase call to wife on Friday.   Eduard Clos, MSW, [place] Worker  [place] [phone]

## 2018-04-28 ENCOUNTER — Ambulatory Visit: Payer: Self-pay

## 2018-04-28 ENCOUNTER — Other Ambulatory Visit: Payer: Self-pay

## 2018-04-28 NOTE — Patient Outreach (Signed)
Care coordination: Message received from [place] that patient is admitted to psych hospital in [place].  Social worker to follow up and in basket me when discharged home.  Tomasa Rand, RN, BSN, CEN [facility] [employer] [phone]

## 2018-04-29 ENCOUNTER — Other Ambulatory Visit: Payer: Self-pay | Admitting: *Deleted

## 2018-04-29 ENCOUNTER — Other Ambulatory Visit: Payer: Self-pay | Admitting: Neurology

## 2018-04-29 DIAGNOSIS — D75839 Thrombocytosis, unspecified: Secondary | ICD-10-CM | POA: Insufficient documentation

## 2018-04-29 DIAGNOSIS — N183 Chronic kidney disease, stage 3 unspecified: Secondary | ICD-10-CM | POA: Insufficient documentation

## 2018-04-29 DIAGNOSIS — D473 Essential (hemorrhagic) thrombocythemia: Secondary | ICD-10-CM | POA: Insufficient documentation

## 2018-04-30 MED ORDER — PIOGLITAZONE HCL 45 MG PO TABS
45.00 | ORAL_TABLET | ORAL | Status: DC
Start: 2018-05-16 — End: 2018-04-30

## 2018-04-30 MED ORDER — ACETAMINOPHEN 325 MG PO TABS
650.00 | ORAL_TABLET | ORAL | Status: DC
Start: ? — End: 2018-04-30

## 2018-04-30 MED ORDER — GENERIC EXTERNAL MEDICATION
Status: DC
Start: ? — End: 2018-04-30

## 2018-04-30 MED ORDER — KETOROLAC TROMETHAMINE 15 MG/ML IJ SOLN
15.00 | INTRAMUSCULAR | Status: DC
Start: ? — End: 2018-04-30

## 2018-04-30 MED ORDER — TRAMADOL HCL 50 MG PO TABS
50.00 | ORAL_TABLET | ORAL | Status: DC
Start: ? — End: 2018-04-30

## 2018-04-30 MED ORDER — RIVASTIGMINE 4.6 MG/24HR TD PT24
1.00 | MEDICATED_PATCH | TRANSDERMAL | Status: DC
Start: 2018-05-16 — End: 2018-04-30

## 2018-04-30 MED ORDER — ALPRAZOLAM 0.25 MG PO TABS
0.25 | ORAL_TABLET | ORAL | Status: DC
Start: 2018-05-03 — End: 2018-04-30

## 2018-04-30 MED ORDER — FLUOXETINE HCL 20 MG PO CAPS
20.00 | ORAL_CAPSULE | ORAL | Status: DC
Start: 2018-05-16 — End: 2018-04-30

## 2018-04-30 MED ORDER — LORAZEPAM 0.5 MG PO TABS
0.50 | ORAL_TABLET | ORAL | Status: DC
Start: ? — End: 2018-04-30

## 2018-04-30 MED ORDER — INSULIN GLARGINE 100 UNIT/ML ~~LOC~~ SOLN
25.00 | SUBCUTANEOUS | Status: DC
Start: 2018-04-30 — End: 2018-04-30

## 2018-04-30 MED ORDER — AMOXICILLIN-POT CLAVULANATE 400-57 MG/5ML PO SUSR
ORAL | Status: DC
Start: 2018-04-30 — End: 2018-04-30

## 2018-04-30 MED ORDER — TRAZODONE HCL 100 MG PO TABS
100.00 | ORAL_TABLET | ORAL | Status: DC
Start: 2018-05-15 — End: 2018-04-30

## 2018-04-30 NOTE — Patient Outreach (Signed)
[facility] [facility]) Care Management  04/30/2018  SHARRIEFF SPRATLIN April 18, 1934 [id number]  CSW spoke with pt's wife by phone pn 04/29/2018. She reports pt remains at [facility] and was found to have a UTI. She is uncertain of the discharge plans and concerned about taking him home. CSW discussed possible options with her (SNF rehab, ALF/,memory care) and encouraged her to call the hospital to talk with the discharge planner about dc plans and needs.  CSW offered support and will plan a follow up call next week.  Eduard Clos, MSW, [place] Worker  [place] [phone]

## 2018-05-02 ENCOUNTER — Other Ambulatory Visit: Payer: Self-pay | Admitting: Pharmacist

## 2018-05-02 NOTE — Patient Outreach (Signed)
[place] [facility]) Care Management  [place] 05/02/2018  XYLER TERPENING May 02, 1934 [id number]  Communication received from [facility] inpatient liaison that patient currently hospitalized in [place].  Will hold off on contacting patient until discharge plans known.   Ralene Bathe, PharmD, [place] [phone]

## 2018-05-03 ENCOUNTER — Other Ambulatory Visit: Payer: Self-pay | Admitting: *Deleted

## 2018-05-03 MED ORDER — LACTULOSE 10 GM/15ML PO SOLN
20.00 | ORAL | Status: DC
Start: ? — End: 2018-05-03

## 2018-05-03 MED ORDER — GENERIC EXTERNAL MEDICATION
Status: DC
Start: ? — End: 2018-05-03

## 2018-05-03 MED ORDER — QUETIAPINE FUMARATE 25 MG PO TABS
25.00 | ORAL_TABLET | ORAL | Status: DC
Start: ? — End: 2018-05-03

## 2018-05-03 MED ORDER — QUETIAPINE FUMARATE 100 MG PO TABS
100.00 | ORAL_TABLET | ORAL | Status: DC
Start: 2018-05-15 — End: 2018-05-03

## 2018-05-03 MED ORDER — INSULIN GLARGINE 100 UNIT/ML ~~LOC~~ SOLN
27.00 | SUBCUTANEOUS | Status: DC
Start: 2018-05-15 — End: 2018-05-03

## 2018-05-03 NOTE — Patient Outreach (Signed)
[facility]) Care Management  05/03/2018  Evan Vaughan 1934-12-27 [id number]     CSW spoke with wife who reports pt is still at [facility] and "slept 9 hours last night".  She said he is being treated for a UTI and also was started on Serequel. They are suggesting she not speak with him yet and she has not heard any discharge plans/conversations as of yet. Encouraged wife to contact them and inquire about dc plans and potential date.    CSW also attempted to make contact with the Social Worker at the hospital and left a message- await call back. CSW will plan a follow up call/inquiry with pt's wife.      Eduard Clos, MSW, [place] Worker  [place] [phone]

## 2018-05-04 ENCOUNTER — Other Ambulatory Visit: Payer: Self-pay | Admitting: *Deleted

## 2018-05-05 ENCOUNTER — Ambulatory Visit: Payer: Self-pay | Admitting: Pharmacist

## 2018-05-05 MED ORDER — ALPRAZOLAM 0.25 MG PO TABS
0.25 | ORAL_TABLET | ORAL | Status: DC
Start: 2018-05-06 — End: 2018-05-05

## 2018-05-05 MED ORDER — GENERIC EXTERNAL MEDICATION
Status: DC
Start: ? — End: 2018-05-05

## 2018-05-05 NOTE — Patient Outreach (Signed)
[place] [facility]) Care Management  05/05/2018  Evan Vaughan [birth year] [id number]   CSW spoke with pt's wife by phone- he remains in the hospital and she is frustrated to not be receiving updates from them. CSW encouraged her to call and ask to speak to the Charge Nurse. CSW has left a message for pt's Social Worker at hospital and awaiting call back. CSW offered support to wife and will follow up again tomorrow.   Eduard Clos, MSW, [place] Worker  [place] [phone]

## 2018-05-06 ENCOUNTER — Other Ambulatory Visit: Payer: Self-pay | Admitting: *Deleted

## 2018-05-06 MED ORDER — CLOTRIMAZOLE-BETAMETHASONE 1-0.05 % EX CREA
TOPICAL_CREAM | CUTANEOUS | Status: DC
Start: ? — End: 2018-05-06

## 2018-05-06 MED ORDER — GENERIC EXTERNAL MEDICATION
Status: DC
Start: ? — End: 2018-05-06

## 2018-05-06 MED ORDER — TAMSULOSIN HCL 0.4 MG PO CAPS
0.40 | ORAL_CAPSULE | ORAL | Status: DC
Start: 2018-05-16 — End: 2018-05-06

## 2018-05-06 NOTE — Patient Outreach (Signed)
[facility]) Care Management  05/06/2018  Evan Vaughan Aug 15, 1934 [id number]   CSW has spoken with pt's wife several times today regarding pt and his current status in [place] encouraged wife to speak to staff at the hospital to make sure they are aware she is not feeling comfortable taking him home. "he can't walk".  Per wife, she has been talking to [place] and is still awaiting IRA funds to be received which will help with paying for ALF or in home care.  CSW also attempted to reach SW at hospital but voice mail was full.    Eduard Clos, MSW, [place] Worker  [place] [phone]

## 2018-05-09 ENCOUNTER — Other Ambulatory Visit: Payer: Self-pay | Admitting: Pharmacist

## 2018-05-09 NOTE — Patient Outreach (Signed)
[facility]) Care Management [place]  05/09/2018  Evan Vaughan 1935-01-20 [id number]  Reason for referral: medication assistance  [facility] pharmacy case is being closed due to the following reasons:  Patient remains admitted to [facility] hospital >10 days.  [facility] LCSW will send new referral if / when patient discharged home.    Ralene Bathe, PharmD, [place] [phone]

## 2018-05-15 DIAGNOSIS — N401 Enlarged prostate with lower urinary tract symptoms: Secondary | ICD-10-CM | POA: Insufficient documentation

## 2018-05-15 DIAGNOSIS — R350 Frequency of micturition: Secondary | ICD-10-CM

## 2018-05-16 ENCOUNTER — Other Ambulatory Visit: Payer: Self-pay | Admitting: *Deleted

## 2018-05-16 NOTE — Patient Outreach (Signed)
[place] [facility] [facility]) Care Management  05/16/2018  Evan Vaughan [birth year] [id number]   CSW spoke with pt's wife by phone who reports pt was released from [facility] over the weekend. Pt's wife states he was released on Serequel and slept through the night. She also reports he was able to walk around the home independently and she is awaiting [place] visit to initiate in home therapy/nursing  CSW will touch base again tomorrow for updates on pt's transition home. CSW will make [facility] RNCM aware of pt's release and return to home.  Eduard Clos, MSW, LCSW Clinical Social Worker  [place], MSW, [place] Worker  [place] [phone]

## 2018-05-17 ENCOUNTER — Other Ambulatory Visit: Payer: Self-pay | Admitting: *Deleted

## 2018-05-17 DIAGNOSIS — G20 Parkinson's disease: Secondary | ICD-10-CM | POA: Diagnosis not present

## 2018-05-17 DIAGNOSIS — G309 Alzheimer's disease, unspecified: Secondary | ICD-10-CM | POA: Diagnosis not present

## 2018-05-17 DIAGNOSIS — Z48815 Encounter for surgical aftercare following surgery on the digestive system: Secondary | ICD-10-CM | POA: Diagnosis not present

## 2018-05-17 DIAGNOSIS — M549 Dorsalgia, unspecified: Secondary | ICD-10-CM | POA: Diagnosis not present

## 2018-05-17 DIAGNOSIS — R300 Dysuria: Secondary | ICD-10-CM | POA: Diagnosis not present

## 2018-05-17 DIAGNOSIS — M5136 Other intervertebral disc degeneration, lumbar region: Secondary | ICD-10-CM | POA: Diagnosis not present

## 2018-05-17 DIAGNOSIS — R350 Frequency of micturition: Secondary | ICD-10-CM | POA: Diagnosis not present

## 2018-05-17 DIAGNOSIS — I5030 Unspecified diastolic (congestive) heart failure: Secondary | ICD-10-CM | POA: Diagnosis not present

## 2018-05-17 MED ORDER — GENERIC EXTERNAL MEDICATION
Status: DC
Start: ? — End: 2018-05-17

## 2018-05-17 MED ORDER — DUPILUMAB 300 MG/2ML ~~LOC~~ SOSY
300.00 | PREFILLED_SYRINGE | SUBCUTANEOUS | Status: DC
Start: 2018-05-22 — End: 2018-05-17

## 2018-05-17 MED ORDER — ALPRAZOLAM 0.25 MG PO TABS
0.25 | ORAL_TABLET | ORAL | Status: DC
Start: 2018-05-15 — End: 2018-05-17

## 2018-05-17 MED ORDER — BISACODYL 10 MG RE SUPP
10.00 | RECTAL | Status: DC
Start: ? — End: 2018-05-17

## 2018-05-17 MED ORDER — LORAZEPAM 0.5 MG PO TABS
0.50 | ORAL_TABLET | ORAL | Status: DC
Start: ? — End: 2018-05-17

## 2018-05-17 NOTE — Patient Outreach (Signed)
[place] [facility]) Care Management  05/17/2018  SAMIK BALKCOM 06/15/1934 [id number]   CSW spoke with pt's wife who reports they did not sleep well last night; "he was up every 2 hours trying to pee". She reports he has been able to void some (has had accidents on way to the bathroom) and is somewhat concerned about an infection. Per wife, [facility] is coming today and CSW encouraged her to discuss with RN and ask about getting a urine sample and/or calling PCP. Per wife, no other needs or concerns at this time. CSW plans a f/u call to pt/wife next week.   Eduard Clos, MSW, [place] Worker  [place] [phone]

## 2018-05-18 DIAGNOSIS — I5030 Unspecified diastolic (congestive) heart failure: Secondary | ICD-10-CM | POA: Diagnosis not present

## 2018-05-18 DIAGNOSIS — M549 Dorsalgia, unspecified: Secondary | ICD-10-CM | POA: Diagnosis not present

## 2018-05-18 DIAGNOSIS — Z48815 Encounter for surgical aftercare following surgery on the digestive system: Secondary | ICD-10-CM | POA: Diagnosis not present

## 2018-05-18 DIAGNOSIS — G309 Alzheimer's disease, unspecified: Secondary | ICD-10-CM | POA: Diagnosis not present

## 2018-05-18 DIAGNOSIS — M5136 Other intervertebral disc degeneration, lumbar region: Secondary | ICD-10-CM | POA: Diagnosis not present

## 2018-05-18 DIAGNOSIS — G20 Parkinson's disease: Secondary | ICD-10-CM | POA: Diagnosis not present

## 2018-05-19 ENCOUNTER — Other Ambulatory Visit: Payer: Self-pay

## 2018-05-19 DIAGNOSIS — I5030 Unspecified diastolic (congestive) heart failure: Secondary | ICD-10-CM | POA: Diagnosis not present

## 2018-05-19 DIAGNOSIS — M549 Dorsalgia, unspecified: Secondary | ICD-10-CM | POA: Diagnosis not present

## 2018-05-19 DIAGNOSIS — Z48815 Encounter for surgical aftercare following surgery on the digestive system: Secondary | ICD-10-CM | POA: Diagnosis not present

## 2018-05-19 DIAGNOSIS — G20 Parkinson's disease: Secondary | ICD-10-CM | POA: Diagnosis not present

## 2018-05-19 DIAGNOSIS — M5136 Other intervertebral disc degeneration, lumbar region: Secondary | ICD-10-CM | POA: Diagnosis not present

## 2018-05-19 DIAGNOSIS — G309 Alzheimer's disease, unspecified: Secondary | ICD-10-CM | POA: Diagnosis not present

## 2018-05-19 NOTE — Patient Outreach (Signed)
Transition of care: Discharged from [facility] on 05/15/2018 Diagnosis: UTI and confusion  Placed call to wife to follow up on discharge. Wife states that patient is incontinent of urine and appears to be having sundownings. Reports he is much better than he was. Wife reports that [facility] is active with patient and took at urine sample. Wife states she is waiting to hear, Reports she has called MD office to get results. Wife believes patient has another UTI.  Wife reports patient is walking with walker. Reports he continues to be weak.  Wife reports that patient is eating well and has not had a falls since he came home.   PLAN: will call patient back in 1 week for follow up.  Encouraged wife to keep follow up appointment with primary MD on 05/23/2018.  Tomasa Rand, RN, BSN, CEN [facility] [employer] [phone]

## 2018-05-22 DIAGNOSIS — G20 Parkinson's disease: Secondary | ICD-10-CM | POA: Diagnosis not present

## 2018-05-22 DIAGNOSIS — Z7982 Long term (current) use of aspirin: Secondary | ICD-10-CM | POA: Diagnosis not present

## 2018-05-22 DIAGNOSIS — I5030 Unspecified diastolic (congestive) heart failure: Secondary | ICD-10-CM | POA: Diagnosis not present

## 2018-05-22 DIAGNOSIS — G309 Alzheimer's disease, unspecified: Secondary | ICD-10-CM | POA: Diagnosis not present

## 2018-05-22 DIAGNOSIS — E1169 Type 2 diabetes mellitus with other specified complication: Secondary | ICD-10-CM | POA: Diagnosis not present

## 2018-05-22 DIAGNOSIS — N183 Chronic kidney disease, stage 3 (moderate): Secondary | ICD-10-CM | POA: Diagnosis not present

## 2018-05-22 DIAGNOSIS — Z9181 History of falling: Secondary | ICD-10-CM | POA: Diagnosis not present

## 2018-05-22 DIAGNOSIS — F0281 Dementia in other diseases classified elsewhere with behavioral disturbance: Secondary | ICD-10-CM | POA: Diagnosis not present

## 2018-05-22 DIAGNOSIS — D500 Iron deficiency anemia secondary to blood loss (chronic): Secondary | ICD-10-CM | POA: Diagnosis not present

## 2018-05-22 DIAGNOSIS — M549 Dorsalgia, unspecified: Secondary | ICD-10-CM | POA: Diagnosis not present

## 2018-05-22 DIAGNOSIS — N400 Enlarged prostate without lower urinary tract symptoms: Secondary | ICD-10-CM | POA: Diagnosis not present

## 2018-05-22 DIAGNOSIS — M5136 Other intervertebral disc degeneration, lumbar region: Secondary | ICD-10-CM | POA: Diagnosis not present

## 2018-05-22 DIAGNOSIS — M1990 Unspecified osteoarthritis, unspecified site: Secondary | ICD-10-CM | POA: Diagnosis not present

## 2018-05-22 DIAGNOSIS — E782 Mixed hyperlipidemia: Secondary | ICD-10-CM | POA: Diagnosis not present

## 2018-05-22 DIAGNOSIS — F419 Anxiety disorder, unspecified: Secondary | ICD-10-CM | POA: Diagnosis not present

## 2018-05-22 DIAGNOSIS — I130 Hypertensive heart and chronic kidney disease with heart failure and stage 1 through stage 4 chronic kidney disease, or unspecified chronic kidney disease: Secondary | ICD-10-CM | POA: Diagnosis not present

## 2018-05-22 DIAGNOSIS — Z794 Long term (current) use of insulin: Secondary | ICD-10-CM | POA: Diagnosis not present

## 2018-05-22 DIAGNOSIS — E1122 Type 2 diabetes mellitus with diabetic chronic kidney disease: Secondary | ICD-10-CM | POA: Diagnosis not present

## 2018-05-22 DIAGNOSIS — E1165 Type 2 diabetes mellitus with hyperglycemia: Secondary | ICD-10-CM | POA: Diagnosis not present

## 2018-05-22 DIAGNOSIS — Z79891 Long term (current) use of opiate analgesic: Secondary | ICD-10-CM | POA: Diagnosis not present

## 2018-05-23 ENCOUNTER — Other Ambulatory Visit: Payer: Self-pay | Admitting: *Deleted

## 2018-05-23 NOTE — Patient Outreach (Addendum)
[place] [facility]) Care Management  05/23/2018  Evan Vaughan August 01, 1934 [id number]   [place] spoke with pt's wife who reports they went to see his PCP today but had the appointment time wrong; "we were late and we had to reschedule". Per wife, they will see him on Thursday. She reports things are going well at home in regards to his mobility, behaviors and she denies any concerns or needs from CSW.   CSW will plan a follow up call at the end of week for update on PCP visit and overall needs.    Eduard Clos, MSW, [place] Worker  [place] [phone]

## 2018-05-24 DIAGNOSIS — G309 Alzheimer's disease, unspecified: Secondary | ICD-10-CM | POA: Diagnosis not present

## 2018-05-24 DIAGNOSIS — M549 Dorsalgia, unspecified: Secondary | ICD-10-CM | POA: Diagnosis not present

## 2018-05-24 DIAGNOSIS — M5136 Other intervertebral disc degeneration, lumbar region: Secondary | ICD-10-CM | POA: Diagnosis not present

## 2018-05-24 DIAGNOSIS — F0281 Dementia in other diseases classified elsewhere with behavioral disturbance: Secondary | ICD-10-CM | POA: Diagnosis not present

## 2018-05-24 DIAGNOSIS — E1122 Type 2 diabetes mellitus with diabetic chronic kidney disease: Secondary | ICD-10-CM | POA: Diagnosis not present

## 2018-05-24 DIAGNOSIS — G20 Parkinson's disease: Secondary | ICD-10-CM | POA: Diagnosis not present

## 2018-05-25 ENCOUNTER — Other Ambulatory Visit: Payer: Self-pay

## 2018-05-25 DIAGNOSIS — F0281 Dementia in other diseases classified elsewhere with behavioral disturbance: Secondary | ICD-10-CM | POA: Diagnosis not present

## 2018-05-25 DIAGNOSIS — M5136 Other intervertebral disc degeneration, lumbar region: Secondary | ICD-10-CM | POA: Diagnosis not present

## 2018-05-25 DIAGNOSIS — G20 Parkinson's disease: Secondary | ICD-10-CM | POA: Diagnosis not present

## 2018-05-25 DIAGNOSIS — E1122 Type 2 diabetes mellitus with diabetic chronic kidney disease: Secondary | ICD-10-CM | POA: Diagnosis not present

## 2018-05-25 DIAGNOSIS — M549 Dorsalgia, unspecified: Secondary | ICD-10-CM | POA: Diagnosis not present

## 2018-05-25 DIAGNOSIS — G309 Alzheimer's disease, unspecified: Secondary | ICD-10-CM | POA: Diagnosis not present

## 2018-05-25 NOTE — Patient Outreach (Signed)
Transition of care:  Placed call to patient. Wife is not available and patient has a Actuary.   PLAN: will call back in 24 hours.  Tomasa Rand, RN, BSN, CEN [place] [employer] [phone]

## 2018-05-26 ENCOUNTER — Other Ambulatory Visit: Payer: Self-pay

## 2018-05-26 DIAGNOSIS — F0391 Unspecified dementia with behavioral disturbance: Secondary | ICD-10-CM | POA: Diagnosis not present

## 2018-05-26 DIAGNOSIS — N401 Enlarged prostate with lower urinary tract symptoms: Secondary | ICD-10-CM | POA: Diagnosis not present

## 2018-05-26 DIAGNOSIS — G20 Parkinson's disease: Secondary | ICD-10-CM | POA: Diagnosis not present

## 2018-05-26 DIAGNOSIS — G309 Alzheimer's disease, unspecified: Secondary | ICD-10-CM | POA: Diagnosis not present

## 2018-05-26 DIAGNOSIS — E46 Unspecified protein-calorie malnutrition: Secondary | ICD-10-CM | POA: Diagnosis not present

## 2018-05-26 DIAGNOSIS — Z6822 Body mass index (BMI) 22.0-22.9, adult: Secondary | ICD-10-CM | POA: Diagnosis not present

## 2018-05-26 DIAGNOSIS — E1165 Type 2 diabetes mellitus with hyperglycemia: Secondary | ICD-10-CM | POA: Diagnosis not present

## 2018-05-26 NOTE — Patient Outreach (Signed)
Transition of care:  Placed call to wife of patient to follow up on progress. Wife states she took him to the doctor by herself and now she is exhausted. Reports patient has lost 10-12 pounds and MD wanted patient to drink ATKINS shake to improve protein. Wife states CBG today of 150. MD recommended increased insulin from 20 to 25 units.  Wife states that patient fell in the bathroom last week. No injury. States patient was using his walker and tripped. Wife states that patient does not pick up feet very well. Continues to have PT once a week at this time.  Wife states, patient sleeps well one day and no so good the next.   Reviewed with wife the need to complete assessments via phone and she is too tired today. We agreed to complete assessments next week.  PLAN: telephone follow up in 1 week.  Tomasa Rand, RN, BSN, CEN [facility] [employer] [phone]

## 2018-05-27 ENCOUNTER — Other Ambulatory Visit: Payer: Self-pay | Admitting: *Deleted

## 2018-05-27 ENCOUNTER — Encounter: Payer: Self-pay | Admitting: *Deleted

## 2018-05-30 DIAGNOSIS — M5136 Other intervertebral disc degeneration, lumbar region: Secondary | ICD-10-CM | POA: Diagnosis not present

## 2018-05-30 DIAGNOSIS — G20 Parkinson's disease: Secondary | ICD-10-CM | POA: Diagnosis not present

## 2018-05-30 DIAGNOSIS — F0281 Dementia in other diseases classified elsewhere with behavioral disturbance: Secondary | ICD-10-CM | POA: Diagnosis not present

## 2018-05-30 DIAGNOSIS — G309 Alzheimer's disease, unspecified: Secondary | ICD-10-CM | POA: Diagnosis not present

## 2018-05-30 DIAGNOSIS — M549 Dorsalgia, unspecified: Secondary | ICD-10-CM | POA: Diagnosis not present

## 2018-05-30 DIAGNOSIS — E1122 Type 2 diabetes mellitus with diabetic chronic kidney disease: Secondary | ICD-10-CM | POA: Diagnosis not present

## 2018-05-31 ENCOUNTER — Other Ambulatory Visit: Payer: Self-pay | Admitting: *Deleted

## 2018-06-01 DIAGNOSIS — G309 Alzheimer's disease, unspecified: Secondary | ICD-10-CM | POA: Diagnosis not present

## 2018-06-01 DIAGNOSIS — G20 Parkinson's disease: Secondary | ICD-10-CM | POA: Diagnosis not present

## 2018-06-01 DIAGNOSIS — M5136 Other intervertebral disc degeneration, lumbar region: Secondary | ICD-10-CM | POA: Diagnosis not present

## 2018-06-01 DIAGNOSIS — F0281 Dementia in other diseases classified elsewhere with behavioral disturbance: Secondary | ICD-10-CM | POA: Diagnosis not present

## 2018-06-01 DIAGNOSIS — M549 Dorsalgia, unspecified: Secondary | ICD-10-CM | POA: Diagnosis not present

## 2018-06-01 DIAGNOSIS — E1122 Type 2 diabetes mellitus with diabetic chronic kidney disease: Secondary | ICD-10-CM | POA: Diagnosis not present

## 2018-06-01 NOTE — Patient Outreach (Signed)
[facility]) Care Management  06/01/2018  Evan Vaughan 08-11-1934 [id number]   CSW spoke with wife who reports things are going fairly well; "he had a small fall in the bathroom the other night". She denies any injury and states she was instructed by HHPT to walk with him every time he gets up to ambulate. "I am sitting beside him now".  She states his behaviors are better and no concerns at this time.  She continues to feed him Julien Girt Protein drinks for extra calories and nutrients.   Support offered and will plan a follow up call in 7-10 days.   Eduard Clos, MSW, [place] Worker  [place] [phone]

## 2018-06-02 ENCOUNTER — Other Ambulatory Visit: Payer: Self-pay

## 2018-06-02 DIAGNOSIS — G20 Parkinson's disease: Secondary | ICD-10-CM | POA: Diagnosis not present

## 2018-06-02 DIAGNOSIS — I5030 Unspecified diastolic (congestive) heart failure: Secondary | ICD-10-CM | POA: Diagnosis not present

## 2018-06-02 DIAGNOSIS — M549 Dorsalgia, unspecified: Secondary | ICD-10-CM | POA: Diagnosis not present

## 2018-06-02 DIAGNOSIS — M5136 Other intervertebral disc degeneration, lumbar region: Secondary | ICD-10-CM | POA: Diagnosis not present

## 2018-06-02 DIAGNOSIS — F0280 Dementia in other diseases classified elsewhere without behavioral disturbance: Secondary | ICD-10-CM | POA: Diagnosis not present

## 2018-06-02 DIAGNOSIS — G309 Alzheimer's disease, unspecified: Secondary | ICD-10-CM | POA: Diagnosis not present

## 2018-06-02 NOTE — Patient Outreach (Signed)
Transition of care:  Placed call to patient and spoke with wife. Mrs. Osterhout reports that patient has good days and bad days. Reports yesterday and last night were good. Reports day before patient was up every hour to go to the bathroom. Wife reports patient will see urologist on Monday for a procedure to help with urinary frequency and prostate issues.   Wife reports CBG of 287 yesterday. Has not yet monitored CBG today.  ( still in the bed).  Wife reports 1 fall since last week when patient fell off the toilet.  Denies  Injury.   Remain active with home health.  PLAN: will follow up with wife in 1 week. Reviewed safety precautions.   Tomasa Rand, RN, BSN, CEN [facility] [employer] [phone]

## 2018-06-06 ENCOUNTER — Other Ambulatory Visit: Payer: Self-pay | Admitting: *Deleted

## 2018-06-06 DIAGNOSIS — R351 Nocturia: Secondary | ICD-10-CM | POA: Diagnosis not present

## 2018-06-06 DIAGNOSIS — N401 Enlarged prostate with lower urinary tract symptoms: Secondary | ICD-10-CM | POA: Diagnosis not present

## 2018-06-06 DIAGNOSIS — N318 Other neuromuscular dysfunction of bladder: Secondary | ICD-10-CM | POA: Diagnosis not present

## 2018-06-06 NOTE — Patient Outreach (Signed)
[place] [facility]) Care Management  06/06/2018  Evan Vaughan Dec 22, 1934 [id number]   CSW spoke with pt's wife by phone today. She is taking him to see a urologist today- "he continues to get up all night long" (to void urine). She is hopeful that they will have some sort of treatment options to decrease the amount of bathroom visits he is needing.  She reports pt has been doing very good- they have gotten a smooth routine (aside from all the up and down to bathroom visits during the night). She has not received the Medicaid application and CSW will request one be mailed to her again.   Support offered and will plan a follow up call later in the week.   Eduard Clos, MSW, [place] Worker  [place] [phone]

## 2018-06-08 DIAGNOSIS — M5136 Other intervertebral disc degeneration, lumbar region: Secondary | ICD-10-CM | POA: Diagnosis not present

## 2018-06-08 DIAGNOSIS — G309 Alzheimer's disease, unspecified: Secondary | ICD-10-CM | POA: Diagnosis not present

## 2018-06-08 DIAGNOSIS — G20 Parkinson's disease: Secondary | ICD-10-CM | POA: Diagnosis not present

## 2018-06-08 DIAGNOSIS — I10 Essential (primary) hypertension: Secondary | ICD-10-CM | POA: Diagnosis not present

## 2018-06-08 DIAGNOSIS — Z8673 Personal history of transient ischemic attack (TIA), and cerebral infarction without residual deficits: Secondary | ICD-10-CM | POA: Diagnosis not present

## 2018-06-08 DIAGNOSIS — E1122 Type 2 diabetes mellitus with diabetic chronic kidney disease: Secondary | ICD-10-CM | POA: Diagnosis not present

## 2018-06-08 DIAGNOSIS — E119 Type 2 diabetes mellitus without complications: Secondary | ICD-10-CM | POA: Diagnosis not present

## 2018-06-08 DIAGNOSIS — F0281 Dementia in other diseases classified elsewhere with behavioral disturbance: Secondary | ICD-10-CM | POA: Diagnosis not present

## 2018-06-08 DIAGNOSIS — R0902 Hypoxemia: Secondary | ICD-10-CM | POA: Diagnosis not present

## 2018-06-08 DIAGNOSIS — R55 Syncope and collapse: Secondary | ICD-10-CM | POA: Diagnosis not present

## 2018-06-08 DIAGNOSIS — W19XXXA Unspecified fall, initial encounter: Secondary | ICD-10-CM | POA: Diagnosis not present

## 2018-06-08 DIAGNOSIS — R52 Pain, unspecified: Secondary | ICD-10-CM | POA: Diagnosis not present

## 2018-06-08 DIAGNOSIS — M549 Dorsalgia, unspecified: Secondary | ICD-10-CM | POA: Diagnosis not present

## 2018-06-08 DIAGNOSIS — R404 Transient alteration of awareness: Secondary | ICD-10-CM | POA: Diagnosis not present

## 2018-06-09 ENCOUNTER — Other Ambulatory Visit: Payer: Self-pay

## 2018-06-09 DIAGNOSIS — G309 Alzheimer's disease, unspecified: Secondary | ICD-10-CM | POA: Diagnosis not present

## 2018-06-09 DIAGNOSIS — M549 Dorsalgia, unspecified: Secondary | ICD-10-CM | POA: Diagnosis not present

## 2018-06-09 DIAGNOSIS — M5136 Other intervertebral disc degeneration, lumbar region: Secondary | ICD-10-CM | POA: Diagnosis not present

## 2018-06-09 DIAGNOSIS — E1122 Type 2 diabetes mellitus with diabetic chronic kidney disease: Secondary | ICD-10-CM | POA: Diagnosis not present

## 2018-06-09 DIAGNOSIS — G20 Parkinson's disease: Secondary | ICD-10-CM | POA: Diagnosis not present

## 2018-06-09 DIAGNOSIS — F0281 Dementia in other diseases classified elsewhere with behavioral disturbance: Secondary | ICD-10-CM | POA: Diagnosis not present

## 2018-06-09 NOTE — Patient Outreach (Signed)
Transition of care:  Placed call to patient for weekly follow up. Wife reports patient saw urologist on Monday and was told bladder and prostate look good. Was taken off flomax and place on another "smooth muscle relaxer per wife.  Unknown name of medication. Wife states patient took this medication Monday and Tuesday night. Reports patient became very weak and unable to walk.  Yolanda Bonine was unable to get patient up. EMS was called last night and patient was taken to the emergency department. Wife states he was checked out and discharged home and it was determine to be the new medications.   Wife sound exhausted on the phone.  PLAN: placed call to [facility] social worker and reviewed new events. Encouraged wife to call urology office and inform of events.  Offered support for wife. This update sent to MD.   Tomasa Rand, RN, BSN, CEN [place] Coordinator [phone]

## 2018-06-10 ENCOUNTER — Other Ambulatory Visit: Payer: Self-pay | Admitting: *Deleted

## 2018-06-10 NOTE — Patient Outreach (Signed)
[place]van Vaughan) Care Management  06/10/2018  Evan Vaughan October 02, 1934 [id number]   CSW spoke with pt's son who happened to be at pt's house while wife is out running errands. "he is having a really good day". Pt's son, [name], shared that pt went to see Urologist on Monday and had some medication changes; including an ABX he thinks. He states pt then was not doing well and went to the ER mid week and they changed the ABX and now is doing good. "his bladder and prostate are fine and no need for surgery".   CSW told son to have pt's wife call me back if she wants or needs today and I plan follow up next week again.   Eduard Clos, MSW, [place] Worker  [place] [phone]

## 2018-06-13 DIAGNOSIS — M5136 Other intervertebral disc degeneration, lumbar region: Secondary | ICD-10-CM | POA: Diagnosis not present

## 2018-06-13 DIAGNOSIS — G20 Parkinson's disease: Secondary | ICD-10-CM | POA: Diagnosis not present

## 2018-06-13 DIAGNOSIS — E1122 Type 2 diabetes mellitus with diabetic chronic kidney disease: Secondary | ICD-10-CM | POA: Diagnosis not present

## 2018-06-13 DIAGNOSIS — F0281 Dementia in other diseases classified elsewhere with behavioral disturbance: Secondary | ICD-10-CM | POA: Diagnosis not present

## 2018-06-13 DIAGNOSIS — G309 Alzheimer's disease, unspecified: Secondary | ICD-10-CM | POA: Diagnosis not present

## 2018-06-13 DIAGNOSIS — M549 Dorsalgia, unspecified: Secondary | ICD-10-CM | POA: Diagnosis not present

## 2018-06-14 ENCOUNTER — Other Ambulatory Visit: Payer: Self-pay | Admitting: *Deleted

## 2018-06-15 NOTE — Patient Outreach (Signed)
[place] [facility]) Care Management  06/15/2018  JASSIEL FLYE 1934/12/31 [id number]   CSW spoke with wife by phone yesterday who reports he is "worn out and sleeping". She thinks the medicine is helping and feels things have calmed down a lot. Pt's wife would like to connect with support and CSW discussed possible support groups; local and online and she plans to look into online options for now due to the COVID restrictions. She was able to go spend Mother's Day with her children and pt's son came and stayed with pt. " I so enjoyed my time away". CSW validated her need for get aways and self care and for help in caring for pt. She hopes to get the son to come again this weekend also.  Emotional support and encouragement offered.  CSW will update [facility] RNCM and plan a follow up call in the next 2 weeks.    Eduard Clos, MSW, [place] Worker  [place] [phone]

## 2018-06-16 DIAGNOSIS — M5136 Other intervertebral disc degeneration, lumbar region: Secondary | ICD-10-CM | POA: Diagnosis not present

## 2018-06-16 DIAGNOSIS — M549 Dorsalgia, unspecified: Secondary | ICD-10-CM | POA: Diagnosis not present

## 2018-06-16 DIAGNOSIS — G20 Parkinson's disease: Secondary | ICD-10-CM | POA: Diagnosis not present

## 2018-06-16 DIAGNOSIS — E1122 Type 2 diabetes mellitus with diabetic chronic kidney disease: Secondary | ICD-10-CM | POA: Diagnosis not present

## 2018-06-16 DIAGNOSIS — G309 Alzheimer's disease, unspecified: Secondary | ICD-10-CM | POA: Diagnosis not present

## 2018-06-16 DIAGNOSIS — F0281 Dementia in other diseases classified elsewhere with behavioral disturbance: Secondary | ICD-10-CM | POA: Diagnosis not present

## 2018-06-17 ENCOUNTER — Other Ambulatory Visit: Payer: Self-pay

## 2018-06-20 DIAGNOSIS — R531 Weakness: Secondary | ICD-10-CM | POA: Diagnosis not present

## 2018-06-20 DIAGNOSIS — G20 Parkinson's disease: Secondary | ICD-10-CM | POA: Diagnosis not present

## 2018-06-20 DIAGNOSIS — M5136 Other intervertebral disc degeneration, lumbar region: Secondary | ICD-10-CM | POA: Diagnosis not present

## 2018-06-20 DIAGNOSIS — F0281 Dementia in other diseases classified elsewhere with behavioral disturbance: Secondary | ICD-10-CM | POA: Diagnosis not present

## 2018-06-20 DIAGNOSIS — M549 Dorsalgia, unspecified: Secondary | ICD-10-CM | POA: Diagnosis not present

## 2018-06-20 DIAGNOSIS — G309 Alzheimer's disease, unspecified: Secondary | ICD-10-CM | POA: Diagnosis not present

## 2018-06-20 DIAGNOSIS — E1122 Type 2 diabetes mellitus with diabetic chronic kidney disease: Secondary | ICD-10-CM | POA: Diagnosis not present

## 2018-06-20 DIAGNOSIS — R350 Frequency of micturition: Secondary | ICD-10-CM | POA: Diagnosis not present

## 2018-06-20 DIAGNOSIS — Z6822 Body mass index (BMI) 22.0-22.9, adult: Secondary | ICD-10-CM | POA: Diagnosis not present

## 2018-06-20 NOTE — Patient Outreach (Signed)
Late entry from 06/17/2018  Telephone assessment/ case closure for nursing:  Placed call to patient and spoke with wife. Wife reports patient is doing well. Reports sleeping better. Off meds for prostate and wife reports patient is urinating well.  Denies any new falls at this time.  Cbg today 100.  Patient has follow up planned with primary MD on 06/20/2018.  PLAN: completed transition of care successfully. No new needs. Remains active with [place] Health Education officer, museum.  Will close to nursing.   Tomasa Rand, RN, BSN, CEN [facility] [employer] [phone]

## 2018-06-21 ENCOUNTER — Other Ambulatory Visit: Payer: Self-pay

## 2018-06-21 ENCOUNTER — Telehealth (INDEPENDENT_AMBULATORY_CARE_PROVIDER_SITE_OTHER): Payer: Medicare Other | Admitting: Neurology

## 2018-06-21 VITALS — BP 130/60 | Ht 73.0 in | Wt 170.0 lb

## 2018-06-21 DIAGNOSIS — I130 Hypertensive heart and chronic kidney disease with heart failure and stage 1 through stage 4 chronic kidney disease, or unspecified chronic kidney disease: Secondary | ICD-10-CM | POA: Diagnosis not present

## 2018-06-21 DIAGNOSIS — D500 Iron deficiency anemia secondary to blood loss (chronic): Secondary | ICD-10-CM | POA: Diagnosis not present

## 2018-06-21 DIAGNOSIS — E1122 Type 2 diabetes mellitus with diabetic chronic kidney disease: Secondary | ICD-10-CM | POA: Diagnosis not present

## 2018-06-21 DIAGNOSIS — F0391 Unspecified dementia with behavioral disturbance: Secondary | ICD-10-CM

## 2018-06-21 DIAGNOSIS — G20 Parkinson's disease: Secondary | ICD-10-CM

## 2018-06-21 DIAGNOSIS — Z7982 Long term (current) use of aspirin: Secondary | ICD-10-CM | POA: Diagnosis not present

## 2018-06-21 DIAGNOSIS — M5136 Other intervertebral disc degeneration, lumbar region: Secondary | ICD-10-CM | POA: Diagnosis not present

## 2018-06-21 DIAGNOSIS — Z79891 Long term (current) use of opiate analgesic: Secondary | ICD-10-CM | POA: Diagnosis not present

## 2018-06-21 DIAGNOSIS — Z9181 History of falling: Secondary | ICD-10-CM | POA: Diagnosis not present

## 2018-06-21 DIAGNOSIS — M549 Dorsalgia, unspecified: Secondary | ICD-10-CM | POA: Diagnosis not present

## 2018-06-21 DIAGNOSIS — N183 Chronic kidney disease, stage 3 (moderate): Secondary | ICD-10-CM | POA: Diagnosis not present

## 2018-06-21 DIAGNOSIS — E1165 Type 2 diabetes mellitus with hyperglycemia: Secondary | ICD-10-CM | POA: Diagnosis not present

## 2018-06-21 DIAGNOSIS — Z794 Long term (current) use of insulin: Secondary | ICD-10-CM | POA: Diagnosis not present

## 2018-06-21 DIAGNOSIS — E782 Mixed hyperlipidemia: Secondary | ICD-10-CM | POA: Diagnosis not present

## 2018-06-21 DIAGNOSIS — I5030 Unspecified diastolic (congestive) heart failure: Secondary | ICD-10-CM | POA: Diagnosis not present

## 2018-06-21 DIAGNOSIS — E1169 Type 2 diabetes mellitus with other specified complication: Secondary | ICD-10-CM | POA: Diagnosis not present

## 2018-06-21 DIAGNOSIS — G20A1 Parkinson's disease without dyskinesia, without mention of fluctuations: Secondary | ICD-10-CM

## 2018-06-21 DIAGNOSIS — N400 Enlarged prostate without lower urinary tract symptoms: Secondary | ICD-10-CM | POA: Diagnosis not present

## 2018-06-21 DIAGNOSIS — F0281 Dementia in other diseases classified elsewhere with behavioral disturbance: Secondary | ICD-10-CM | POA: Diagnosis not present

## 2018-06-21 DIAGNOSIS — F419 Anxiety disorder, unspecified: Secondary | ICD-10-CM | POA: Diagnosis not present

## 2018-06-21 DIAGNOSIS — G309 Alzheimer's disease, unspecified: Secondary | ICD-10-CM | POA: Diagnosis not present

## 2018-06-21 DIAGNOSIS — F03B18 Unspecified dementia, moderate, with other behavioral disturbance: Secondary | ICD-10-CM

## 2018-06-21 DIAGNOSIS — M1990 Unspecified osteoarthritis, unspecified site: Secondary | ICD-10-CM | POA: Diagnosis not present

## 2018-06-21 NOTE — Progress Notes (Signed)
Virtual Visit via Telephone Note The purpose of this virtual visit is to provide medical care while limiting exposure to the novel coronavirus.    Consent was obtained for phone visit:  Yes.   Answered questions that patient had about telehealth interaction:  Yes.   I discussed the limitations, risks, security and privacy concerns of performing an evaluation and management service by telephone. I also discussed with the patient that there may be a patient responsible charge related to this service. The patient expressed understanding and agreed to proceed.  Pt location: Home Physician Location: office Name of referring provider:  Ernestene Kiel, MD I connected with .Arby Barrette at patient/wife's initiation/request on 06/21/2018 at  3:00 PM EDT by telephone and verified that I am speaking with the correct person using two identifiers.  Pt MRN:  [id number] Pt DOB:  05-Jan-1935   History of Present Illness:  The patient was last seen in September 2019 for Parkinson's disease and dementia. His wife Joycelyn Schmid is present during the phone visit and provides information since he has had progressive decline with dementia and is a poor historian. Since his last visit, he has been to the hospital twice in March for aggressive behavior, and was admitted to [facility] inpatient Psychiatry for 20 days where he was discharged on Seroquel 100mg  BID and Trazodone 100mg  qhs which his wife reports has helped significantly. He is doing good right now. No hallucinations or paranoia. She has home health coming once a week for PT, a CNA coming once a week to give him a bath. Hiring 24/7 help has been cost-prohibitive and she is not ready for him to move to higher level of care. He had been having frequent falls and several of his medications have been adjusted. His wife feels that a lot of his falls were occurring due to his prostate medication which he has stopped. She states he is not falling very much  lately. His Sinemet was also reduced to 1 tab in AM, 1/2 tab in PM. She feels this low dose may be helping with his mobility. He is on the Rivastigmine patch with no side effects. No hallucinations or paranoia. He has a walker that he does not use. He wears adult diapers, she helps dress him. Wife manages finances and medications. He does not drive.   HPI 11/18/2016: This is an 84 yo RH man with a history of diabetes, hypertension, prior stroke, dementia, who presented for evaluation of gait and memory issues. His wife provides majority of history. He feels his memory and walking are pretty good. He states he used to walk a lot more but just gets slack on it, and denies any weaknes in his legs. He feels his memory is pretty good, he denies getting lost driving, but his wife reports she does most of the driving. His wife is in charge of medications and finances. He lives with his wife and grandson, he is independent with dressing and bathing. His wife started noticing memory changes around 1.5 years ago, he was previously prescribed Aricept and Namenda, which caused side effects. He has been on the Exelon patch for the past 6-12 months, which has helped quite a bit. Dose was increased because of mood issues ("he was angry"), which helped. They deny hallucinations, but he has a lot of "crazy dreams." No REM behavior disorder, but he talks a lot and is restless in his sleep. His wife started noticing gait changes around 1-2 years ago, she reports he  drags his feet, shuffling and sliding feet. He has fallen several times, one time 3-4 months ago he fell against the commode and knocked off the screws. His last fall was just this week, he got up at night and fell. His wife reports his legs are weak, he used to walk 1 mile three times a week until a year ago. Now after walking 20 minutes, he falls into his chair and is worn out. He has had a tremor for the past few months. He denies any headaches, dizziness, diplopia,  dysarthria/dysphagia, neck pain, bowel/bladder dysfunction, focal numbness/tingling, anosmia. He has chronic back pain.   Records from his prior neurologist at [facility] were reviewed. He was initially seen in July 2017, previous to this he was followed at [facility]. 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 He started repeating himself. He had an MRI brain around 09/2014 which per documentation showed a progressive increase in biventricular diameter since 2007 without accompanying significant cortical atrophy. There was also a remote right occipital infarct with slight volume loss and gliosis. In 10/2014, he had an LP which was originally intended to be high volume to assess for NPH, but per documentation was only able to obtain 18mL of CSF (uncertain why) with an opening pressure of 13.5 cm H2O, reported to have elevated protein. He worked until June 2017.  He was noted to have a mild right>left UE tremor. He had a repeat MRI brain 08/2015 which did not show any acute changes. THere was scattered bilateral patchy areas of periventricular and subcortical white matter signal, likely representing chronic microvascular changes. This is associated with mils cerebral volume atrophy and ex vacuo dilation of the ventricles. There were small remote infarcts in the right posterior frontal lobe and right occipital lobe. Family reported worsening balance on his last visit in November 2017. They reported several falls. His prior neurologist tried to taper off Xanax, but family restarted it due to increased agitation primarily at night.  Laboratory Data: 09/2014: TSH and B12, folate normal. RPR nonreactive  Records and images were personally reviewed where available. 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 There were multilevel degenerative cervical spinal  stenosis with up to moderate spinal cord mass effect at C3-4 through C6-7, no myelomalacia seen. There was widespread moderate and severe multifactorial cervical neural foraminal stenosis, sparing only the right C3 nerve level. MRI lumbar spine showed advanced degeneration at L4-5 progressed since 2012 with moderate to severe spinal and right lateral recess stenosis, moderate to severe right L4 foraminal stenosis, progressed lumbar disc degeneration at L2-3, L3-4, stable chronic degeneration at L5-S1. Findings were discussed with his wife on the phone and he was referred for PT for gait instability. They were unable to do this then he was admitted at [facility] last 02/09/17 for confusion and right-sided weakness. Symptoms mostly resolved on arrival to the ER. He had a fever of 102 and was started on antibiotic. He had an MRI brain without contrast which I personally reviewed, no acute changes seen. It was unclear if episode was a TIA, it was recommended to continue aspirin and aggressive risk factor modification. His glucose was elevated, HbA1c was 8.4. He did PT with [facility] after the stoke. His wife reports he did not qualify with Stay Well after.    Observations/Objective:  Patient is awake, answers questions mostly  with "I don't know," oriented to person and knows he is at home. Decreased fluency.   Montreal Cognitive Assessment  06/22/2018 (MOCA-Blind done over phone)  Visuospatial/ Executive (0/5) -  Naming (0/3) -  Attention: Read list of digits (0/2) 0  Attention: Read list of letters (0/1) 0  Attention: Serial 7 subtraction starting at 100 (0/3) 1  Language: Repeat phrase (0/2) 0  Language : Fluency (0/1) 0  Abstraction (0/2) 0  Delayed Recall (0/5) 0  Orientation (0/6) 1  Total 2  Adjusted Score (based on education) 3/22    Assessment and Plan:   This is an 84 yo RH right-handed man with a history of diabetes, hypertension, prior stroke, Parkinson's disease dementia with behavioral changes.  There is continued decline, MOCA Blind (done over phone) today 3/22 (MMSE 21/30 in March 2019). Continue Rivastigmine patch, refills sent. He was having more frequent falls, likely multifactorial from medication effect, spinal stenosis, and Parkinson's disease. Falls have improved some with adjustment in medications. His Sinemet dose had been reduced to a very low dose, I am unsure if this is providing any benefit at this point, but his wife would like to continue current dose and re-evaluate on his next visit. Behavioral changes better controlled on current regimen of Seroquel and Trazodone. Continue 24/7 care, support provided to his wife, we discussed planning for higher level of care when she starts having more difficulties doing it alone at home. Follow-up in 6 months, wife knows to call for any changes.    Follow Up Instructions:   -I discussed the assessment and treatment plan with the patient's wife. The patient's wife was provided an opportunity to ask questions and all were answered. The patient's wife agreed with the plan and demonstrated an understanding of the instructions.   The patient's wife was advised to call back or seek an in-person evaluation if the symptoms worsen or if the condition fails to improve as anticipated.    Total Time spent in visit with the patient was 25 minutes, of which 100% of the time was spent in counseling and/or coordinating care on the above.   Pt's wife understands and agrees with the plan of care outlined.     Cameron Sprang, MD

## 2018-06-22 DIAGNOSIS — G20 Parkinson's disease: Secondary | ICD-10-CM | POA: Diagnosis not present

## 2018-06-22 DIAGNOSIS — G309 Alzheimer's disease, unspecified: Secondary | ICD-10-CM | POA: Diagnosis not present

## 2018-06-22 DIAGNOSIS — I130 Hypertensive heart and chronic kidney disease with heart failure and stage 1 through stage 4 chronic kidney disease, or unspecified chronic kidney disease: Secondary | ICD-10-CM | POA: Diagnosis not present

## 2018-06-22 DIAGNOSIS — E1122 Type 2 diabetes mellitus with diabetic chronic kidney disease: Secondary | ICD-10-CM | POA: Diagnosis not present

## 2018-06-22 DIAGNOSIS — M5136 Other intervertebral disc degeneration, lumbar region: Secondary | ICD-10-CM | POA: Diagnosis not present

## 2018-06-22 DIAGNOSIS — F0281 Dementia in other diseases classified elsewhere with behavioral disturbance: Secondary | ICD-10-CM | POA: Diagnosis not present

## 2018-06-22 DIAGNOSIS — M549 Dorsalgia, unspecified: Secondary | ICD-10-CM | POA: Diagnosis not present

## 2018-06-22 DIAGNOSIS — I5030 Unspecified diastolic (congestive) heart failure: Secondary | ICD-10-CM | POA: Diagnosis not present

## 2018-06-22 MED ORDER — CARBIDOPA-LEVODOPA 25-100 MG PO TABS: ORAL_TABLET | ORAL | 3 refills | Status: AC

## 2018-06-22 MED ORDER — RIVASTIGMINE 13.3 MG/24HR TD PT24: 13.3000 mg | MEDICATED_PATCH | Freq: Every day | TRANSDERMAL | 3 refills | Status: AC

## 2018-06-23 ENCOUNTER — Encounter

## 2018-06-23 ENCOUNTER — Ambulatory Visit: Payer: Medicare Other | Admitting: Neurology

## 2018-06-24 ENCOUNTER — Other Ambulatory Visit: Payer: Self-pay | Admitting: *Deleted

## 2018-06-24 NOTE — Patient Outreach (Signed)
[place] [facility]) Care Management  06/24/2018  MANVIR PRABHU Jan 21, 1935 [id number]   CSW spoke with pt's wife who reports he is doing well. His behaviors are much better- she has had additional family support and had some breaks where she has been able to run errands, rest, etc. CSW encouraged her to continue to seek and accept help. She also has been searching online for support services related to caregiver support/dealing with loved one with dementia, etc and has found some good outlets.  CSW offered support and will plan to follow up with pt and wife in the next 2 weeks.  Eduard Clos, MSW, LCSW Clinical Social Worker  [place], MSW, [place] Worker  [place] [phone]

## 2018-06-29 DIAGNOSIS — F0281 Dementia in other diseases classified elsewhere with behavioral disturbance: Secondary | ICD-10-CM | POA: Diagnosis not present

## 2018-06-29 DIAGNOSIS — M5136 Other intervertebral disc degeneration, lumbar region: Secondary | ICD-10-CM | POA: Diagnosis not present

## 2018-06-29 DIAGNOSIS — M549 Dorsalgia, unspecified: Secondary | ICD-10-CM | POA: Diagnosis not present

## 2018-06-29 DIAGNOSIS — G309 Alzheimer's disease, unspecified: Secondary | ICD-10-CM | POA: Diagnosis not present

## 2018-06-29 DIAGNOSIS — G20 Parkinson's disease: Secondary | ICD-10-CM | POA: Diagnosis not present

## 2018-06-29 DIAGNOSIS — E1122 Type 2 diabetes mellitus with diabetic chronic kidney disease: Secondary | ICD-10-CM | POA: Diagnosis not present

## 2018-06-30 DIAGNOSIS — Z6822 Body mass index (BMI) 22.0-22.9, adult: Secondary | ICD-10-CM | POA: Diagnosis not present

## 2018-06-30 DIAGNOSIS — E1165 Type 2 diabetes mellitus with hyperglycemia: Secondary | ICD-10-CM | POA: Diagnosis not present

## 2018-06-30 DIAGNOSIS — G309 Alzheimer's disease, unspecified: Secondary | ICD-10-CM | POA: Diagnosis not present

## 2018-06-30 DIAGNOSIS — G20 Parkinson's disease: Secondary | ICD-10-CM | POA: Diagnosis not present

## 2018-06-30 DIAGNOSIS — R350 Frequency of micturition: Secondary | ICD-10-CM | POA: Diagnosis not present

## 2018-07-04 DIAGNOSIS — G309 Alzheimer's disease, unspecified: Secondary | ICD-10-CM | POA: Diagnosis not present

## 2018-07-04 DIAGNOSIS — E1122 Type 2 diabetes mellitus with diabetic chronic kidney disease: Secondary | ICD-10-CM | POA: Diagnosis not present

## 2018-07-04 DIAGNOSIS — M5136 Other intervertebral disc degeneration, lumbar region: Secondary | ICD-10-CM | POA: Diagnosis not present

## 2018-07-04 DIAGNOSIS — G20 Parkinson's disease: Secondary | ICD-10-CM | POA: Diagnosis not present

## 2018-07-04 DIAGNOSIS — F0281 Dementia in other diseases classified elsewhere with behavioral disturbance: Secondary | ICD-10-CM | POA: Diagnosis not present

## 2018-07-04 DIAGNOSIS — M549 Dorsalgia, unspecified: Secondary | ICD-10-CM | POA: Diagnosis not present

## 2018-07-06 ENCOUNTER — Other Ambulatory Visit: Payer: Self-pay | Admitting: *Deleted

## 2018-07-06 NOTE — Patient Outreach (Signed)
[place] [facility]) Care Management  07/06/2018  Evan Vaughan 16-Nov-1934 [id number]  CSW spoke with pt's wife by phone who reports he has 2 falls but is ok. "He forgets to use his walker". CSW discussed possible options to consider including a bed alarm or geri-chair.  Pt's wife reports some needs related to pt's Lantus- CSW will ask [facility] to reach out to her.  She continues to have family support and is considering using Home Instead for some caregiver support.    CSW offered support and a listening ear to wife- she is appreciative of all the care/support.  CSW will plan a follow up call in 1-2 weeks.  Eduard Clos, MSW, [place] Worker  [place] [phone]

## 2018-07-08 ENCOUNTER — Other Ambulatory Visit: Payer: Self-pay | Admitting: Pharmacist

## 2018-07-08 ENCOUNTER — Ambulatory Visit: Payer: Self-pay | Admitting: Pharmacist

## 2018-07-08 ENCOUNTER — Other Ambulatory Visit: Payer: Self-pay | Admitting: Pharmacy Technician

## 2018-07-08 DIAGNOSIS — D62 Acute posthemorrhagic anemia: Secondary | ICD-10-CM | POA: Diagnosis not present

## 2018-07-08 DIAGNOSIS — Z4789 Encounter for other orthopedic aftercare: Secondary | ICD-10-CM | POA: Diagnosis not present

## 2018-07-08 DIAGNOSIS — S72091A Other fracture of head and neck of right femur, initial encounter for closed fracture: Secondary | ICD-10-CM | POA: Diagnosis not present

## 2018-07-08 DIAGNOSIS — S72051D Unspecified fracture of head of right femur, subsequent encounter for closed fracture with routine healing: Secondary | ICD-10-CM | POA: Diagnosis not present

## 2018-07-08 DIAGNOSIS — M25561 Pain in right knee: Secondary | ICD-10-CM | POA: Diagnosis not present

## 2018-07-08 DIAGNOSIS — Z7401 Bed confinement status: Secondary | ICD-10-CM | POA: Diagnosis not present

## 2018-07-08 DIAGNOSIS — Z96641 Presence of right artificial hip joint: Secondary | ICD-10-CM | POA: Diagnosis not present

## 2018-07-08 DIAGNOSIS — F0280 Dementia in other diseases classified elsewhere without behavioral disturbance: Secondary | ICD-10-CM | POA: Diagnosis present

## 2018-07-08 DIAGNOSIS — M6281 Muscle weakness (generalized): Secondary | ICD-10-CM | POA: Diagnosis not present

## 2018-07-08 DIAGNOSIS — F0390 Unspecified dementia without behavioral disturbance: Secondary | ICD-10-CM | POA: Diagnosis present

## 2018-07-08 DIAGNOSIS — M810 Age-related osteoporosis without current pathological fracture: Secondary | ICD-10-CM | POA: Diagnosis present

## 2018-07-08 DIAGNOSIS — Z20828 Contact with and (suspected) exposure to other viral communicable diseases: Secondary | ICD-10-CM | POA: Diagnosis not present

## 2018-07-08 DIAGNOSIS — R1312 Dysphagia, oropharyngeal phase: Secondary | ICD-10-CM | POA: Diagnosis not present

## 2018-07-08 DIAGNOSIS — E119 Type 2 diabetes mellitus without complications: Secondary | ICD-10-CM | POA: Diagnosis not present

## 2018-07-08 DIAGNOSIS — M84651A Pathological fracture in other disease, right femur, initial encounter for fracture: Secondary | ICD-10-CM | POA: Diagnosis not present

## 2018-07-08 DIAGNOSIS — S72011A Unspecified intracapsular fracture of right femur, initial encounter for closed fracture: Secondary | ICD-10-CM | POA: Diagnosis not present

## 2018-07-08 DIAGNOSIS — D649 Anemia, unspecified: Secondary | ICD-10-CM | POA: Diagnosis not present

## 2018-07-08 DIAGNOSIS — S8991XA Unspecified injury of right lower leg, initial encounter: Secondary | ICD-10-CM | POA: Diagnosis not present

## 2018-07-08 DIAGNOSIS — R2681 Unsteadiness on feet: Secondary | ICD-10-CM | POA: Diagnosis not present

## 2018-07-08 DIAGNOSIS — Z794 Long term (current) use of insulin: Secondary | ICD-10-CM | POA: Diagnosis not present

## 2018-07-08 DIAGNOSIS — G20 Parkinson's disease: Secondary | ICD-10-CM | POA: Diagnosis present

## 2018-07-08 DIAGNOSIS — K219 Gastro-esophageal reflux disease without esophagitis: Secondary | ICD-10-CM | POA: Diagnosis present

## 2018-07-08 DIAGNOSIS — S299XXA Unspecified injury of thorax, initial encounter: Secondary | ICD-10-CM | POA: Diagnosis not present

## 2018-07-08 DIAGNOSIS — R52 Pain, unspecified: Secondary | ICD-10-CM | POA: Diagnosis not present

## 2018-07-08 DIAGNOSIS — D6489 Other specified anemias: Secondary | ICD-10-CM | POA: Diagnosis present

## 2018-07-08 DIAGNOSIS — E785 Hyperlipidemia, unspecified: Secondary | ICD-10-CM | POA: Diagnosis not present

## 2018-07-08 DIAGNOSIS — E7800 Pure hypercholesterolemia, unspecified: Secondary | ICD-10-CM | POA: Diagnosis not present

## 2018-07-08 DIAGNOSIS — Z7982 Long term (current) use of aspirin: Secondary | ICD-10-CM | POA: Diagnosis not present

## 2018-07-08 DIAGNOSIS — W19XXXA Unspecified fall, initial encounter: Secondary | ICD-10-CM | POA: Diagnosis not present

## 2018-07-08 DIAGNOSIS — S72009A Fracture of unspecified part of neck of unspecified femur, initial encounter for closed fracture: Secondary | ICD-10-CM | POA: Diagnosis not present

## 2018-07-08 DIAGNOSIS — Z8673 Personal history of transient ischemic attack (TIA), and cerebral infarction without residual deficits: Secondary | ICD-10-CM | POA: Diagnosis not present

## 2018-07-08 DIAGNOSIS — I10 Essential (primary) hypertension: Secondary | ICD-10-CM | POA: Diagnosis not present

## 2018-07-08 DIAGNOSIS — M2550 Pain in unspecified joint: Secondary | ICD-10-CM | POA: Diagnosis not present

## 2018-07-08 DIAGNOSIS — S72001A Fracture of unspecified part of neck of right femur, initial encounter for closed fracture: Secondary | ICD-10-CM | POA: Diagnosis not present

## 2018-07-08 DIAGNOSIS — E559 Vitamin D deficiency, unspecified: Secondary | ICD-10-CM | POA: Diagnosis not present

## 2018-07-08 DIAGNOSIS — Z471 Aftercare following joint replacement surgery: Secondary | ICD-10-CM | POA: Diagnosis not present

## 2018-07-08 DIAGNOSIS — Z79899 Other long term (current) drug therapy: Secondary | ICD-10-CM | POA: Diagnosis not present

## 2018-07-08 DIAGNOSIS — S72041A Displaced fracture of base of neck of right femur, initial encounter for closed fracture: Secondary | ICD-10-CM | POA: Diagnosis not present

## 2018-07-08 DIAGNOSIS — M1611 Unilateral primary osteoarthritis, right hip: Secondary | ICD-10-CM | POA: Diagnosis not present

## 2018-07-08 DIAGNOSIS — S0990XA Unspecified injury of head, initial encounter: Secondary | ICD-10-CM | POA: Diagnosis not present

## 2018-07-08 NOTE — Patient Outreach (Signed)
[facility]) Care Management  [place]   07/08/2018  Evan Vaughan 1934/02/08 [id number]  Reason for referral: Medication Assistance  Referral source: [facility] Social Worker Current insurance: [employer]   PMHx includes but not limited to:  Parkinson's Disease, dementia, HTN, prior stroke, T2DM, eczema  Outreach:  Successful telephone call with patient's spouse.  HIPAA identifiers verified. Spouse reports that patient has had 3 falls this week, and he is currently in the ED after most recent fall.    Subjective:  Spouse reports patient unable to pay for Lantus this month as copay was > $200.  Provider changed insulin to [place] which still was expensive with cpoay ~ $60.  She reports dose is the same (25 units BID) however if patient's CBGs < 130, she does not give insulin.    Objective:  Lab Results  Component Value Date   CREATININE 1.26 (H) 02/11/2017   CREATININE 1.47 (H) 02/10/2017   CREATININE 1.30 (H) 02/09/2017    Lab Results  Component Value Date   HGBA1C 8.4 (H) 02/10/2017    Lipid Panel     Component Value Date/Time   CHOL 165 02/11/2017 0700   TRIG 86 02/11/2017 0700   HDL 60 02/11/2017 0700   CHOLHDL 2.8 02/11/2017 0700   VLDL 17 02/11/2017 0700   LDLCALC 88 02/11/2017 0700    BP Readings from Last 3 Encounters:  06/21/18 130/60  10/29/17 122/68  04/26/17 126/64    No Known Allergies  Medications Reviewed Today    Reviewed by Cameron Sprang, MD (Physician) on 06/22/18 at 1018  Med List Status: <None>  Medication Order Taking? Sig Documenting Provider Last Dose Status Informant  ALPRAZolam (XANAX) 0.25 MG tablet [id number] Yes Take 0.25 mg by mouth at bedtime. Takes half tablet in the am and 1 full tablet in the PM [provider] Taking Active Spouse/Significant Other  aspirin EC 81 MG tablet [id number] Yes Take 81 mg by mouth daily. [provider] Taking Active Spouse/Significant  Other  carbidopa-levodopa (SINEMET IR) 25-100 MG tablet [id number] Yes Take 1.5 tablets before breakfast and 1.5 tablets before DINNER as directed Cameron Sprang, MD Taking Active   clotrimazole-betamethasone (LOTRISONE) cream [id number] No Apply topically. [provider] Not Taking Active   DUPIXENT 300 MG/2ML SOSY [id number] Yes Take one (1) inject as directed every two (2) weeks. [provider] Taking Active Spouse/Significant Other  FLUoxetine (PROZAC) 40 MG capsule [id number] Yes Take 40 mg by mouth at bedtime.  [provider] Taking Active Spouse/Significant Other  hydrOXYzine (ATARAX/VISTARIL) 50 MG tablet [id number] Yes Take 50 mg by mouth at bedtime as needed for itching. [provider] Taking Active Spouse/Significant Other  LANTUS SOLOSTAR 100 UNIT/ML Solostar Pen [id number] Yes Inject 25 Units into the skin 2 (two) times daily. [provider] Taking Active Spouse/Significant Other  omeprazole (PRILOSEC) 20 MG capsule [id number] Yes Take 20 mg by mouth daily.  [provider] Taking Active Spouse/Significant Other           Med Note Sandrea Hammond D   Tue Nov 03, 2016 10:58 AM)    ONE TOUCH ULTRA TEST test strip [id number] Yes 2 (two) times daily. as directed [provider] Taking Active   pioglitazone (ACTOS) 30 MG tablet [id number] Yes Take 30 mg by mouth every morning.  [provider] Taking Active Spouse/Significant Other  QUEtiapine (SEROQUEL) 100 MG tablet [id number] Yes Take 100 mg by mouth 2 (two) times  a day. [provider] Taking Active   rivastigmine (EXELON) 13.3 MG/24HR [id number] Yes Place 1 patch (13.3 mg total) onto the skin daily. Cameron Sprang, MD Taking Active   traMADol Veatrice Bourbon) 50 MG tablet [id number] Yes Take by mouth. [provider] Taking Active   traZODone (DESYREL) 100 MG tablet [id number] Yes Take 100 mg by mouth at bedtime. [provider] Taking Active            Assessment: Drugs sorted by system:  Neurologic/Psychologic: alprazolam, fluoxetine, quetiapine, trazodone  -Parkinson's: carbidopa-levodopa, rivastigmine  Hematologic: aspirin 46m  Pulmonary/Allergy: Dupixent / hydroxzyzine (eczema)  Gastrointestinal: omeprazole  Endocrine:insulin glargine, pioglitazone  Genitourinary:solfenacin  Medication Review Findings:  Alprazolam: Per [place] list, this medication should be avoided in elderly patients with dementia or cognitive impairment because of adverse CNS effects / potential of inducing or worsening delirium.  Avoid use in the elderly with a history of falls or fractures (unless for seizure and mood disorders) due to potential for ataxia, impaired psychomotor function, syncope, and additional falls.  . Consider substitution to LOT therapy (lorazepam, oxazepam, temazepam) and adjust as clinically warranted (patient has had 3 falls in the last week alone)  Medication Assistance Findings:  Medication assistance needs identified: long-acting insulin  Extra Help:  Not eligible for Extra Help Low Income Subsidy based on reported income and assets  Patient Assistance Programs: Lantus made by SAlbertson'so Income requirement met: Yes o Out-of-pocket prescription expenditure met:   No (2% household income) - Patient has not met application requirements to apply for this patient assistance program at this time. - Reviewed program requirements with patient.    Basaglar made by EThurmondrequirement met: Yes o Out-of-pocket prescription expenditure met:   Not Applicable - Patient has met application requirements to apply for this patient assistance program.   - Reviewed program requirements with patient.  Spouse agreeable to apply for Basaglar as patient unlikely to have met out of pocket expenditure for Lantus.   Plan: . I will route patient assistance letter to TDonnellytechnician who will coordinate patient assistance program  application process for medications listed above.  [facility] Centerpharmacy technician will assist with obtaining all required documents from both patient and provider(s) and submit application(s) once completed.    CRalene Bathe PharmD, BPearl [phone]

## 2018-07-08 NOTE — Patient Outreach (Signed)
[facility]) Care Management  07/08/2018  DHILLON COMUNALE 16-Jun-1934 [id number]                          Medication Assistance Referral  Referral From: [place] / Gean [place] Patient application portion:  Mailed Provider application portion: Faxed  to Dr. Laqueta Due   Follow up:  Will follow up with patient in 7-10 business days to confirm application(s) have been received.  Maud Deed Chana Bode [place] Certified Pharmacy Technician [place] Management Direct Dial:[phone]

## 2018-07-11 ENCOUNTER — Other Ambulatory Visit: Payer: Self-pay | Admitting: *Deleted

## 2018-07-11 DIAGNOSIS — S72009A Fracture of unspecified part of neck of unspecified femur, initial encounter for closed fracture: Secondary | ICD-10-CM

## 2018-07-11 NOTE — Patient Outreach (Signed)
[facility]) Care Management  07/11/2018  Evan Vaughan 01/27/1935 [id number]   CSW made contact with pt's wife today by phone who reports pt is hospitalized at [facility]; post fall and hip surgery for fracture. "he had to have a partial hip replacement", wife shared.  CSW offered emotional support to wife; talked at length with her about possible scenarios for him; including Hospice/Palliative Care and/or SNF rehab.  CSW wife to call hospital and inquire about a consult while there for palliative and/or Hospice to evaluate and offer input/services.   CSW will plan a call back to wife tomorrow for further updates and support.    Eduard Clos, MSW, [place] Worker  [place] [phone]

## 2018-07-12 ENCOUNTER — Other Ambulatory Visit: Payer: Self-pay | Admitting: *Deleted

## 2018-07-12 DIAGNOSIS — G20 Parkinson's disease: Secondary | ICD-10-CM | POA: Diagnosis not present

## 2018-07-12 DIAGNOSIS — M2550 Pain in unspecified joint: Secondary | ICD-10-CM | POA: Diagnosis not present

## 2018-07-12 DIAGNOSIS — R2681 Unsteadiness on feet: Secondary | ICD-10-CM | POA: Diagnosis not present

## 2018-07-12 DIAGNOSIS — R300 Dysuria: Secondary | ICD-10-CM | POA: Diagnosis not present

## 2018-07-12 DIAGNOSIS — S72051D Unspecified fracture of head of right femur, subsequent encounter for closed fracture with routine healing: Secondary | ICD-10-CM | POA: Diagnosis not present

## 2018-07-12 DIAGNOSIS — R1312 Dysphagia, oropharyngeal phase: Secondary | ICD-10-CM | POA: Diagnosis not present

## 2018-07-12 DIAGNOSIS — L03115 Cellulitis of right lower limb: Secondary | ICD-10-CM | POA: Diagnosis not present

## 2018-07-12 DIAGNOSIS — F0390 Unspecified dementia without behavioral disturbance: Secondary | ICD-10-CM | POA: Diagnosis not present

## 2018-07-12 DIAGNOSIS — F0280 Dementia in other diseases classified elsewhere without behavioral disturbance: Secondary | ICD-10-CM | POA: Diagnosis not present

## 2018-07-12 DIAGNOSIS — Z4789 Encounter for other orthopedic aftercare: Secondary | ICD-10-CM | POA: Diagnosis not present

## 2018-07-12 DIAGNOSIS — M6281 Muscle weakness (generalized): Secondary | ICD-10-CM | POA: Diagnosis not present

## 2018-07-12 DIAGNOSIS — S72001A Fracture of unspecified part of neck of right femur, initial encounter for closed fracture: Secondary | ICD-10-CM | POA: Diagnosis not present

## 2018-07-12 DIAGNOSIS — I10 Essential (primary) hypertension: Secondary | ICD-10-CM | POA: Diagnosis not present

## 2018-07-12 DIAGNOSIS — E559 Vitamin D deficiency, unspecified: Secondary | ICD-10-CM | POA: Diagnosis not present

## 2018-07-12 DIAGNOSIS — E119 Type 2 diabetes mellitus without complications: Secondary | ICD-10-CM | POA: Diagnosis not present

## 2018-07-12 DIAGNOSIS — W19XXXA Unspecified fall, initial encounter: Secondary | ICD-10-CM | POA: Diagnosis not present

## 2018-07-12 DIAGNOSIS — G4700 Insomnia, unspecified: Secondary | ICD-10-CM | POA: Diagnosis not present

## 2018-07-12 DIAGNOSIS — F330 Major depressive disorder, recurrent, mild: Secondary | ICD-10-CM | POA: Diagnosis not present

## 2018-07-12 DIAGNOSIS — Z7401 Bed confinement status: Secondary | ICD-10-CM | POA: Diagnosis not present

## 2018-07-12 DIAGNOSIS — S72009A Fracture of unspecified part of neck of unspecified femur, initial encounter for closed fracture: Secondary | ICD-10-CM | POA: Diagnosis not present

## 2018-07-12 DIAGNOSIS — K219 Gastro-esophageal reflux disease without esophagitis: Secondary | ICD-10-CM | POA: Diagnosis not present

## 2018-07-12 DIAGNOSIS — E785 Hyperlipidemia, unspecified: Secondary | ICD-10-CM | POA: Diagnosis not present

## 2018-07-12 DIAGNOSIS — M25551 Pain in right hip: Secondary | ICD-10-CM | POA: Diagnosis not present

## 2018-07-12 NOTE — Patient Outreach (Signed)
[facility]) Care Management  07/12/2018  Evan Vaughan 1934/11/06 [id number]  CSW spoke with pt's wife again today who reports he is still in the hospital. "they tell me he is doing ok". She is unable to visit because of COVID restrictions. They are looking for a SNF rehab facility to move him to for rehab but it needs to be memory care per wife.   CSW offered support,encouragement and guidance regarding plans. CSW advised wife of plans to check in next week and encouraged her to call CSW if needs arise.    Eduard Clos, MSW, [place] Worker  [place] [phone]

## 2018-07-13 IMAGING — MR MR CERVICAL SPINE W/O CM
5 series · 28 of 48 positions shown · non-contrast
Comparison: [facility] Brain MRI 11/14/2014 and earlier.
Cervical spine MRI 03/21/2003.

ADDENDUM:
Study discussed by telephone with Dr. SAVIO LOCKLEAR on 12/02/2016 at
4233 hours.
CLINICAL DATA: 81-year-old male status post fall 1 week ago.
Confusion, gait and memory problems. Hyper reflexia.

EXAM:
MRI CERVICAL SPINE WITHOUT CONTRAST
TECHNIQUE: Multiplanar, multisequence MR imaging of the cervical spine was
performed. No intravenous contrast was administered.

[Series 2: T2 · sagittal · 3.0mm · 0.41mm/px · 6 of 13 slices shown (1 of 2)]
[im 1/13]
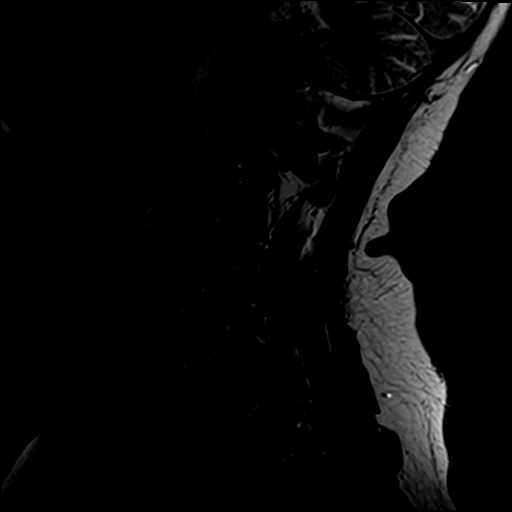
[im 3/13]
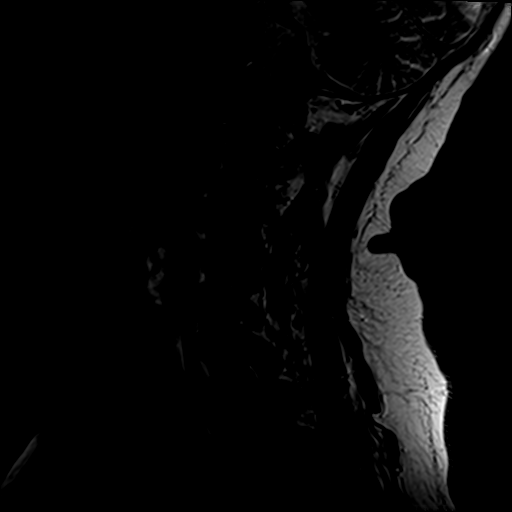
[im 5/13]
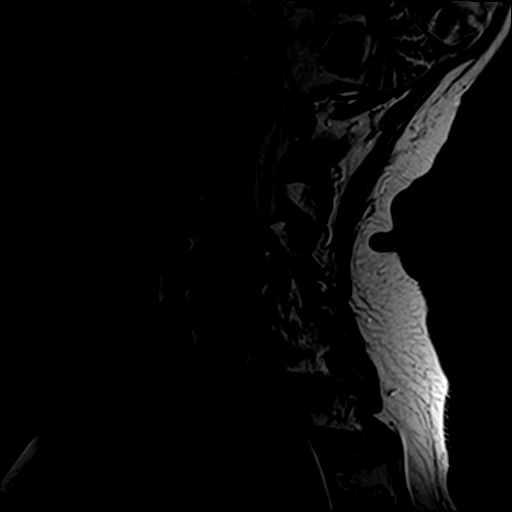
[im 8/13]
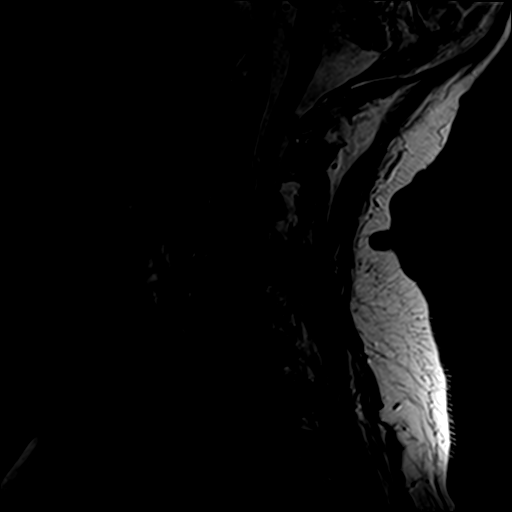
[im 10/13]
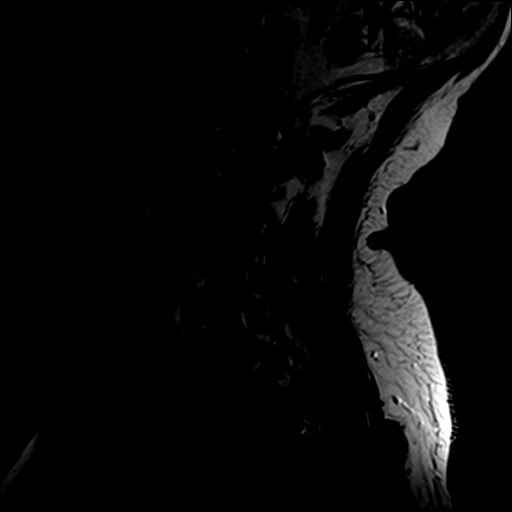
[im 13/13]
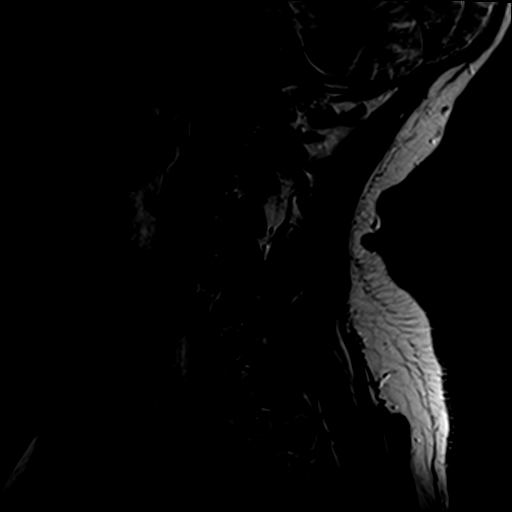

[Series 3: T1 · sagittal · 3.0mm · 0.41mm/px · 6 of 13 slices shown]
[im 1/13]
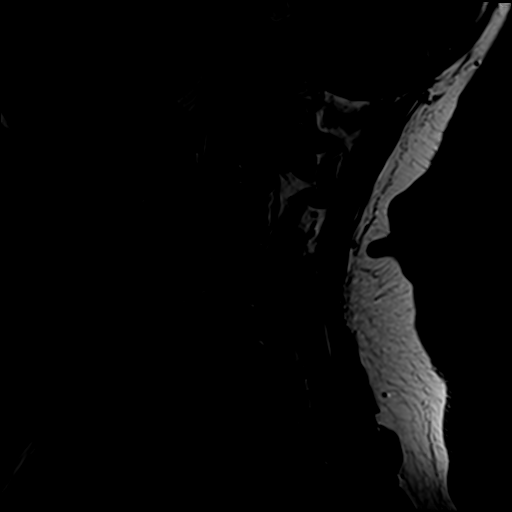
[im 3/13]
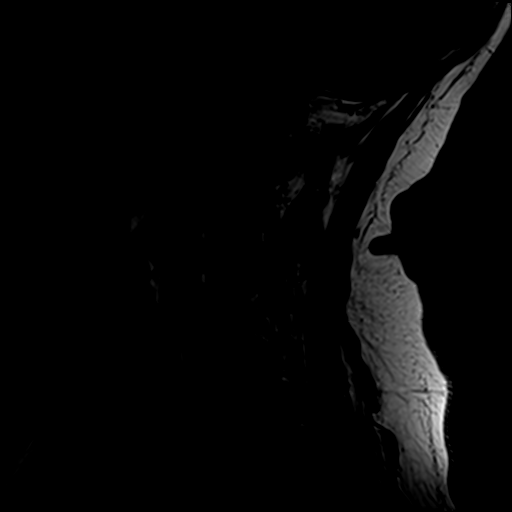
[im 5/13]
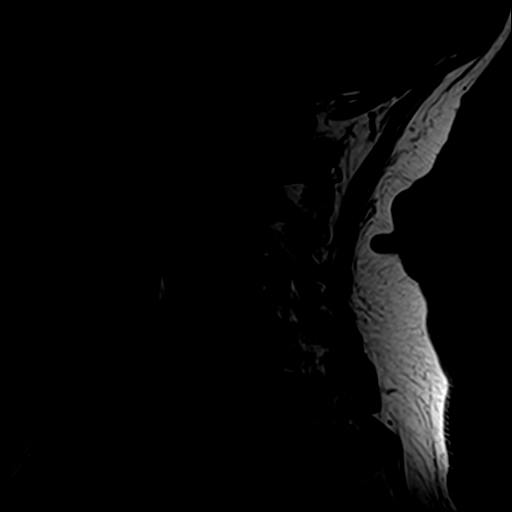
[im 8/13]
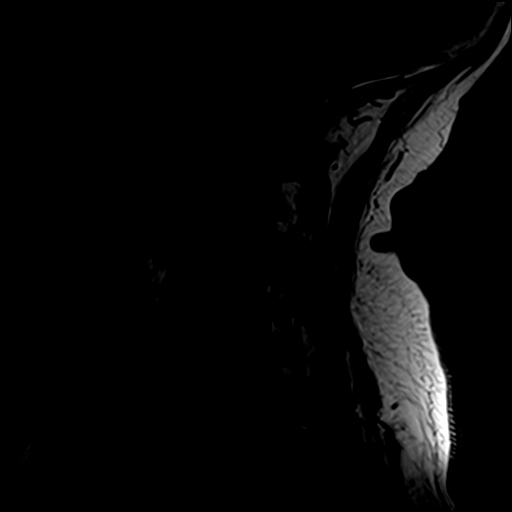
[im 10/13]
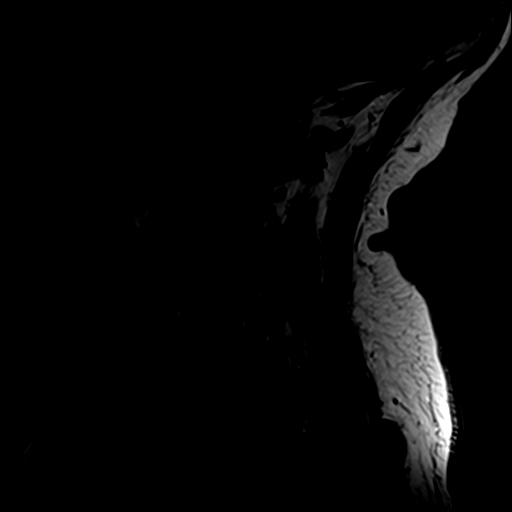
[im 13/13]
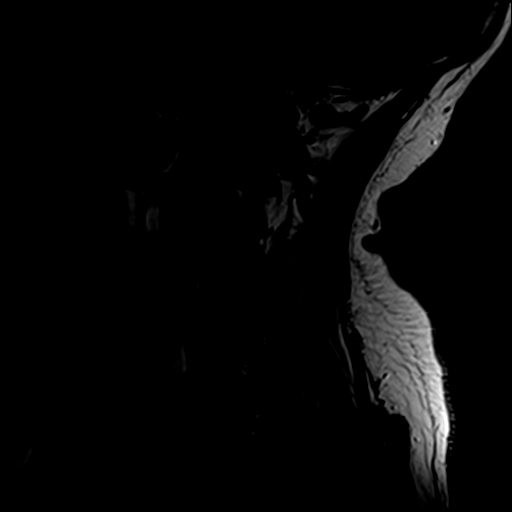

[Series 5: GRE · axial · 3.0mm · 0.35mm/px · 1 of 34 slices shown]
[im 1/34]
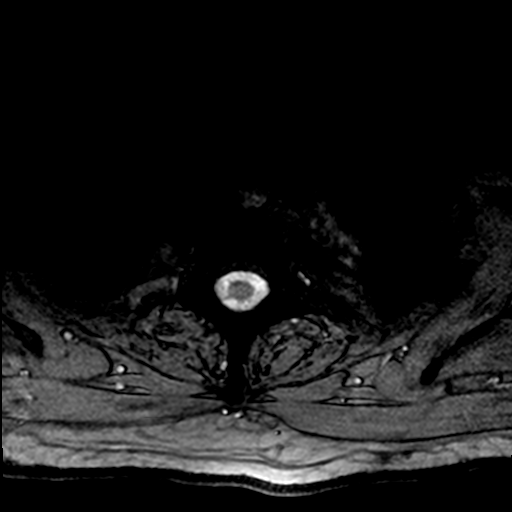

[Series 6: T2 · axial · 3.0mm · 0.70mm/px · z∈[-119,+1]mm · 9 of 34 slices shown (2 of 2)]
[im 1/34]
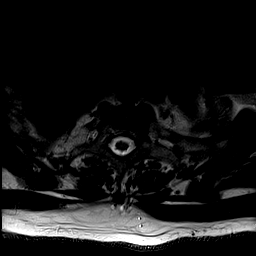
[im 5/34]
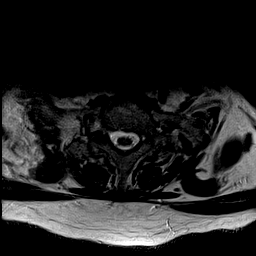
[im 10/34]
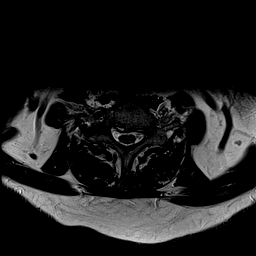
[im 15/34]
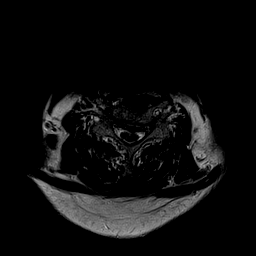
[im 17/34]
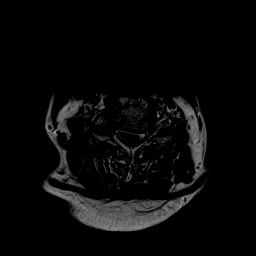
[im 19/34]
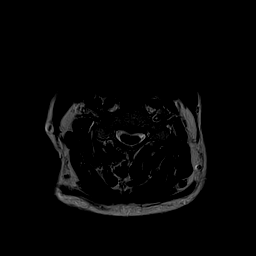
[im 24/34]
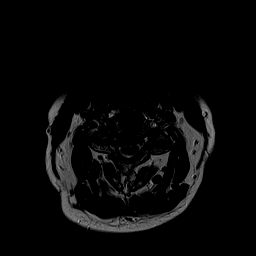
[im 29/34]
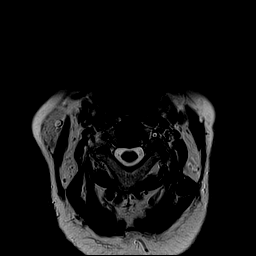
[im 34/34]
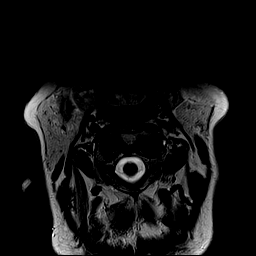

[Series 7: STIR · sagittal · 3.0mm · 0.82mm/px · 6 of 13 slices shown]
[im 1/13]
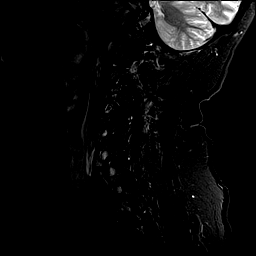
[im 3/13]
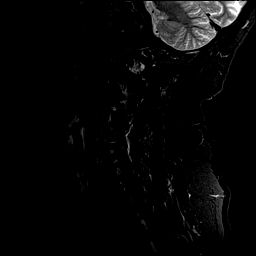
[im 5/13]
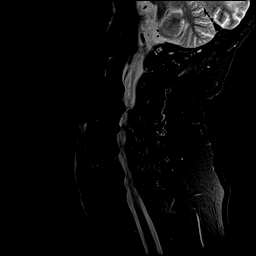
[im 8/13]
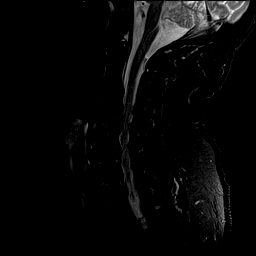
[im 10/13]
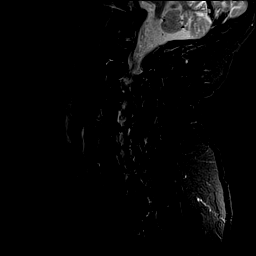
[im 13/13]
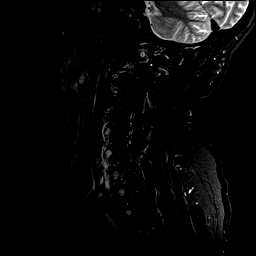

[28 of 48 positions shown; findings below may reference images not displayed]

FINDINGS: Alignment: Mild straightening of cervical lordosis. Mild levoconvex
curvature of the cervical spine.

Vertebrae: Degenerative endplate marrow changes. No marrow edema or
evidence of acute osseous abnormality.

Cord: No cervical spinal cord signal abnormality despite multilevel
spinal cord mass effect, detailed below. Visible upper thoracic
spinal cord appears normal.

Posterior Fossa, vertebral arteries, paraspinal tissues: Stable
visible cerebellum and brainstem since 3294. Chronic brainstem
volume loss appears stable.

The distal Left vertebral artery has occluded since 3294, and
appears occluded throughout the neck today.

The other major vascular flow voids in the neck are preserved. Other
neck soft tissues appear normal.

Disc levels:

C2-C3: Moderate to severe left facet hypertrophy. Foraminal endplate
spurring. Severe left C3 foraminal stenosis.

C3-C4: Right eccentric circumferential disc osteophyte complex with
broad-based posterior component. Moderate to severe facet and
ligament flavum hypertrophy greater on the right. Spinal stenosis
with mild to moderate spinal cord mass effect. Moderate left and
severe right C4 foraminal stenosis.

C4-C5: Disc space loss with circumferential disc osteophyte complex.
Broad-based posterior component (series 6, image 15). Mild to
moderate facet and ligament flavum hypertrophy. Mild to moderate
spinal cord mass effect and severe bilateral C6 foraminal stenosis.

C5-C6: Circumferential disc bulge and endplate spurring with
broad-based posterior component. Mild facet hypertrophy. Spinal
stenosis with mild to moderate spinal cord mass effect. Moderate to
severe bilateral C6 foraminal stenosis.

C6-C7: Severe disc space loss. Right eccentric circumferential disc
osteophyte complex with broad-based posterior right paracentral
component (series 6, image 24). Spinal stenosis with mild spinal
cord mass effect. Severe left and moderate to severe right C7
foraminal stenosis.

C7-T1: Circumferential disc osteophyte complex with broad-based
posterior component. Bilateral foraminal involvement. Mild facet and
ligament flavum hypertrophy. No significant spinal stenosis.
Moderate left and moderate to severe right C8 foraminal stenosis.

T1-T2: Mild facet hypertrophy.  No stenosis.

T2-T3:  Negative.
IMPRESSION: 1. Occluded Left Vertebral Artery which appears to be new since the
3294 Brain MRI. But I suspect this is chronic as the visible
cerebellar and brainstem signal appears stable. A degree of chronic
brainstem atrophy is suspected.
2. Multilevel degenerative cervical spinal stenosis with up to
moderate spinal cord mass effect C3-C4 through C6-C7. No spinal cord
myelomalacia identified.
3. Associated widespread moderate and severe multifactorial cervical
neural foraminal stenosis, sparing only the right C3 nerve level.
4. Negative visualized upper thoracic spine.

## 2018-07-13 IMAGING — MR MR LUMBAR SPINE W/O CM
5 series · 46 of 48 positions shown · non-contrast
Comparison: [facility] Lumbar radiographs 05/07/2016. CT
Abdomen and Pelvis 11/08/2015. Lumbar MRI 04/11/2010.

CLINICAL DATA: 81-year-old male status post fall 1 week ago.
Confusion, gait and memory problems. Hyper reflexia.

EXAM:
MRI LUMBAR SPINE WITHOUT CONTRAST
TECHNIQUE: Multiplanar, multisequence MR imaging of the lumbar spine was
performed. No intravenous contrast was administered.

[Series 3: tirm sag · sagittal · 4.0mm · 0.59mm/px · 4 of 15 slices shown]
[im 1/15]
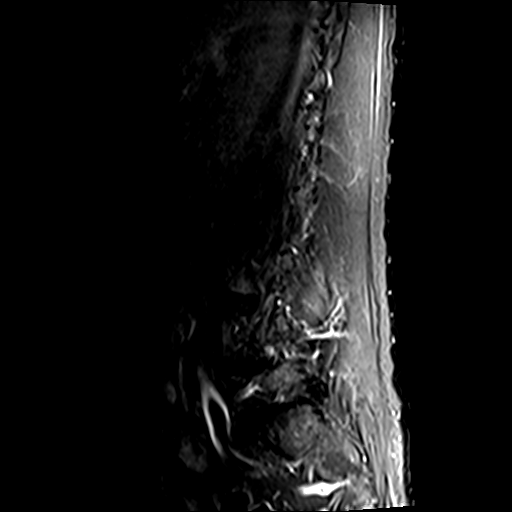
[im 5/15]
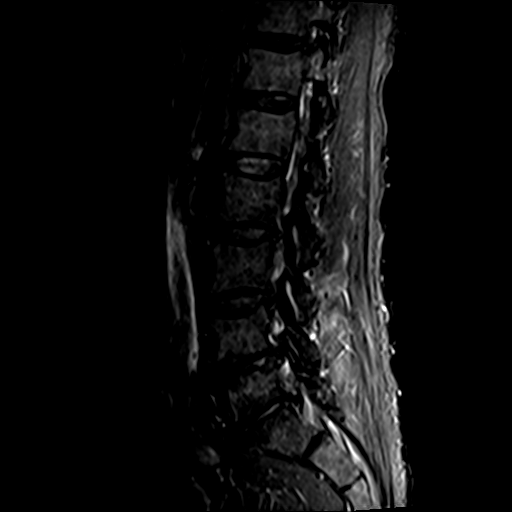
[im 10/15]
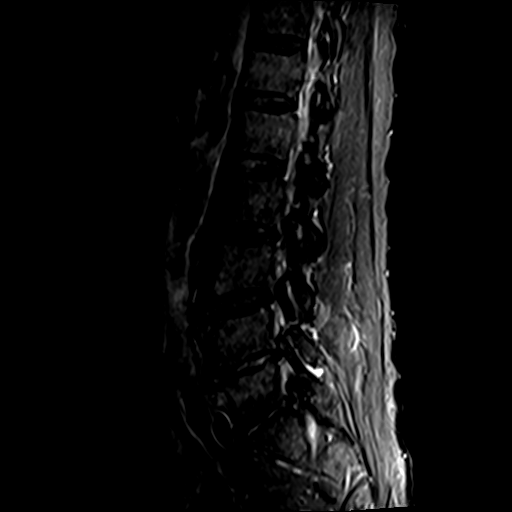
[im 15/15]
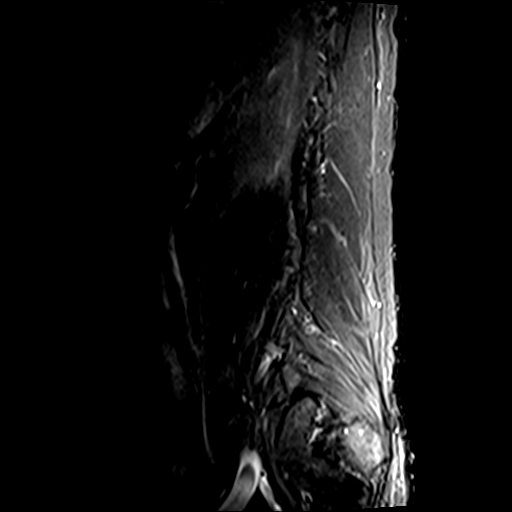

[Series 4: T2 · sagittal · 4.0mm · 0.94mm/px · 5 of 15 slices shown (1 of 2)]
[im 1/15]
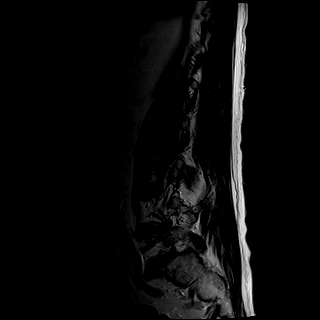
[im 4/15]
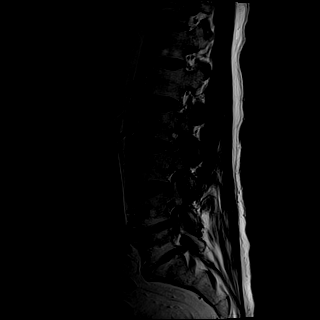
[im 8/15]
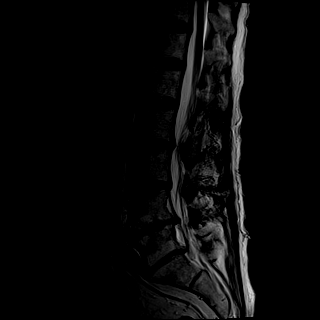
[im 11/15]
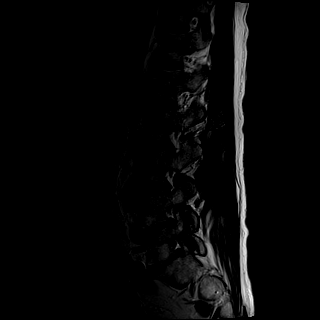
[im 15/15]
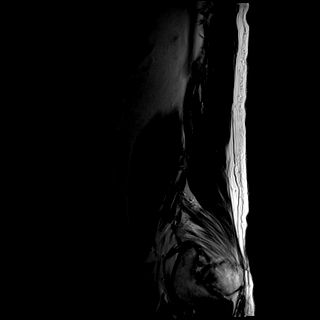

[Series 5: T1 · sagittal · 4.0mm · 0.94mm/px · 5 of 15 slices shown (1 of 2)]
[im 1/15]
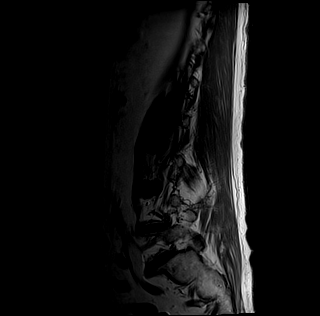
[im 4/15]
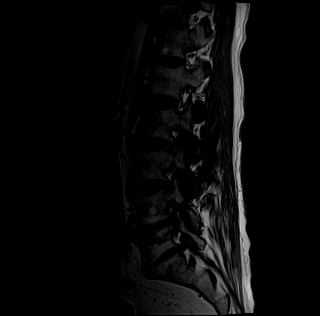
[im 8/15]
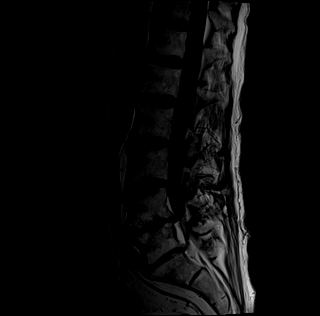
[im 11/15]
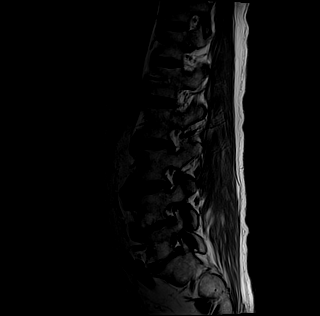
[im 15/15]
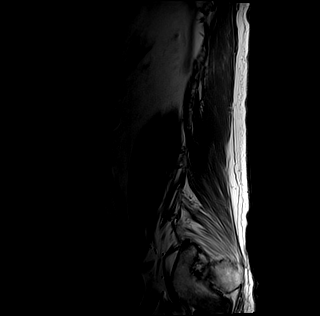

[Series 6: T1 · axial · 4.0mm · 0.78mm/px · z∈[-81,+186]mm · 15 of 57 slices shown (2 of 2)]
[im 1/57]
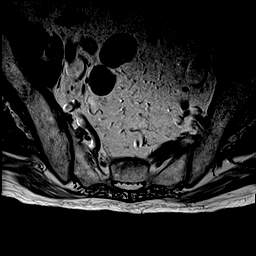
[im 4/57]
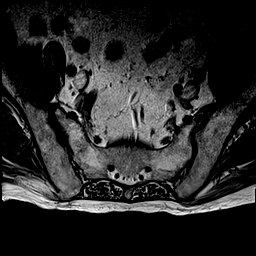
[im 8/57]
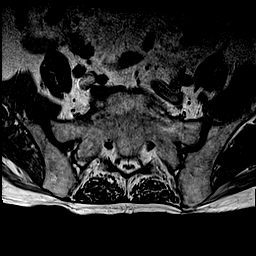
[im 11/57]
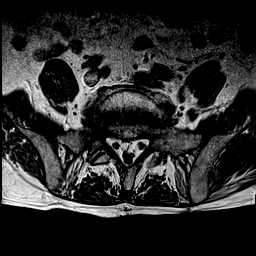
[im 15/57]
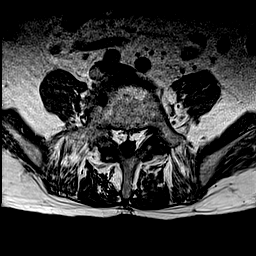
[im 18/57]
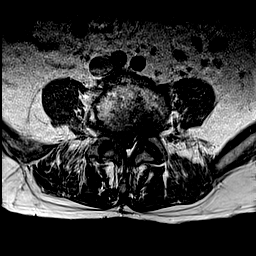
[im 22/57]
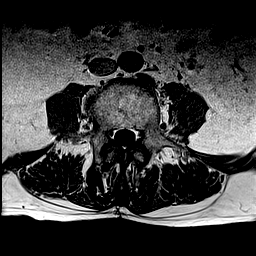
[im 25/57]
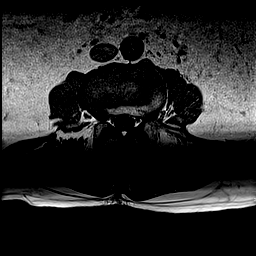
[im 29/57]
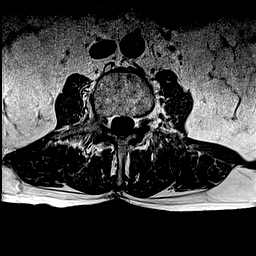
[im 32/57]
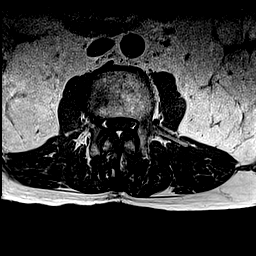
[im 36/57]
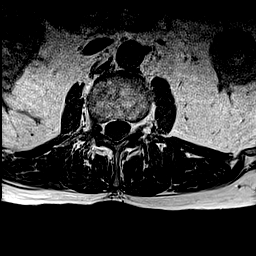
[im 39/57]
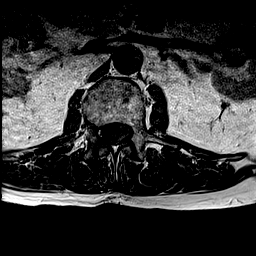
[im 46/57]
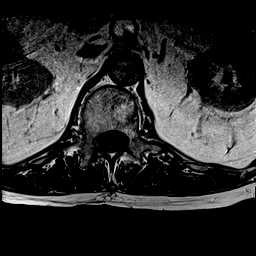
[im 50/57]
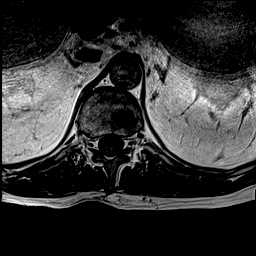
[im 53/57]
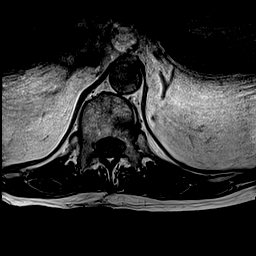

[Series 7: T2 · axial · 4.0mm · 0.78mm/px · z∈[-81,+206]mm · 17 of 57 slices shown (2 of 2)]
[im 1/57]
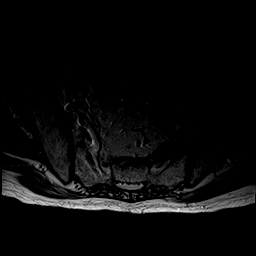
[im 4/57]
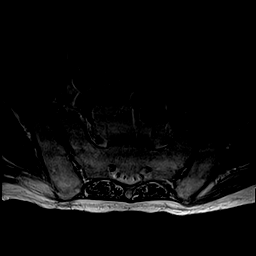
[im 8/57]
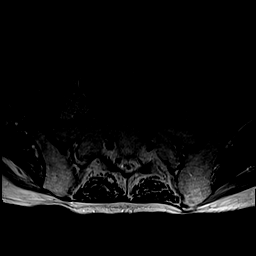
[im 11/57]
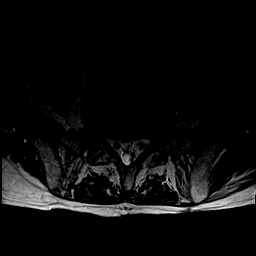
[im 15/57]
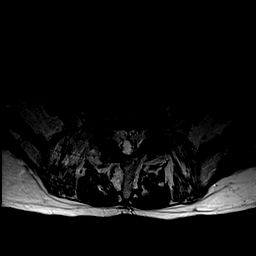
[im 18/57]
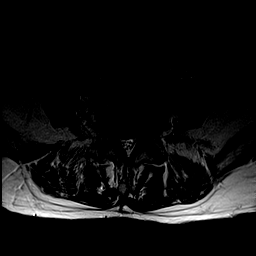
[im 22/57]
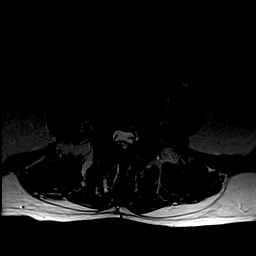
[im 25/57]
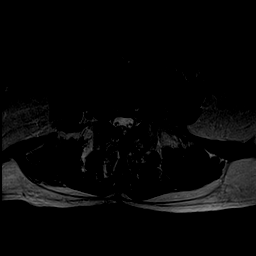
[im 29/57]
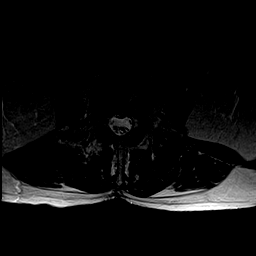
[im 32/57]
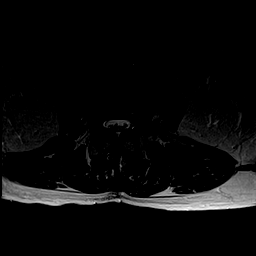
[im 36/57]
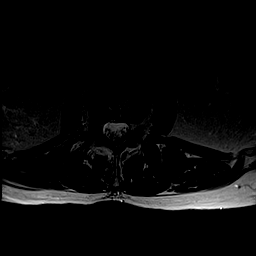
[im 39/57]
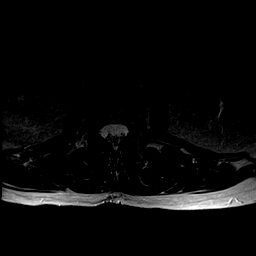
[im 43/57]
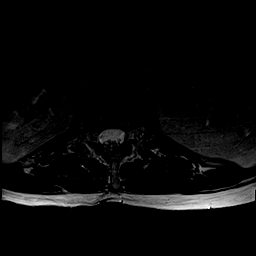
[im 46/57]
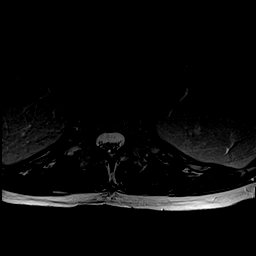
[im 50/57]
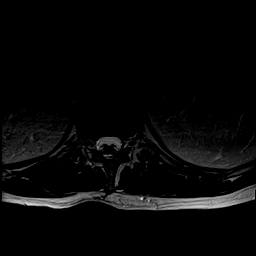
[im 53/57]
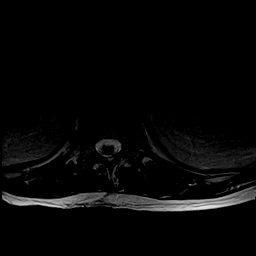
[im 57/57]
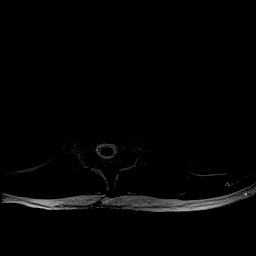

[46 of 48 positions shown; findings below may reference images not displayed]

FINDINGS: Segmentation: Normal as demonstrated on the comparison CT, which is
the same numbering system used on the 5845 MRI.

Alignment: Stable straightening of lower lumbar lordosis since 5845.
Subtle retrolisthesis of C3 on C4 is stable.

Vertebrae: Degenerative endplate marrow signal changes at L4-L5 and
L5-S1. Underlying normal bone marrow signal. No marrow edema or
evidence of acute osseous abnormality. Intact visible sacrum and SI
joints.

Conus medullaris: Extends to the L1 level and appears normal.

Paraspinal and other soft tissues: Negative.

Disc levels:

T11-T12: Mild disc bulge and facet hypertrophy.

T12-L1: Tiny central disc protrusion (series 7, image 10), no
stenosis.

L1-L2:  Negative.

L2-L3: Chronic circumferential disc bulge and mild to moderate facet
hypertrophy. Progressed broad-based posterior component since 5845.
Increased mild spinal stenosis.

L3-L4: Circumferential disc bulge and endplate spurring with
broad-based posterior component. Moderate facet and mild to moderate
ligament flavum hypertrophy. No significant stenosis.

L4-L5: Chronic disc space loss with bulky circumferential disc
osteophyte complex. Increased broad-based posterior component since
5845. Moderate to severe facet and ligament flavum hypertrophy.
Increased epidural lipomatosis. Increased spinal stenosis, now
moderate to severe with associated right lateral recess stenosis
(descending right L5 nerve root level). Moderate to severe right L4
foraminal stenosis is stable. Mild left foraminal stenosis appears
increased.

L5-S1: Chronic disc space loss with circumferential disc osteophyte
complex and broad-based posterior component. Moderate facet
hypertrophy. No spinal stenosis. Stable mild right lateral recess
stenosis (descending right S1 nerve root level). Stable mild
bilateral L5 foraminal stenosis.
IMPRESSION: 1. Advanced chronic disc, endplate, and posterior element
degeneration at L4-L5 has progressed since 5845 along with moderate
to severe spinal and right lateral recess stenosis. Moderate to
severe right L4 foraminal stenosis appears stable.
2. Progressed lumbar disc degeneration at L2-L3 and L3-L4 since
5845, but only mild associated spinal stenosis at the former.
3. Stable chronic degeneration at L5-S1 with mild right lateral
recess and bilateral foraminal stenosis.

## 2018-07-14 DIAGNOSIS — E119 Type 2 diabetes mellitus without complications: Secondary | ICD-10-CM | POA: Diagnosis not present

## 2018-07-14 DIAGNOSIS — S72001A Fracture of unspecified part of neck of right femur, initial encounter for closed fracture: Secondary | ICD-10-CM | POA: Diagnosis not present

## 2018-07-14 DIAGNOSIS — I10 Essential (primary) hypertension: Secondary | ICD-10-CM | POA: Diagnosis not present

## 2018-07-14 DIAGNOSIS — E785 Hyperlipidemia, unspecified: Secondary | ICD-10-CM | POA: Diagnosis not present

## 2018-07-18 DIAGNOSIS — L03115 Cellulitis of right lower limb: Secondary | ICD-10-CM | POA: Diagnosis not present

## 2018-07-19 ENCOUNTER — Ambulatory Visit: Payer: Self-pay | Admitting: *Deleted

## 2018-07-19 ENCOUNTER — Other Ambulatory Visit: Payer: Self-pay | Admitting: *Deleted

## 2018-07-19 DIAGNOSIS — F330 Major depressive disorder, recurrent, mild: Secondary | ICD-10-CM | POA: Diagnosis not present

## 2018-07-19 DIAGNOSIS — G4700 Insomnia, unspecified: Secondary | ICD-10-CM | POA: Diagnosis not present

## 2018-07-19 DIAGNOSIS — L03115 Cellulitis of right lower limb: Secondary | ICD-10-CM | POA: Diagnosis not present

## 2018-07-19 DIAGNOSIS — F0280 Dementia in other diseases classified elsewhere without behavioral disturbance: Secondary | ICD-10-CM | POA: Diagnosis not present

## 2018-07-19 DIAGNOSIS — G20 Parkinson's disease: Secondary | ICD-10-CM | POA: Diagnosis not present

## 2018-07-19 DIAGNOSIS — I10 Essential (primary) hypertension: Secondary | ICD-10-CM | POA: Diagnosis not present

## 2018-07-19 DIAGNOSIS — E119 Type 2 diabetes mellitus without complications: Secondary | ICD-10-CM | POA: Diagnosis not present

## 2018-07-19 DIAGNOSIS — S72001A Fracture of unspecified part of neck of right femur, initial encounter for closed fracture: Secondary | ICD-10-CM | POA: Diagnosis not present

## 2018-07-19 NOTE — Patient Outreach (Signed)
[facility]) Care Management  07/19/2018  Evan Vaughan [birth year] [id number]   CSW spoke with pt's wife by phone today. Pt has been transferred from hospital to [facility] rehab in [place], [place]. She shared that he has fallen a few times at the SNF already and she is worried and not confident about him staying there. She has spoken with one of his medical providers who is seeking other options for him. She also shared with CSW other concerns/complaints related to the SNF. CSW advised her about the [employer] and that she can call and make a complaint to if she wants to report the facility issues. Pt's wife stated that someone else has also told her that. CSW provided her with telephone # to [facility].  CSW offered emotional support to the wife; she has been able to get some rest and was able to celebrate her birthday over the weekend with her family.     Eduard Clos, MSW, [place] Worker  [place] [phone]

## 2018-07-21 DIAGNOSIS — S72001A Fracture of unspecified part of neck of right femur, initial encounter for closed fracture: Secondary | ICD-10-CM | POA: Diagnosis not present

## 2018-07-26 ENCOUNTER — Other Ambulatory Visit: Payer: Self-pay | Admitting: *Deleted

## 2018-07-26 DIAGNOSIS — E119 Type 2 diabetes mellitus without complications: Secondary | ICD-10-CM | POA: Diagnosis not present

## 2018-07-26 DIAGNOSIS — F330 Major depressive disorder, recurrent, mild: Secondary | ICD-10-CM | POA: Diagnosis not present

## 2018-07-26 DIAGNOSIS — F0280 Dementia in other diseases classified elsewhere without behavioral disturbance: Secondary | ICD-10-CM | POA: Diagnosis not present

## 2018-07-26 DIAGNOSIS — G20 Parkinson's disease: Secondary | ICD-10-CM | POA: Diagnosis not present

## 2018-07-26 DIAGNOSIS — G4700 Insomnia, unspecified: Secondary | ICD-10-CM | POA: Diagnosis not present

## 2018-07-26 NOTE — Patient Outreach (Signed)
[place] [facility]) Care Management  07/26/2018  Evan Vaughan 30-Oct-1934 [id number]   CSW spoke with pt's wife who reports pt is improving at the SNF rehab. "he walked 20 feet with therapy". She is hopeful he will continue to progress with therapy and be able to return home. She has not been able to visit because of the COVID restrictions and has gotten updates from pt's son who did a through the window visit.  "he was confused and getting him home will hopefully help".  CSW offered support and will have covering CSW check in with wife next week while covering for me. This CSW will plan to touch base again in 2 weeks.   Eduard Clos, MSW, LCSW Clinical Social Worker  [place], MSW, [place] Worker  [place] [phone]

## 2018-07-28 DIAGNOSIS — R300 Dysuria: Secondary | ICD-10-CM | POA: Diagnosis not present

## 2018-07-28 DIAGNOSIS — L03115 Cellulitis of right lower limb: Secondary | ICD-10-CM | POA: Diagnosis not present

## 2018-07-28 DIAGNOSIS — E119 Type 2 diabetes mellitus without complications: Secondary | ICD-10-CM | POA: Diagnosis not present

## 2018-07-28 DIAGNOSIS — I10 Essential (primary) hypertension: Secondary | ICD-10-CM | POA: Diagnosis not present

## 2018-07-29 DIAGNOSIS — G20 Parkinson's disease: Secondary | ICD-10-CM | POA: Diagnosis not present

## 2018-08-01 DIAGNOSIS — G20 Parkinson's disease: Secondary | ICD-10-CM | POA: Diagnosis not present

## 2018-08-01 DIAGNOSIS — S72001A Fracture of unspecified part of neck of right femur, initial encounter for closed fracture: Secondary | ICD-10-CM | POA: Diagnosis not present

## 2018-08-01 DIAGNOSIS — E785 Hyperlipidemia, unspecified: Secondary | ICD-10-CM | POA: Diagnosis not present

## 2018-08-01 DIAGNOSIS — I10 Essential (primary) hypertension: Secondary | ICD-10-CM | POA: Diagnosis not present

## 2018-08-03 DIAGNOSIS — I4891 Unspecified atrial fibrillation: Secondary | ICD-10-CM | POA: Diagnosis not present

## 2018-08-03 DIAGNOSIS — Z7982 Long term (current) use of aspirin: Secondary | ICD-10-CM | POA: Diagnosis not present

## 2018-08-03 DIAGNOSIS — R5381 Other malaise: Secondary | ICD-10-CM | POA: Diagnosis not present

## 2018-08-03 DIAGNOSIS — G20 Parkinson's disease: Secondary | ICD-10-CM | POA: Diagnosis not present

## 2018-08-03 DIAGNOSIS — F0280 Dementia in other diseases classified elsewhere without behavioral disturbance: Secondary | ICD-10-CM | POA: Diagnosis present

## 2018-08-03 DIAGNOSIS — R52 Pain, unspecified: Secondary | ICD-10-CM | POA: Diagnosis not present

## 2018-08-03 DIAGNOSIS — M2550 Pain in unspecified joint: Secondary | ICD-10-CM | POA: Diagnosis not present

## 2018-08-03 DIAGNOSIS — F419 Anxiety disorder, unspecified: Secondary | ICD-10-CM | POA: Diagnosis not present

## 2018-08-03 DIAGNOSIS — S72052A Unspecified fracture of head of left femur, initial encounter for closed fracture: Secondary | ICD-10-CM | POA: Diagnosis not present

## 2018-08-03 DIAGNOSIS — E785 Hyperlipidemia, unspecified: Secondary | ICD-10-CM | POA: Diagnosis present

## 2018-08-03 DIAGNOSIS — M19012 Primary osteoarthritis, left shoulder: Secondary | ICD-10-CM | POA: Diagnosis present

## 2018-08-03 DIAGNOSIS — S72042A Displaced fracture of base of neck of left femur, initial encounter for closed fracture: Secondary | ICD-10-CM | POA: Diagnosis not present

## 2018-08-03 DIAGNOSIS — Z8669 Personal history of other diseases of the nervous system and sense organs: Secondary | ICD-10-CM | POA: Diagnosis not present

## 2018-08-03 DIAGNOSIS — R0902 Hypoxemia: Secondary | ICD-10-CM | POA: Diagnosis not present

## 2018-08-03 DIAGNOSIS — I639 Cerebral infarction, unspecified: Secondary | ICD-10-CM | POA: Diagnosis not present

## 2018-08-03 DIAGNOSIS — Z66 Do not resuscitate: Secondary | ICD-10-CM | POA: Diagnosis not present

## 2018-08-03 DIAGNOSIS — Z743 Need for continuous supervision: Secondary | ICD-10-CM | POA: Diagnosis not present

## 2018-08-03 DIAGNOSIS — Z96642 Presence of left artificial hip joint: Secondary | ICD-10-CM | POA: Diagnosis not present

## 2018-08-03 DIAGNOSIS — Z794 Long term (current) use of insulin: Secondary | ICD-10-CM | POA: Diagnosis not present

## 2018-08-03 DIAGNOSIS — E119 Type 2 diabetes mellitus without complications: Secondary | ICD-10-CM | POA: Diagnosis present

## 2018-08-03 DIAGNOSIS — R262 Difficulty in walking, not elsewhere classified: Secondary | ICD-10-CM | POA: Diagnosis not present

## 2018-08-03 DIAGNOSIS — Z79899 Other long term (current) drug therapy: Secondary | ICD-10-CM | POA: Diagnosis not present

## 2018-08-03 DIAGNOSIS — M19011 Primary osteoarthritis, right shoulder: Secondary | ICD-10-CM | POA: Diagnosis present

## 2018-08-03 DIAGNOSIS — I10 Essential (primary) hypertension: Secondary | ICD-10-CM | POA: Diagnosis present

## 2018-08-03 DIAGNOSIS — M25552 Pain in left hip: Secondary | ICD-10-CM | POA: Diagnosis not present

## 2018-08-03 DIAGNOSIS — S72092A Other fracture of head and neck of left femur, initial encounter for closed fracture: Secondary | ICD-10-CM | POA: Diagnosis not present

## 2018-08-03 DIAGNOSIS — E7800 Pure hypercholesterolemia, unspecified: Secondary | ICD-10-CM | POA: Diagnosis not present

## 2018-08-03 DIAGNOSIS — R410 Disorientation, unspecified: Secondary | ICD-10-CM | POA: Diagnosis not present

## 2018-08-03 DIAGNOSIS — Z8673 Personal history of transient ischemic attack (TIA), and cerebral infarction without residual deficits: Secondary | ICD-10-CM | POA: Diagnosis not present

## 2018-08-03 DIAGNOSIS — F0390 Unspecified dementia without behavioral disturbance: Secondary | ICD-10-CM | POA: Diagnosis not present

## 2018-08-03 DIAGNOSIS — Z471 Aftercare following joint replacement surgery: Secondary | ICD-10-CM | POA: Diagnosis not present

## 2018-08-03 DIAGNOSIS — S72001A Fracture of unspecified part of neck of right femur, initial encounter for closed fracture: Secondary | ICD-10-CM | POA: Diagnosis not present

## 2018-08-03 DIAGNOSIS — Z79891 Long term (current) use of opiate analgesic: Secondary | ICD-10-CM | POA: Diagnosis not present

## 2018-08-03 DIAGNOSIS — D649 Anemia, unspecified: Secondary | ICD-10-CM | POA: Diagnosis not present

## 2018-08-03 DIAGNOSIS — E559 Vitamin D deficiency, unspecified: Secondary | ICD-10-CM | POA: Diagnosis present

## 2018-08-03 DIAGNOSIS — F05 Delirium due to known physiological condition: Secondary | ICD-10-CM | POA: Diagnosis not present

## 2018-08-03 DIAGNOSIS — K219 Gastro-esophageal reflux disease without esophagitis: Secondary | ICD-10-CM | POA: Diagnosis not present

## 2018-08-03 DIAGNOSIS — S72002A Fracture of unspecified part of neck of left femur, initial encounter for closed fracture: Secondary | ICD-10-CM | POA: Diagnosis not present

## 2018-08-03 DIAGNOSIS — S72142D Displaced intertrochanteric fracture of left femur, subsequent encounter for closed fracture with routine healing: Secondary | ICD-10-CM | POA: Diagnosis not present

## 2018-08-03 DIAGNOSIS — M1990 Unspecified osteoarthritis, unspecified site: Secondary | ICD-10-CM | POA: Diagnosis not present

## 2018-08-08 ENCOUNTER — Other Ambulatory Visit: Payer: Self-pay | Admitting: Pharmacy Technician

## 2018-08-08 NOTE — Patient Outreach (Addendum)
[place] [facility]) Care Management  08/08/2018  LYONEL MOREJON 1934/04/21 [id number]    Unsuccessful call #1 placed to patients wife regarding patient assistance application(s) for Basaglar , HIPAA compliant voicemail left.   Follow up:  Will make 2nd call attempt in 3-5 business days if call has not been returned.  Maud Deed Chana Bode [place] Certified Pharmacy Technician [place] Management Direct Dial:[phone]    ADDENDUM 1:10pm  Incoming call from patients wife, HIPAA identifiers verified. Mrs. Grenda confirms that she received [employer] patient assistance application for [employer]. She states that she will take application to the hospital with her. She states that patient recently broke other hip and hospital has arranged for her to come and sit with him. Reviewed with Mrs. Mallo what documents she would need to provide along with application. She states that she will review application once she gets to the hospital and will try to have it sent back in as soon as possible.  Maud Deed Chana Bode [place] Certified Pharmacy Technician [place] Management Direct Dial:[phone]

## 2018-08-10 ENCOUNTER — Other Ambulatory Visit: Payer: Self-pay | Admitting: *Deleted

## 2018-08-10 NOTE — Patient Outreach (Signed)
[place] [facility]) Care Management  08/10/2018  Evan Vaughan [birth year] [id number]  Patient's wife, Mose Colaizzi placed a frantic call to the [place] Management main office today, speaking with [place] Care, Care Management Assistant, requesting to receive a return call from [place] at her earliest convenience.  CSW was able to make direct contact with Mrs. Rossetti to discuss short-term rehabilitative services for patient in a skilled nursing facility versus home with home health services, as Mrs. Searles reported that patient is being discharged from [facility] today.  CSW spent a great deal of time talking with Mrs. Yeats, offering counseling and supportive services, as Mrs. Vandyke was tearful throughout the entire phone conversation and distraught about trying to make the best decision for patient, with regards to his discharge plan of care.    Mrs. Bon reported that she was convinced that she would be bringing patient home to care for him upon discharge from the hospital, but received a call from [place], Inpatient Social Worker at [facility], indicating that per her recommendation, as well as that of patient's attending hospital physician, physical therapist and occupational therapist, that it would be most beneficial for patient to receive short-term rehabilitative services in a skilled nursing facility.  Jenny Reichmann went on to explain to Mrs. Baksh that she has already secured a skilled nursing bed for patient at [facility] and that he would be transported by non-emergency ambulance later today.  Mrs. Orvis admitted that she has nightmares about patient having to go into another skilled facility because patient had a very negative experience at the last rehab facility.  Mrs. Adelson stated that patient was at [facility], receiving rehabilitative services for a hip  fracture, when he fell and fractured the other hip and needed to be readmitted into the hospital.  Mrs. Deis voiced a great deal of concern about patient not receiving the care that he needs, as well as patient going to a facility that she is not at all familiar with.  Mrs. 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  CSW was able to research [facility] to learn that they offer memory care services, have the wander-guard system and are equipped with a bed alarm.  Mrs. Bhardwaj admitted that she has a lot of guilt about not being able to bring patient home immediately, but understands that patient will at least need to be able to ambulate with assistance, before returning home.  CSW explained to Mrs. Grater that this will allow her an opportunity to make home modifications, as well as receive some much needed respite care, as patient currently requires 24 hour care and supervision and maximum assistance with activities of daily living.  CSW went on to explain to Mrs. Mcguiness that she will need to make her home more handicapped accessible, by at least installing a wheelchair ramp, so that she can safely transport patient in and out of the home, without having to always utilize the fire department for assistance.  Mrs. Frett voiced understanding and was agreeable to this plan.  Mrs. Perrell was most appreciative of CSW's call, admitting that she now realizes that short-term rehabilitative services in a skilled nursing facility is the best option for patient.  Mrs. Arya stated, "I just needed someone who cares to walk me  through the process and help me feel confident about my decision".  Mrs. Zufall now understands that home health services are only short-term and that she would have to private pay  for in-home aide services.  Mrs. Rubenstein needed to hear that she was not "neglecting" patient, or "shipping him off to be cared for by strangers", but allowing patient to receive the care that he needs at the present time, so that he can return home safely and be at his optimal level of health.  CSW will report findings to Mrs. Marcelline Deist, encouraging her to contact Mrs. Leber upon her return to the office.  Nat Christen, BSW, MSW, LCSW  Licensed Education officer, [facility]  [place] [address] [place], [place] [address] Physical Address-[address], [place], [place] [address] Toll Free Main # [phone] Fax # [phone] Cell # [phone]  Office # [phone] [email or web address]

## 2018-08-11 DIAGNOSIS — Z20828 Contact with and (suspected) exposure to other viral communicable diseases: Secondary | ICD-10-CM | POA: Diagnosis not present

## 2018-08-11 DIAGNOSIS — T428X5A Adverse effect of antiparkinsonism drugs and other central muscle-tone depressants, initial encounter: Secondary | ICD-10-CM | POA: Diagnosis not present

## 2018-08-11 DIAGNOSIS — R2681 Unsteadiness on feet: Secondary | ICD-10-CM | POA: Diagnosis not present

## 2018-08-11 DIAGNOSIS — F919 Conduct disorder, unspecified: Secondary | ICD-10-CM | POA: Diagnosis not present

## 2018-08-11 DIAGNOSIS — M5030 Other cervical disc degeneration, unspecified cervical region: Secondary | ICD-10-CM | POA: Diagnosis not present

## 2018-08-11 DIAGNOSIS — R51 Headache: Secondary | ICD-10-CM | POA: Diagnosis not present

## 2018-08-11 DIAGNOSIS — S022XXA Fracture of nasal bones, initial encounter for closed fracture: Secondary | ICD-10-CM | POA: Diagnosis not present

## 2018-08-11 DIAGNOSIS — I639 Cerebral infarction, unspecified: Secondary | ICD-10-CM | POA: Diagnosis not present

## 2018-08-11 DIAGNOSIS — Z6820 Body mass index (BMI) 20.0-20.9, adult: Secondary | ICD-10-CM | POA: Diagnosis not present

## 2018-08-11 DIAGNOSIS — G319 Degenerative disease of nervous system, unspecified: Secondary | ICD-10-CM | POA: Diagnosis not present

## 2018-08-11 DIAGNOSIS — M1990 Unspecified osteoarthritis, unspecified site: Secondary | ICD-10-CM | POA: Diagnosis not present

## 2018-08-11 DIAGNOSIS — I10 Essential (primary) hypertension: Secondary | ICD-10-CM | POA: Diagnosis not present

## 2018-08-11 DIAGNOSIS — M50321 Other cervical disc degeneration at C4-C5 level: Secondary | ICD-10-CM | POA: Diagnosis not present

## 2018-08-11 DIAGNOSIS — W19XXXA Unspecified fall, initial encounter: Secondary | ICD-10-CM | POA: Diagnosis not present

## 2018-08-11 DIAGNOSIS — F329 Major depressive disorder, single episode, unspecified: Secondary | ICD-10-CM | POA: Diagnosis not present

## 2018-08-11 DIAGNOSIS — R5381 Other malaise: Secondary | ICD-10-CM | POA: Diagnosis not present

## 2018-08-11 DIAGNOSIS — J342 Deviated nasal septum: Secondary | ICD-10-CM | POA: Diagnosis not present

## 2018-08-11 DIAGNOSIS — S72142D Displaced intertrochanteric fracture of left femur, subsequent encounter for closed fracture with routine healing: Secondary | ICD-10-CM | POA: Diagnosis not present

## 2018-08-11 DIAGNOSIS — Z7984 Long term (current) use of oral hypoglycemic drugs: Secondary | ICD-10-CM | POA: Diagnosis not present

## 2018-08-11 DIAGNOSIS — R410 Disorientation, unspecified: Secondary | ICD-10-CM | POA: Diagnosis not present

## 2018-08-11 DIAGNOSIS — S0990XA Unspecified injury of head, initial encounter: Secondary | ICD-10-CM | POA: Diagnosis not present

## 2018-08-11 DIAGNOSIS — S72002D Fracture of unspecified part of neck of left femur, subsequent encounter for closed fracture with routine healing: Secondary | ICD-10-CM | POA: Diagnosis not present

## 2018-08-11 DIAGNOSIS — Z9181 History of falling: Secondary | ICD-10-CM | POA: Diagnosis not present

## 2018-08-11 DIAGNOSIS — D649 Anemia, unspecified: Secondary | ICD-10-CM | POA: Diagnosis not present

## 2018-08-11 DIAGNOSIS — Z7982 Long term (current) use of aspirin: Secondary | ICD-10-CM | POA: Diagnosis not present

## 2018-08-11 DIAGNOSIS — Z8673 Personal history of transient ischemic attack (TIA), and cerebral infarction without residual deficits: Secondary | ICD-10-CM | POA: Diagnosis not present

## 2018-08-11 DIAGNOSIS — S199XXA Unspecified injury of neck, initial encounter: Secondary | ICD-10-CM | POA: Diagnosis not present

## 2018-08-11 DIAGNOSIS — S0003XA Contusion of scalp, initial encounter: Secondary | ICD-10-CM | POA: Diagnosis not present

## 2018-08-11 DIAGNOSIS — I499 Cardiac arrhythmia, unspecified: Secondary | ICD-10-CM | POA: Diagnosis not present

## 2018-08-11 DIAGNOSIS — S0121XA Laceration without foreign body of nose, initial encounter: Secondary | ICD-10-CM | POA: Diagnosis not present

## 2018-08-11 DIAGNOSIS — I4430 Unspecified atrioventricular block: Secondary | ICD-10-CM | POA: Diagnosis not present

## 2018-08-11 DIAGNOSIS — F05 Delirium due to known physiological condition: Secondary | ICD-10-CM | POA: Diagnosis not present

## 2018-08-11 DIAGNOSIS — Z79899 Other long term (current) drug therapy: Secondary | ICD-10-CM | POA: Diagnosis not present

## 2018-08-11 DIAGNOSIS — Z743 Need for continuous supervision: Secondary | ICD-10-CM | POA: Diagnosis not present

## 2018-08-11 DIAGNOSIS — L905 Scar conditions and fibrosis of skin: Secondary | ICD-10-CM | POA: Diagnosis not present

## 2018-08-11 DIAGNOSIS — S72002A Fracture of unspecified part of neck of left femur, initial encounter for closed fracture: Secondary | ICD-10-CM | POA: Diagnosis not present

## 2018-08-11 DIAGNOSIS — S022XXB Fracture of nasal bones, initial encounter for open fracture: Secondary | ICD-10-CM | POA: Diagnosis not present

## 2018-08-11 DIAGNOSIS — E785 Hyperlipidemia, unspecified: Secondary | ICD-10-CM | POA: Diagnosis not present

## 2018-08-11 DIAGNOSIS — Z66 Do not resuscitate: Secondary | ICD-10-CM | POA: Diagnosis not present

## 2018-08-11 DIAGNOSIS — M50322 Other cervical disc degeneration at C5-C6 level: Secondary | ICD-10-CM | POA: Diagnosis not present

## 2018-08-11 DIAGNOSIS — G20 Parkinson's disease: Secondary | ICD-10-CM | POA: Diagnosis not present

## 2018-08-11 DIAGNOSIS — F0280 Dementia in other diseases classified elsewhere without behavioral disturbance: Secondary | ICD-10-CM | POA: Diagnosis not present

## 2018-08-11 DIAGNOSIS — E119 Type 2 diabetes mellitus without complications: Secondary | ICD-10-CM | POA: Diagnosis not present

## 2018-08-11 DIAGNOSIS — M2550 Pain in unspecified joint: Secondary | ICD-10-CM | POA: Diagnosis not present

## 2018-08-11 DIAGNOSIS — F0390 Unspecified dementia without behavioral disturbance: Secondary | ICD-10-CM | POA: Diagnosis not present

## 2018-08-11 DIAGNOSIS — F419 Anxiety disorder, unspecified: Secondary | ICD-10-CM | POA: Diagnosis not present

## 2018-08-11 DIAGNOSIS — R4182 Altered mental status, unspecified: Secondary | ICD-10-CM | POA: Diagnosis not present

## 2018-08-11 DIAGNOSIS — R918 Other nonspecific abnormal finding of lung field: Secondary | ICD-10-CM | POA: Diagnosis not present

## 2018-08-11 DIAGNOSIS — Z1159 Encounter for screening for other viral diseases: Secondary | ICD-10-CM | POA: Diagnosis not present

## 2018-08-15 ENCOUNTER — Other Ambulatory Visit: Payer: Self-pay | Admitting: *Deleted

## 2018-08-15 DIAGNOSIS — Z66 Do not resuscitate: Secondary | ICD-10-CM | POA: Diagnosis not present

## 2018-08-15 DIAGNOSIS — M50321 Other cervical disc degeneration at C4-C5 level: Secondary | ICD-10-CM | POA: Diagnosis not present

## 2018-08-15 DIAGNOSIS — J342 Deviated nasal septum: Secondary | ICD-10-CM | POA: Diagnosis not present

## 2018-08-15 DIAGNOSIS — S0003XA Contusion of scalp, initial encounter: Secondary | ICD-10-CM | POA: Diagnosis not present

## 2018-08-15 DIAGNOSIS — W19XXXA Unspecified fall, initial encounter: Secondary | ICD-10-CM | POA: Diagnosis not present

## 2018-08-15 DIAGNOSIS — S022XXA Fracture of nasal bones, initial encounter for closed fracture: Secondary | ICD-10-CM | POA: Diagnosis not present

## 2018-08-15 DIAGNOSIS — R51 Headache: Secondary | ICD-10-CM | POA: Diagnosis not present

## 2018-08-15 DIAGNOSIS — S199XXA Unspecified injury of neck, initial encounter: Secondary | ICD-10-CM | POA: Diagnosis not present

## 2018-08-15 DIAGNOSIS — M50322 Other cervical disc degeneration at C5-C6 level: Secondary | ICD-10-CM | POA: Diagnosis not present

## 2018-08-15 DIAGNOSIS — I499 Cardiac arrhythmia, unspecified: Secondary | ICD-10-CM | POA: Diagnosis not present

## 2018-08-15 DIAGNOSIS — M5030 Other cervical disc degeneration, unspecified cervical region: Secondary | ICD-10-CM | POA: Diagnosis not present

## 2018-08-15 DIAGNOSIS — G319 Degenerative disease of nervous system, unspecified: Secondary | ICD-10-CM | POA: Diagnosis not present

## 2018-08-15 DIAGNOSIS — I4430 Unspecified atrioventricular block: Secondary | ICD-10-CM | POA: Diagnosis not present

## 2018-08-15 DIAGNOSIS — I10 Essential (primary) hypertension: Secondary | ICD-10-CM | POA: Diagnosis not present

## 2018-08-15 DIAGNOSIS — S022XXB Fracture of nasal bones, initial encounter for open fracture: Secondary | ICD-10-CM | POA: Diagnosis not present

## 2018-08-15 DIAGNOSIS — Z6820 Body mass index (BMI) 20.0-20.9, adult: Secondary | ICD-10-CM | POA: Diagnosis not present

## 2018-08-15 DIAGNOSIS — R918 Other nonspecific abnormal finding of lung field: Secondary | ICD-10-CM | POA: Diagnosis not present

## 2018-08-15 DIAGNOSIS — S0121XA Laceration without foreign body of nose, initial encounter: Secondary | ICD-10-CM | POA: Diagnosis not present

## 2018-08-15 DIAGNOSIS — Z20828 Contact with and (suspected) exposure to other viral communicable diseases: Secondary | ICD-10-CM | POA: Diagnosis not present

## 2018-08-15 NOTE — Patient Outreach (Signed)
[place] [facility]) Care Management  08/15/2018  Evan Vaughan Sep 22, 1934 [id number]   CSW spoke with pt's spouse by phone who provided updates. She indicates he has sustained another fracture to the other hip and was admitted to SNF rehab last week. "he has fallen twice over the weekend at the facility". Pt's wife is emotionally upset with him being at SNF and her not being able to visit because of COVID. He also has had several falls in addition to the fracture falls and she is afraid he will continue to fall at SNF. She wants to bring him home and is reminded of the physical needs he will have and that she will need additional in home support.  She has some family that have offered to help some in the home and CSW encouraged her to get a schedule of when people are available and arrange some hired help when/as she can.   CSW will outreach SNF for updates as well as plan follow up call to pt's wife who is completing Medicaid application.   CSW plans follow up call tomorrow to pt's wife for further support and assistance.  Eduard Clos, MSW, LCSW Clinical Social Worker  [place], MSW, [place] Worker  [place] [phone]

## 2018-08-16 DIAGNOSIS — I10 Essential (primary) hypertension: Secondary | ICD-10-CM | POA: Diagnosis not present

## 2018-08-16 DIAGNOSIS — S79911A Unspecified injury of right hip, initial encounter: Secondary | ICD-10-CM | POA: Diagnosis not present

## 2018-08-16 DIAGNOSIS — S022XXA Fracture of nasal bones, initial encounter for closed fracture: Secondary | ICD-10-CM | POA: Diagnosis not present

## 2018-08-16 DIAGNOSIS — F0281 Dementia in other diseases classified elsewhere with behavioral disturbance: Secondary | ICD-10-CM | POA: Diagnosis not present

## 2018-08-16 DIAGNOSIS — W19XXXA Unspecified fall, initial encounter: Secondary | ICD-10-CM | POA: Diagnosis not present

## 2018-08-16 DIAGNOSIS — G3183 Dementia with Lewy bodies: Secondary | ICD-10-CM | POA: Diagnosis not present

## 2018-08-16 DIAGNOSIS — S79912A Unspecified injury of left hip, initial encounter: Secondary | ICD-10-CM | POA: Diagnosis not present

## 2018-08-17 DIAGNOSIS — Z794 Long term (current) use of insulin: Secondary | ICD-10-CM | POA: Diagnosis not present

## 2018-08-17 DIAGNOSIS — F0281 Dementia in other diseases classified elsewhere with behavioral disturbance: Secondary | ICD-10-CM | POA: Diagnosis not present

## 2018-08-17 DIAGNOSIS — E119 Type 2 diabetes mellitus without complications: Secondary | ICD-10-CM | POA: Diagnosis not present

## 2018-08-17 DIAGNOSIS — I10 Essential (primary) hypertension: Secondary | ICD-10-CM | POA: Diagnosis not present

## 2018-08-17 DIAGNOSIS — R410 Disorientation, unspecified: Secondary | ICD-10-CM | POA: Diagnosis not present

## 2018-08-17 DIAGNOSIS — G3183 Dementia with Lewy bodies: Secondary | ICD-10-CM | POA: Diagnosis not present

## 2018-08-18 DIAGNOSIS — G3183 Dementia with Lewy bodies: Secondary | ICD-10-CM | POA: Diagnosis not present

## 2018-08-18 DIAGNOSIS — F0281 Dementia in other diseases classified elsewhere with behavioral disturbance: Secondary | ICD-10-CM | POA: Diagnosis not present

## 2018-08-18 DIAGNOSIS — I10 Essential (primary) hypertension: Secondary | ICD-10-CM | POA: Diagnosis not present

## 2018-08-18 DIAGNOSIS — S022XXA Fracture of nasal bones, initial encounter for closed fracture: Secondary | ICD-10-CM | POA: Diagnosis not present

## 2018-08-19 ENCOUNTER — Other Ambulatory Visit: Payer: Self-pay | Admitting: *Deleted

## 2018-08-19 ENCOUNTER — Other Ambulatory Visit: Payer: Self-pay | Admitting: Pharmacy Technician

## 2018-08-19 DIAGNOSIS — M6281 Muscle weakness (generalized): Secondary | ICD-10-CM | POA: Diagnosis not present

## 2018-08-19 DIAGNOSIS — F0281 Dementia in other diseases classified elsewhere with behavioral disturbance: Secondary | ICD-10-CM | POA: Diagnosis not present

## 2018-08-19 DIAGNOSIS — R296 Repeated falls: Secondary | ICD-10-CM | POA: Diagnosis not present

## 2018-08-19 DIAGNOSIS — S022XXA Fracture of nasal bones, initial encounter for closed fracture: Secondary | ICD-10-CM | POA: Diagnosis not present

## 2018-08-19 DIAGNOSIS — Z9181 History of falling: Secondary | ICD-10-CM | POA: Diagnosis not present

## 2018-08-19 DIAGNOSIS — R410 Disorientation, unspecified: Secondary | ICD-10-CM | POA: Diagnosis not present

## 2018-08-19 DIAGNOSIS — G3183 Dementia with Lewy bodies: Secondary | ICD-10-CM | POA: Diagnosis not present

## 2018-08-19 DIAGNOSIS — G20 Parkinson's disease: Secondary | ICD-10-CM | POA: Diagnosis not present

## 2018-08-19 DIAGNOSIS — F068 Other specified mental disorders due to known physiological condition: Secondary | ICD-10-CM | POA: Diagnosis not present

## 2018-08-19 NOTE — Patient Outreach (Signed)
[place] Vaughan [facility]) Care Management  08/19/2018  Evan Vaughan 12-31-1934 [id number]    Incoming call from patients wife regarding patient assistance application(s) for [employer] , HIPAA identifiers verified. [name] states that she has documents ready and will mail them back to the [facility] office.  Follow up:  Will submit application to [employer] once all documents have been received.  Maud Deed Chana Bode [place] Certified Pharmacy Technician [place] Management Direct Dial:[phone]

## 2018-08-19 NOTE — Patient Outreach (Signed)
[place] [facility]) Care Management  08/19/2018  BRAIDEN RODMAN 1935/01/04 [id number]   [place] spoke to pt;s wife by phone wo reports pt remains hospitalized at [facility]. She has been told he will be released today and she is uncomfortable driving there and picking him up- CSW advised her she can request an EMS non-emergency transport home if desired.  She is planning to take him home and has [place] arranged (expects [facility] visit tomorrow).  CSW encouraged her to get some in home support arranged and will plan follow up call Monday.  Eduard Clos, MSW, [place] Worker  [place] [phone]

## 2018-08-20 DIAGNOSIS — N183 Chronic kidney disease, stage 3 (moderate): Secondary | ICD-10-CM | POA: Diagnosis not present

## 2018-08-20 DIAGNOSIS — R350 Frequency of micturition: Secondary | ICD-10-CM | POA: Diagnosis not present

## 2018-08-20 DIAGNOSIS — Z79891 Long term (current) use of opiate analgesic: Secondary | ICD-10-CM | POA: Diagnosis not present

## 2018-08-20 DIAGNOSIS — N401 Enlarged prostate with lower urinary tract symptoms: Secondary | ICD-10-CM | POA: Diagnosis not present

## 2018-08-20 DIAGNOSIS — M1990 Unspecified osteoarthritis, unspecified site: Secondary | ICD-10-CM | POA: Diagnosis not present

## 2018-08-20 DIAGNOSIS — Z9049 Acquired absence of other specified parts of digestive tract: Secondary | ICD-10-CM | POA: Diagnosis not present

## 2018-08-20 DIAGNOSIS — Z794 Long term (current) use of insulin: Secondary | ICD-10-CM | POA: Diagnosis not present

## 2018-08-20 DIAGNOSIS — M5136 Other intervertebral disc degeneration, lumbar region: Secondary | ICD-10-CM | POA: Diagnosis not present

## 2018-08-20 DIAGNOSIS — G20 Parkinson's disease: Secondary | ICD-10-CM | POA: Diagnosis not present

## 2018-08-20 DIAGNOSIS — Z9181 History of falling: Secondary | ICD-10-CM | POA: Diagnosis not present

## 2018-08-20 DIAGNOSIS — W19XXXD Unspecified fall, subsequent encounter: Secondary | ICD-10-CM | POA: Diagnosis not present

## 2018-08-20 DIAGNOSIS — K219 Gastro-esophageal reflux disease without esophagitis: Secondary | ICD-10-CM | POA: Diagnosis not present

## 2018-08-20 DIAGNOSIS — E782 Mixed hyperlipidemia: Secondary | ICD-10-CM | POA: Diagnosis not present

## 2018-08-20 DIAGNOSIS — D649 Anemia, unspecified: Secondary | ICD-10-CM | POA: Diagnosis not present

## 2018-08-20 DIAGNOSIS — I130 Hypertensive heart and chronic kidney disease with heart failure and stage 1 through stage 4 chronic kidney disease, or unspecified chronic kidney disease: Secondary | ICD-10-CM | POA: Diagnosis not present

## 2018-08-20 DIAGNOSIS — Z7982 Long term (current) use of aspirin: Secondary | ICD-10-CM | POA: Diagnosis not present

## 2018-08-20 DIAGNOSIS — E1122 Type 2 diabetes mellitus with diabetic chronic kidney disease: Secondary | ICD-10-CM | POA: Diagnosis not present

## 2018-08-20 DIAGNOSIS — I5030 Unspecified diastolic (congestive) heart failure: Secondary | ICD-10-CM | POA: Diagnosis not present

## 2018-08-20 DIAGNOSIS — F0281 Dementia in other diseases classified elsewhere with behavioral disturbance: Secondary | ICD-10-CM | POA: Diagnosis not present

## 2018-08-20 DIAGNOSIS — D473 Essential (hemorrhagic) thrombocythemia: Secondary | ICD-10-CM | POA: Diagnosis not present

## 2018-08-20 DIAGNOSIS — S022XXD Fracture of nasal bones, subsequent encounter for fracture with routine healing: Secondary | ICD-10-CM | POA: Diagnosis not present

## 2018-08-21 DIAGNOSIS — N183 Chronic kidney disease, stage 3 (moderate): Secondary | ICD-10-CM | POA: Diagnosis not present

## 2018-08-21 DIAGNOSIS — I130 Hypertensive heart and chronic kidney disease with heart failure and stage 1 through stage 4 chronic kidney disease, or unspecified chronic kidney disease: Secondary | ICD-10-CM | POA: Diagnosis not present

## 2018-08-21 DIAGNOSIS — G20 Parkinson's disease: Secondary | ICD-10-CM | POA: Diagnosis not present

## 2018-08-21 DIAGNOSIS — F0281 Dementia in other diseases classified elsewhere with behavioral disturbance: Secondary | ICD-10-CM | POA: Diagnosis not present

## 2018-08-21 DIAGNOSIS — E1122 Type 2 diabetes mellitus with diabetic chronic kidney disease: Secondary | ICD-10-CM | POA: Diagnosis not present

## 2018-08-21 DIAGNOSIS — I5030 Unspecified diastolic (congestive) heart failure: Secondary | ICD-10-CM | POA: Diagnosis not present

## 2018-08-22 ENCOUNTER — Other Ambulatory Visit: Payer: Self-pay | Admitting: Pharmacy Technician

## 2018-08-22 ENCOUNTER — Other Ambulatory Visit: Payer: Self-pay | Admitting: *Deleted

## 2018-08-22 NOTE — Patient Outreach (Signed)
[place] [facility]) Care Management  08/22/2018  Evan Vaughan [birth year] [id number]   CSW spoke with Evan Vaughan by phone who reports he was released from hospital to home over weekend. She has been able to get him up and to the chair already today and has had a HHRN and PT visit over the weekend via [facility]  Evan Vaughan is arranging caregiver help via family/friends and some hired help.  She also is interested in getting a ramp built and CSW will ask [employer], BSW, to contact them with info on resources for ramps.   CSW will plan a follow up call later this week; PCP visit set for next Monday.   Eduard Clos, MSW, [place] Worker  [place] [phone]

## 2018-08-22 NOTE — Patient Outreach (Signed)
[place] [facility]) Care Management  08/22/2018  MARKHAM DUMLAO 07-29-1934 [id number]   Received patient portion(s) of patient assistance application for [employer].    Refaxed provider portion of application to Dr. Laqueta Due for completion.  Will fax into company once documents have been received.  Maud Deed Chana Bode [place] Certified Pharmacy Technician [place] Management Direct Dial:[phone]

## 2018-08-23 ENCOUNTER — Other Ambulatory Visit: Payer: Self-pay

## 2018-08-23 DIAGNOSIS — N183 Chronic kidney disease, stage 3 (moderate): Secondary | ICD-10-CM | POA: Diagnosis not present

## 2018-08-23 DIAGNOSIS — I130 Hypertensive heart and chronic kidney disease with heart failure and stage 1 through stage 4 chronic kidney disease, or unspecified chronic kidney disease: Secondary | ICD-10-CM | POA: Diagnosis not present

## 2018-08-23 DIAGNOSIS — G20 Parkinson's disease: Secondary | ICD-10-CM | POA: Diagnosis not present

## 2018-08-23 DIAGNOSIS — E1122 Type 2 diabetes mellitus with diabetic chronic kidney disease: Secondary | ICD-10-CM | POA: Diagnosis not present

## 2018-08-23 DIAGNOSIS — F0281 Dementia in other diseases classified elsewhere with behavioral disturbance: Secondary | ICD-10-CM | POA: Diagnosis not present

## 2018-08-23 DIAGNOSIS — I5030 Unspecified diastolic (congestive) heart failure: Secondary | ICD-10-CM | POA: Diagnosis not present

## 2018-08-23 NOTE — Patient Outreach (Signed)
[place] [facility]) Care Management  08/23/2018  Evan Vaughan 06-25-1934 [id number]  Social work referral received from Evan Vaughan, Evan Vaughan, to contact patient's spouse about assistance with building ramp at home.  Successful outreach to spouse today. Three way call conducted with spouse to [place].  Referral submitted.  Paperwork will be mailed for patient/ spouse to complete and send back to ministry.   BSW will follow up within the next two weeks to ensure they received paperwork.  Ronn Melena, BSW Social Worker [phone]

## 2018-08-25 DIAGNOSIS — I130 Hypertensive heart and chronic kidney disease with heart failure and stage 1 through stage 4 chronic kidney disease, or unspecified chronic kidney disease: Secondary | ICD-10-CM | POA: Diagnosis not present

## 2018-08-25 DIAGNOSIS — F0281 Dementia in other diseases classified elsewhere with behavioral disturbance: Secondary | ICD-10-CM | POA: Diagnosis not present

## 2018-08-25 DIAGNOSIS — I5030 Unspecified diastolic (congestive) heart failure: Secondary | ICD-10-CM | POA: Diagnosis not present

## 2018-08-25 DIAGNOSIS — E1122 Type 2 diabetes mellitus with diabetic chronic kidney disease: Secondary | ICD-10-CM | POA: Diagnosis not present

## 2018-08-25 DIAGNOSIS — G20 Parkinson's disease: Secondary | ICD-10-CM | POA: Diagnosis not present

## 2018-08-25 DIAGNOSIS — N183 Chronic kidney disease, stage 3 (moderate): Secondary | ICD-10-CM | POA: Diagnosis not present

## 2018-08-26 DIAGNOSIS — G20 Parkinson's disease: Secondary | ICD-10-CM | POA: Diagnosis not present

## 2018-08-26 DIAGNOSIS — F0281 Dementia in other diseases classified elsewhere with behavioral disturbance: Secondary | ICD-10-CM | POA: Diagnosis not present

## 2018-08-26 DIAGNOSIS — I5030 Unspecified diastolic (congestive) heart failure: Secondary | ICD-10-CM | POA: Diagnosis not present

## 2018-08-26 DIAGNOSIS — N183 Chronic kidney disease, stage 3 (moderate): Secondary | ICD-10-CM | POA: Diagnosis not present

## 2018-08-26 DIAGNOSIS — I130 Hypertensive heart and chronic kidney disease with heart failure and stage 1 through stage 4 chronic kidney disease, or unspecified chronic kidney disease: Secondary | ICD-10-CM | POA: Diagnosis not present

## 2018-08-26 DIAGNOSIS — E1122 Type 2 diabetes mellitus with diabetic chronic kidney disease: Secondary | ICD-10-CM | POA: Diagnosis not present

## 2018-08-29 DIAGNOSIS — J019 Acute sinusitis, unspecified: Secondary | ICD-10-CM | POA: Diagnosis not present

## 2018-08-29 DIAGNOSIS — R350 Frequency of micturition: Secondary | ICD-10-CM | POA: Diagnosis not present

## 2018-08-29 DIAGNOSIS — E1165 Type 2 diabetes mellitus with hyperglycemia: Secondary | ICD-10-CM | POA: Diagnosis not present

## 2018-08-29 DIAGNOSIS — R05 Cough: Secondary | ICD-10-CM | POA: Diagnosis not present

## 2018-08-29 DIAGNOSIS — I951 Orthostatic hypotension: Secondary | ICD-10-CM | POA: Diagnosis not present

## 2018-08-29 DIAGNOSIS — K625 Hemorrhage of anus and rectum: Secondary | ICD-10-CM | POA: Diagnosis not present

## 2018-08-29 DIAGNOSIS — R296 Repeated falls: Secondary | ICD-10-CM | POA: Diagnosis not present

## 2018-08-29 DIAGNOSIS — G20 Parkinson's disease: Secondary | ICD-10-CM | POA: Diagnosis not present

## 2018-08-30 DIAGNOSIS — E1122 Type 2 diabetes mellitus with diabetic chronic kidney disease: Secondary | ICD-10-CM | POA: Diagnosis not present

## 2018-08-30 DIAGNOSIS — I130 Hypertensive heart and chronic kidney disease with heart failure and stage 1 through stage 4 chronic kidney disease, or unspecified chronic kidney disease: Secondary | ICD-10-CM | POA: Diagnosis not present

## 2018-08-30 DIAGNOSIS — I5030 Unspecified diastolic (congestive) heart failure: Secondary | ICD-10-CM | POA: Diagnosis not present

## 2018-08-30 DIAGNOSIS — N183 Chronic kidney disease, stage 3 (moderate): Secondary | ICD-10-CM | POA: Diagnosis not present

## 2018-08-30 DIAGNOSIS — R05 Cough: Secondary | ICD-10-CM | POA: Diagnosis not present

## 2018-08-30 DIAGNOSIS — F0281 Dementia in other diseases classified elsewhere with behavioral disturbance: Secondary | ICD-10-CM | POA: Diagnosis not present

## 2018-08-30 DIAGNOSIS — G20 Parkinson's disease: Secondary | ICD-10-CM | POA: Diagnosis not present

## 2018-09-01 DIAGNOSIS — E1122 Type 2 diabetes mellitus with diabetic chronic kidney disease: Secondary | ICD-10-CM | POA: Diagnosis not present

## 2018-09-01 DIAGNOSIS — F0281 Dementia in other diseases classified elsewhere with behavioral disturbance: Secondary | ICD-10-CM | POA: Diagnosis not present

## 2018-09-01 DIAGNOSIS — I130 Hypertensive heart and chronic kidney disease with heart failure and stage 1 through stage 4 chronic kidney disease, or unspecified chronic kidney disease: Secondary | ICD-10-CM | POA: Diagnosis not present

## 2018-09-01 DIAGNOSIS — I5030 Unspecified diastolic (congestive) heart failure: Secondary | ICD-10-CM | POA: Diagnosis not present

## 2018-09-01 DIAGNOSIS — N183 Chronic kidney disease, stage 3 (moderate): Secondary | ICD-10-CM | POA: Diagnosis not present

## 2018-09-01 DIAGNOSIS — G20 Parkinson's disease: Secondary | ICD-10-CM | POA: Diagnosis not present

## 2018-09-02 DIAGNOSIS — M950 Acquired deformity of nose: Secondary | ICD-10-CM | POA: Diagnosis not present

## 2018-09-02 DIAGNOSIS — W19XXXA Unspecified fall, initial encounter: Secondary | ICD-10-CM | POA: Diagnosis not present

## 2018-09-02 DIAGNOSIS — S022XXA Fracture of nasal bones, initial encounter for closed fracture: Secondary | ICD-10-CM | POA: Diagnosis not present

## 2018-09-02 DIAGNOSIS — J342 Deviated nasal septum: Secondary | ICD-10-CM | POA: Diagnosis not present

## 2018-09-05 DIAGNOSIS — E1122 Type 2 diabetes mellitus with diabetic chronic kidney disease: Secondary | ICD-10-CM | POA: Diagnosis not present

## 2018-09-05 DIAGNOSIS — I130 Hypertensive heart and chronic kidney disease with heart failure and stage 1 through stage 4 chronic kidney disease, or unspecified chronic kidney disease: Secondary | ICD-10-CM | POA: Diagnosis not present

## 2018-09-05 DIAGNOSIS — I5030 Unspecified diastolic (congestive) heart failure: Secondary | ICD-10-CM | POA: Diagnosis not present

## 2018-09-05 DIAGNOSIS — N183 Chronic kidney disease, stage 3 (moderate): Secondary | ICD-10-CM | POA: Diagnosis not present

## 2018-09-05 DIAGNOSIS — G20 Parkinson's disease: Secondary | ICD-10-CM | POA: Diagnosis not present

## 2018-09-05 DIAGNOSIS — F0281 Dementia in other diseases classified elsewhere with behavioral disturbance: Secondary | ICD-10-CM | POA: Diagnosis not present

## 2018-09-06 ENCOUNTER — Other Ambulatory Visit: Payer: Self-pay | Admitting: Pharmacy Technician

## 2018-09-06 ENCOUNTER — Other Ambulatory Visit: Payer: Self-pay

## 2018-09-06 ENCOUNTER — Other Ambulatory Visit: Payer: Self-pay | Admitting: *Deleted

## 2018-09-06 DIAGNOSIS — I130 Hypertensive heart and chronic kidney disease with heart failure and stage 1 through stage 4 chronic kidney disease, or unspecified chronic kidney disease: Secondary | ICD-10-CM | POA: Diagnosis not present

## 2018-09-06 DIAGNOSIS — F0281 Dementia in other diseases classified elsewhere with behavioral disturbance: Secondary | ICD-10-CM | POA: Diagnosis not present

## 2018-09-06 DIAGNOSIS — N183 Chronic kidney disease, stage 3 (moderate): Secondary | ICD-10-CM | POA: Diagnosis not present

## 2018-09-06 DIAGNOSIS — G20 Parkinson's disease: Secondary | ICD-10-CM | POA: Diagnosis not present

## 2018-09-06 DIAGNOSIS — I5030 Unspecified diastolic (congestive) heart failure: Secondary | ICD-10-CM | POA: Diagnosis not present

## 2018-09-06 DIAGNOSIS — E1122 Type 2 diabetes mellitus with diabetic chronic kidney disease: Secondary | ICD-10-CM | POA: Diagnosis not present

## 2018-09-06 NOTE — Patient Outreach (Signed)
[facility]) Care Management  09/06/2018  Evan Vaughan [birth year] [id number]   CSW made contact with pt's wife today who reports pt is getting somewhat stronger yet is still requiring 24/7 supervision at home. HHPT is working with him and she has arranged for some private duty care about 4 hours 2 days per week.  She is expecting the RN to come today and is concerned he may have a blood clot. CSW encouraged her to reach out to PCP regarding this if needed and if the RN does not come.   He is still waking up during the night a lot to urinate; wife is not getting good rest because of this.    Per wife, pt is being followed by Palliative Care as well as HH.   Support provided and will plan to follow up next week for updates.  Eduard Clos, MSW, [place] Worker  [place] [phone]

## 2018-09-06 NOTE — Patient Outreach (Signed)
[place] [facility]) Care Management  09/06/2018  Evan Vaughan 1934-06-01 [id number]   Follow up call to patient's spouse regarding status of referral to [place] for ramp.  Spouse did receive paperwork sent by the ministry, however, she reports that a family member gave them a metal ramp.  BSW contacted [facility] to inform them that referral can be closed. BSW closing but encouraged spouse to call if additional needs arise.  Ronn Melena, BSW Social Worker [phone]

## 2018-09-06 NOTE — Patient Outreach (Signed)
[place] [facility]) Care Management  09/06/2018  ZOEY GILKESON 20-May-1934 [id number]   Received provider portion(s) of patient assistance application for [place]. Faxed completed application and required documents into [employer].  Will follow up with company in 5-7 business days to check status of application.  Maud Deed Chana Bode [place] Certified Pharmacy Technician [place] Management Direct Dial:[phone]

## 2018-09-09 DIAGNOSIS — F0281 Dementia in other diseases classified elsewhere with behavioral disturbance: Secondary | ICD-10-CM | POA: Diagnosis not present

## 2018-09-09 DIAGNOSIS — I130 Hypertensive heart and chronic kidney disease with heart failure and stage 1 through stage 4 chronic kidney disease, or unspecified chronic kidney disease: Secondary | ICD-10-CM | POA: Diagnosis not present

## 2018-09-09 DIAGNOSIS — I5030 Unspecified diastolic (congestive) heart failure: Secondary | ICD-10-CM | POA: Diagnosis not present

## 2018-09-09 DIAGNOSIS — E1122 Type 2 diabetes mellitus with diabetic chronic kidney disease: Secondary | ICD-10-CM | POA: Diagnosis not present

## 2018-09-09 DIAGNOSIS — N183 Chronic kidney disease, stage 3 (moderate): Secondary | ICD-10-CM | POA: Diagnosis not present

## 2018-09-09 DIAGNOSIS — G20 Parkinson's disease: Secondary | ICD-10-CM | POA: Diagnosis not present

## 2018-09-13 DIAGNOSIS — K219 Gastro-esophageal reflux disease without esophagitis: Secondary | ICD-10-CM | POA: Diagnosis not present

## 2018-09-13 DIAGNOSIS — N400 Enlarged prostate without lower urinary tract symptoms: Secondary | ICD-10-CM | POA: Diagnosis not present

## 2018-09-13 DIAGNOSIS — G8929 Other chronic pain: Secondary | ICD-10-CM | POA: Diagnosis not present

## 2018-09-13 DIAGNOSIS — F419 Anxiety disorder, unspecified: Secondary | ICD-10-CM | POA: Diagnosis not present

## 2018-09-13 DIAGNOSIS — Z8673 Personal history of transient ischemic attack (TIA), and cerebral infarction without residual deficits: Secondary | ICD-10-CM | POA: Diagnosis not present

## 2018-09-13 DIAGNOSIS — G20 Parkinson's disease: Secondary | ICD-10-CM | POA: Diagnosis not present

## 2018-09-13 DIAGNOSIS — I129 Hypertensive chronic kidney disease with stage 1 through stage 4 chronic kidney disease, or unspecified chronic kidney disease: Secondary | ICD-10-CM | POA: Diagnosis not present

## 2018-09-13 DIAGNOSIS — Z9181 History of falling: Secondary | ICD-10-CM | POA: Diagnosis not present

## 2018-09-13 DIAGNOSIS — G301 Alzheimer's disease with late onset: Secondary | ICD-10-CM | POA: Diagnosis not present

## 2018-09-13 DIAGNOSIS — E1122 Type 2 diabetes mellitus with diabetic chronic kidney disease: Secondary | ICD-10-CM | POA: Diagnosis not present

## 2018-09-13 DIAGNOSIS — N183 Chronic kidney disease, stage 3 (moderate): Secondary | ICD-10-CM | POA: Diagnosis not present

## 2018-09-13 DIAGNOSIS — D649 Anemia, unspecified: Secondary | ICD-10-CM | POA: Diagnosis not present

## 2018-09-13 DIAGNOSIS — F0280 Dementia in other diseases classified elsewhere without behavioral disturbance: Secondary | ICD-10-CM | POA: Diagnosis not present

## 2018-09-13 DIAGNOSIS — F329 Major depressive disorder, single episode, unspecified: Secondary | ICD-10-CM | POA: Diagnosis not present

## 2018-09-14 ENCOUNTER — Other Ambulatory Visit: Payer: Self-pay | Admitting: Pharmacy Technician

## 2018-09-14 ENCOUNTER — Encounter: Payer: Self-pay | Admitting: *Deleted

## 2018-09-14 ENCOUNTER — Other Ambulatory Visit: Payer: Self-pay | Admitting: *Deleted

## 2018-09-14 DIAGNOSIS — D649 Anemia, unspecified: Secondary | ICD-10-CM | POA: Diagnosis not present

## 2018-09-14 DIAGNOSIS — F0280 Dementia in other diseases classified elsewhere without behavioral disturbance: Secondary | ICD-10-CM | POA: Diagnosis not present

## 2018-09-14 DIAGNOSIS — G301 Alzheimer's disease with late onset: Secondary | ICD-10-CM | POA: Diagnosis not present

## 2018-09-14 DIAGNOSIS — E1122 Type 2 diabetes mellitus with diabetic chronic kidney disease: Secondary | ICD-10-CM | POA: Diagnosis not present

## 2018-09-14 DIAGNOSIS — I129 Hypertensive chronic kidney disease with stage 1 through stage 4 chronic kidney disease, or unspecified chronic kidney disease: Secondary | ICD-10-CM | POA: Diagnosis not present

## 2018-09-14 DIAGNOSIS — G20 Parkinson's disease: Secondary | ICD-10-CM | POA: Diagnosis not present

## 2018-09-14 NOTE — Patient Outreach (Addendum)
[place] [facility]) Care Management  09/14/2018  Evan Vaughan 01/21/1935 [id number]   8:32am  Follow up call placed to [facility] regarding patient assistance shipping details for [name] states that order has been set up to be shipped to patient's home on 8/18.   Received In-Basket message:   [facility] LCSW Leonette Most states that patient has been accepted to receive services from [facility]  Call placed to [facility] [employer]:  Damaris Schooner to [place] and requested that they [employer]) cancel the shipment of Basaglar that was to be delivered to patient on 8/18.  Call placed to [facility] to verify program services would cover patients insulin:  Spoke to [place] who stated that patient has been switched to Palliative care and she is unable to confirm what medications would be covered under that program.  Levada Dy transferred me to [place]:   Justice Rocher states that they are still gathering information in regards to the patient and informed me that RN handling patients care is Dorris Singh transferred me to Tammy:  Left voicemail for Tammy stating that I am working on patient assistance for patient and need to verify if program would be covering medication that patient is needed assistance with. Requested that she contact me back.  Return call from Tammy:  Tammy states that patient is actually signed up for Hospice @ home not Palliative (older order). She states she will contact her social worker who is assigned to patient and have her contact me about medication coverage.  Maud Deed Chana Bode [place] Certified Pharmacy Technician [place] Management Direct [phone]

## 2018-09-14 NOTE — Patient Outreach (Signed)
[place] [facility]) Care Management  09/14/2018  SINAI MAHANY [birth year] [id number]   [place] spoke with pt's wife today who reports pt has been accepted by [facility] for services. "The Nurse is coming today and they started him on some new medicines".   CSW offered support and assured her that Hospice care will provide comfort, quality end of life assessment and care.    She is appreciative of all the guidance and support and understands [facility] case closure.  CSW encouraged wife to call if needs arise that we can assist with.   CSW will advise [facility] team and PCP of above and sign off.   Eduard Clos, MSW, [place] Worker  [place] [phone]

## 2018-09-15 ENCOUNTER — Other Ambulatory Visit: Payer: Self-pay | Admitting: Pharmacy Technician

## 2018-09-15 NOTE — Patient Outreach (Signed)
[facility]) Care Management  09/15/2018  [name] 1934-08-27 [id number]   Return call from [place] with [place]. Maudie Mercury verifies that patient's [name] is not covered under his Hospice coverage list. Maudie Mercury also states that RN that will be following his case is Jinny Blossom and can outreached at [phone].  Contacted [facility] and requested that Youth worker re-initiated. Roselyn Reef states that she put in request and suggested follow up be in 2-3 business days to check status.  Maud Deed Chana Bode [place] Certified Pharmacy Technician [place] Management Direct [phone]

## 2018-09-18 DIAGNOSIS — E1122 Type 2 diabetes mellitus with diabetic chronic kidney disease: Secondary | ICD-10-CM | POA: Diagnosis not present

## 2018-09-18 DIAGNOSIS — G301 Alzheimer's disease with late onset: Secondary | ICD-10-CM | POA: Diagnosis not present

## 2018-09-18 DIAGNOSIS — F0280 Dementia in other diseases classified elsewhere without behavioral disturbance: Secondary | ICD-10-CM | POA: Diagnosis not present

## 2018-09-18 DIAGNOSIS — G20 Parkinson's disease: Secondary | ICD-10-CM | POA: Diagnosis not present

## 2018-09-18 DIAGNOSIS — D649 Anemia, unspecified: Secondary | ICD-10-CM | POA: Diagnosis not present

## 2018-09-18 DIAGNOSIS — I129 Hypertensive chronic kidney disease with stage 1 through stage 4 chronic kidney disease, or unspecified chronic kidney disease: Secondary | ICD-10-CM | POA: Diagnosis not present

## 2018-09-19 ENCOUNTER — Other Ambulatory Visit: Payer: Self-pay | Admitting: Pharmacist

## 2018-09-19 ENCOUNTER — Ambulatory Visit: Payer: Self-pay | Admitting: Pharmacist

## 2018-09-19 DIAGNOSIS — F0280 Dementia in other diseases classified elsewhere without behavioral disturbance: Secondary | ICD-10-CM | POA: Diagnosis not present

## 2018-09-19 DIAGNOSIS — G20 Parkinson's disease: Secondary | ICD-10-CM | POA: Diagnosis not present

## 2018-09-19 DIAGNOSIS — D649 Anemia, unspecified: Secondary | ICD-10-CM | POA: Diagnosis not present

## 2018-09-19 DIAGNOSIS — I129 Hypertensive chronic kidney disease with stage 1 through stage 4 chronic kidney disease, or unspecified chronic kidney disease: Secondary | ICD-10-CM | POA: Diagnosis not present

## 2018-09-19 DIAGNOSIS — G301 Alzheimer's disease with late onset: Secondary | ICD-10-CM | POA: Diagnosis not present

## 2018-09-19 DIAGNOSIS — E1122 Type 2 diabetes mellitus with diabetic chronic kidney disease: Secondary | ICD-10-CM | POA: Diagnosis not present

## 2018-09-19 NOTE — Patient Outreach (Signed)
[place] [facility]) Care Management  [place] 09/19/2018  Evan Vaughan [birth year] [id number]  Notified that patient is now in the [facility] program.  Call placed to [place], RN with Hospice, to clarify medication regimen.  Basaglar insulin will continue at current dose.  Updated Megan that insulin shipment will be arriving later this week to patient.  [facility] is happy to help with refills if patient has questions.  [facility] pharmacy technician will f/u later this week to confirm insulin received.    Ralene Bathe, PharmD, [place] [phone]

## 2018-09-20 DIAGNOSIS — E1122 Type 2 diabetes mellitus with diabetic chronic kidney disease: Secondary | ICD-10-CM | POA: Diagnosis not present

## 2018-09-20 DIAGNOSIS — D649 Anemia, unspecified: Secondary | ICD-10-CM | POA: Diagnosis not present

## 2018-09-20 DIAGNOSIS — F0280 Dementia in other diseases classified elsewhere without behavioral disturbance: Secondary | ICD-10-CM | POA: Diagnosis not present

## 2018-09-20 DIAGNOSIS — I129 Hypertensive chronic kidney disease with stage 1 through stage 4 chronic kidney disease, or unspecified chronic kidney disease: Secondary | ICD-10-CM | POA: Diagnosis not present

## 2018-09-20 DIAGNOSIS — G20 Parkinson's disease: Secondary | ICD-10-CM | POA: Diagnosis not present

## 2018-09-20 DIAGNOSIS — G301 Alzheimer's disease with late onset: Secondary | ICD-10-CM | POA: Diagnosis not present

## 2018-09-21 DIAGNOSIS — G301 Alzheimer's disease with late onset: Secondary | ICD-10-CM | POA: Diagnosis not present

## 2018-09-21 DIAGNOSIS — D649 Anemia, unspecified: Secondary | ICD-10-CM | POA: Diagnosis not present

## 2018-09-21 DIAGNOSIS — I129 Hypertensive chronic kidney disease with stage 1 through stage 4 chronic kidney disease, or unspecified chronic kidney disease: Secondary | ICD-10-CM | POA: Diagnosis not present

## 2018-09-21 DIAGNOSIS — G20 Parkinson's disease: Secondary | ICD-10-CM | POA: Diagnosis not present

## 2018-09-21 DIAGNOSIS — E1122 Type 2 diabetes mellitus with diabetic chronic kidney disease: Secondary | ICD-10-CM | POA: Diagnosis not present

## 2018-09-21 DIAGNOSIS — F0280 Dementia in other diseases classified elsewhere without behavioral disturbance: Secondary | ICD-10-CM | POA: Diagnosis not present

## 2018-09-22 DIAGNOSIS — G20 Parkinson's disease: Secondary | ICD-10-CM | POA: Diagnosis not present

## 2018-09-22 DIAGNOSIS — D649 Anemia, unspecified: Secondary | ICD-10-CM | POA: Diagnosis not present

## 2018-09-22 DIAGNOSIS — G301 Alzheimer's disease with late onset: Secondary | ICD-10-CM | POA: Diagnosis not present

## 2018-09-22 DIAGNOSIS — E1122 Type 2 diabetes mellitus with diabetic chronic kidney disease: Secondary | ICD-10-CM | POA: Diagnosis not present

## 2018-09-22 DIAGNOSIS — I129 Hypertensive chronic kidney disease with stage 1 through stage 4 chronic kidney disease, or unspecified chronic kidney disease: Secondary | ICD-10-CM | POA: Diagnosis not present

## 2018-09-22 DIAGNOSIS — F0280 Dementia in other diseases classified elsewhere without behavioral disturbance: Secondary | ICD-10-CM | POA: Diagnosis not present

## 2018-09-23 DIAGNOSIS — D649 Anemia, unspecified: Secondary | ICD-10-CM | POA: Diagnosis not present

## 2018-09-23 DIAGNOSIS — E1122 Type 2 diabetes mellitus with diabetic chronic kidney disease: Secondary | ICD-10-CM | POA: Diagnosis not present

## 2018-09-23 DIAGNOSIS — I129 Hypertensive chronic kidney disease with stage 1 through stage 4 chronic kidney disease, or unspecified chronic kidney disease: Secondary | ICD-10-CM | POA: Diagnosis not present

## 2018-09-23 DIAGNOSIS — G20 Parkinson's disease: Secondary | ICD-10-CM | POA: Diagnosis not present

## 2018-09-23 DIAGNOSIS — F0280 Dementia in other diseases classified elsewhere without behavioral disturbance: Secondary | ICD-10-CM | POA: Diagnosis not present

## 2018-09-23 DIAGNOSIS — G301 Alzheimer's disease with late onset: Secondary | ICD-10-CM | POA: Diagnosis not present

## 2018-09-26 DIAGNOSIS — I129 Hypertensive chronic kidney disease with stage 1 through stage 4 chronic kidney disease, or unspecified chronic kidney disease: Secondary | ICD-10-CM | POA: Diagnosis not present

## 2018-09-26 DIAGNOSIS — F0280 Dementia in other diseases classified elsewhere without behavioral disturbance: Secondary | ICD-10-CM | POA: Diagnosis not present

## 2018-09-26 DIAGNOSIS — E1122 Type 2 diabetes mellitus with diabetic chronic kidney disease: Secondary | ICD-10-CM | POA: Diagnosis not present

## 2018-09-26 DIAGNOSIS — G301 Alzheimer's disease with late onset: Secondary | ICD-10-CM | POA: Diagnosis not present

## 2018-09-26 DIAGNOSIS — G20 Parkinson's disease: Secondary | ICD-10-CM | POA: Diagnosis not present

## 2018-09-26 DIAGNOSIS — D649 Anemia, unspecified: Secondary | ICD-10-CM | POA: Diagnosis not present

## 2018-09-27 DIAGNOSIS — D649 Anemia, unspecified: Secondary | ICD-10-CM | POA: Diagnosis not present

## 2018-09-27 DIAGNOSIS — G20 Parkinson's disease: Secondary | ICD-10-CM | POA: Diagnosis not present

## 2018-09-27 DIAGNOSIS — I129 Hypertensive chronic kidney disease with stage 1 through stage 4 chronic kidney disease, or unspecified chronic kidney disease: Secondary | ICD-10-CM | POA: Diagnosis not present

## 2018-09-27 DIAGNOSIS — G301 Alzheimer's disease with late onset: Secondary | ICD-10-CM | POA: Diagnosis not present

## 2018-09-27 DIAGNOSIS — F0280 Dementia in other diseases classified elsewhere without behavioral disturbance: Secondary | ICD-10-CM | POA: Diagnosis not present

## 2018-09-27 DIAGNOSIS — E1122 Type 2 diabetes mellitus with diabetic chronic kidney disease: Secondary | ICD-10-CM | POA: Diagnosis not present

## 2018-09-28 DIAGNOSIS — G301 Alzheimer's disease with late onset: Secondary | ICD-10-CM | POA: Diagnosis not present

## 2018-09-28 DIAGNOSIS — D649 Anemia, unspecified: Secondary | ICD-10-CM | POA: Diagnosis not present

## 2018-09-28 DIAGNOSIS — E1122 Type 2 diabetes mellitus with diabetic chronic kidney disease: Secondary | ICD-10-CM | POA: Diagnosis not present

## 2018-09-28 DIAGNOSIS — F0280 Dementia in other diseases classified elsewhere without behavioral disturbance: Secondary | ICD-10-CM | POA: Diagnosis not present

## 2018-09-28 DIAGNOSIS — I129 Hypertensive chronic kidney disease with stage 1 through stage 4 chronic kidney disease, or unspecified chronic kidney disease: Secondary | ICD-10-CM | POA: Diagnosis not present

## 2018-09-28 DIAGNOSIS — G20 Parkinson's disease: Secondary | ICD-10-CM | POA: Diagnosis not present

## 2018-09-29 DIAGNOSIS — G301 Alzheimer's disease with late onset: Secondary | ICD-10-CM | POA: Diagnosis not present

## 2018-09-29 DIAGNOSIS — D649 Anemia, unspecified: Secondary | ICD-10-CM | POA: Diagnosis not present

## 2018-09-29 DIAGNOSIS — E1122 Type 2 diabetes mellitus with diabetic chronic kidney disease: Secondary | ICD-10-CM | POA: Diagnosis not present

## 2018-09-29 DIAGNOSIS — G20 Parkinson's disease: Secondary | ICD-10-CM | POA: Diagnosis not present

## 2018-09-29 DIAGNOSIS — I129 Hypertensive chronic kidney disease with stage 1 through stage 4 chronic kidney disease, or unspecified chronic kidney disease: Secondary | ICD-10-CM | POA: Diagnosis not present

## 2018-09-29 DIAGNOSIS — F0280 Dementia in other diseases classified elsewhere without behavioral disturbance: Secondary | ICD-10-CM | POA: Diagnosis not present

## 2018-09-30 DIAGNOSIS — G20 Parkinson's disease: Secondary | ICD-10-CM | POA: Diagnosis not present

## 2018-09-30 DIAGNOSIS — D649 Anemia, unspecified: Secondary | ICD-10-CM | POA: Diagnosis not present

## 2018-09-30 DIAGNOSIS — G301 Alzheimer's disease with late onset: Secondary | ICD-10-CM | POA: Diagnosis not present

## 2018-09-30 DIAGNOSIS — F0280 Dementia in other diseases classified elsewhere without behavioral disturbance: Secondary | ICD-10-CM | POA: Diagnosis not present

## 2018-09-30 DIAGNOSIS — I129 Hypertensive chronic kidney disease with stage 1 through stage 4 chronic kidney disease, or unspecified chronic kidney disease: Secondary | ICD-10-CM | POA: Diagnosis not present

## 2018-09-30 DIAGNOSIS — E1122 Type 2 diabetes mellitus with diabetic chronic kidney disease: Secondary | ICD-10-CM | POA: Diagnosis not present

## 2018-10-01 DIAGNOSIS — G20 Parkinson's disease: Secondary | ICD-10-CM | POA: Diagnosis not present

## 2018-10-01 DIAGNOSIS — G301 Alzheimer's disease with late onset: Secondary | ICD-10-CM | POA: Diagnosis not present

## 2018-10-01 DIAGNOSIS — D649 Anemia, unspecified: Secondary | ICD-10-CM | POA: Diagnosis not present

## 2018-10-01 DIAGNOSIS — E1122 Type 2 diabetes mellitus with diabetic chronic kidney disease: Secondary | ICD-10-CM | POA: Diagnosis not present

## 2018-10-01 DIAGNOSIS — I129 Hypertensive chronic kidney disease with stage 1 through stage 4 chronic kidney disease, or unspecified chronic kidney disease: Secondary | ICD-10-CM | POA: Diagnosis not present

## 2018-10-01 DIAGNOSIS — F0280 Dementia in other diseases classified elsewhere without behavioral disturbance: Secondary | ICD-10-CM | POA: Diagnosis not present

## 2018-10-02 DIAGNOSIS — G301 Alzheimer's disease with late onset: Secondary | ICD-10-CM | POA: Diagnosis not present

## 2018-10-02 DIAGNOSIS — D649 Anemia, unspecified: Secondary | ICD-10-CM | POA: Diagnosis not present

## 2018-10-02 DIAGNOSIS — I129 Hypertensive chronic kidney disease with stage 1 through stage 4 chronic kidney disease, or unspecified chronic kidney disease: Secondary | ICD-10-CM | POA: Diagnosis not present

## 2018-10-02 DIAGNOSIS — G20 Parkinson's disease: Secondary | ICD-10-CM | POA: Diagnosis not present

## 2018-10-02 DIAGNOSIS — F0280 Dementia in other diseases classified elsewhere without behavioral disturbance: Secondary | ICD-10-CM | POA: Diagnosis not present

## 2018-10-02 DIAGNOSIS — E1122 Type 2 diabetes mellitus with diabetic chronic kidney disease: Secondary | ICD-10-CM | POA: Diagnosis not present

## 2018-10-03 ENCOUNTER — Other Ambulatory Visit: Payer: Self-pay | Admitting: Pharmacy Technician

## 2018-10-03 ENCOUNTER — Other Ambulatory Visit: Payer: Self-pay | Admitting: Pharmacist

## 2018-10-03 DIAGNOSIS — D649 Anemia, unspecified: Secondary | ICD-10-CM | POA: Diagnosis not present

## 2018-10-03 DIAGNOSIS — E1122 Type 2 diabetes mellitus with diabetic chronic kidney disease: Secondary | ICD-10-CM | POA: Diagnosis not present

## 2018-10-03 DIAGNOSIS — G20 Parkinson's disease: Secondary | ICD-10-CM | POA: Diagnosis not present

## 2018-10-03 DIAGNOSIS — I129 Hypertensive chronic kidney disease with stage 1 through stage 4 chronic kidney disease, or unspecified chronic kidney disease: Secondary | ICD-10-CM | POA: Diagnosis not present

## 2018-10-03 DIAGNOSIS — F0280 Dementia in other diseases classified elsewhere without behavioral disturbance: Secondary | ICD-10-CM | POA: Diagnosis not present

## 2018-10-03 DIAGNOSIS — G301 Alzheimer's disease with late onset: Secondary | ICD-10-CM | POA: Diagnosis not present

## 2018-10-03 NOTE — Patient Outreach (Signed)
[facility]) Care Management [place]  10/03/2018  Evan Vaughan 02/16/1934 [id number]  Patient has been approved for [place] patient assistance program(s).  Patient has been instructed on how to order refills and renewal process for 2020.    Plan: [place] case is being closed due to the following reasons: -Goals of care have been met. -I have provided my contact information if patient or family needs to reach out to me in the future.  -Thank you for allowing [facility] pharmacy to be involved in this patient's care.    Ralene Bathe, PharmD, [place] [phone]

## 2018-10-03 NOTE — Patient Outreach (Signed)
[place] [facility]) Care Management  10/03/2018  GRADYN FEIK 09-13-1934 [id number]    Successful call placed to patients grandson, Coby regarding patient assistance medication delivery of Basaglar, HIPAA identifiers verified. Coby states that Mrs. Krigbaum had stepped out for an appointment. Coby confirms that [name] was received from [employer]. Left message with Coby requesting that they (Mrs. Sieck or him) contact me with any questions that they may have about medication.  Follow up:  Will route note to [place] for case closure  Maud Deed. Chana Bode [place] Certified Pharmacy Technician [place] Management Direct Dial:[phone]

## 2018-10-04 DIAGNOSIS — Z9181 History of falling: Secondary | ICD-10-CM | POA: Diagnosis not present

## 2018-10-04 DIAGNOSIS — Z8673 Personal history of transient ischemic attack (TIA), and cerebral infarction without residual deficits: Secondary | ICD-10-CM | POA: Diagnosis not present

## 2018-10-04 DIAGNOSIS — G8929 Other chronic pain: Secondary | ICD-10-CM | POA: Diagnosis not present

## 2018-10-04 DIAGNOSIS — N400 Enlarged prostate without lower urinary tract symptoms: Secondary | ICD-10-CM | POA: Diagnosis not present

## 2018-10-04 DIAGNOSIS — I129 Hypertensive chronic kidney disease with stage 1 through stage 4 chronic kidney disease, or unspecified chronic kidney disease: Secondary | ICD-10-CM | POA: Diagnosis not present

## 2018-10-04 DIAGNOSIS — F0280 Dementia in other diseases classified elsewhere without behavioral disturbance: Secondary | ICD-10-CM | POA: Diagnosis not present

## 2018-10-04 DIAGNOSIS — F419 Anxiety disorder, unspecified: Secondary | ICD-10-CM | POA: Diagnosis not present

## 2018-10-04 DIAGNOSIS — E1122 Type 2 diabetes mellitus with diabetic chronic kidney disease: Secondary | ICD-10-CM | POA: Diagnosis not present

## 2018-10-04 DIAGNOSIS — N183 Chronic kidney disease, stage 3 (moderate): Secondary | ICD-10-CM | POA: Diagnosis not present

## 2018-10-04 DIAGNOSIS — D649 Anemia, unspecified: Secondary | ICD-10-CM | POA: Diagnosis not present

## 2018-10-04 DIAGNOSIS — K219 Gastro-esophageal reflux disease without esophagitis: Secondary | ICD-10-CM | POA: Diagnosis not present

## 2018-10-04 DIAGNOSIS — G301 Alzheimer's disease with late onset: Secondary | ICD-10-CM | POA: Diagnosis not present

## 2018-10-04 DIAGNOSIS — F329 Major depressive disorder, single episode, unspecified: Secondary | ICD-10-CM | POA: Diagnosis not present

## 2018-10-04 DIAGNOSIS — G20 Parkinson's disease: Secondary | ICD-10-CM | POA: Diagnosis not present

## 2018-10-05 DIAGNOSIS — I129 Hypertensive chronic kidney disease with stage 1 through stage 4 chronic kidney disease, or unspecified chronic kidney disease: Secondary | ICD-10-CM | POA: Diagnosis not present

## 2018-10-05 DIAGNOSIS — F0280 Dementia in other diseases classified elsewhere without behavioral disturbance: Secondary | ICD-10-CM | POA: Diagnosis not present

## 2018-10-05 DIAGNOSIS — E1122 Type 2 diabetes mellitus with diabetic chronic kidney disease: Secondary | ICD-10-CM | POA: Diagnosis not present

## 2018-10-05 DIAGNOSIS — D649 Anemia, unspecified: Secondary | ICD-10-CM | POA: Diagnosis not present

## 2018-10-05 DIAGNOSIS — G301 Alzheimer's disease with late onset: Secondary | ICD-10-CM | POA: Diagnosis not present

## 2018-10-05 DIAGNOSIS — G20 Parkinson's disease: Secondary | ICD-10-CM | POA: Diagnosis not present

## 2018-10-06 DIAGNOSIS — D649 Anemia, unspecified: Secondary | ICD-10-CM | POA: Diagnosis not present

## 2018-10-06 DIAGNOSIS — G301 Alzheimer's disease with late onset: Secondary | ICD-10-CM | POA: Diagnosis not present

## 2018-10-06 DIAGNOSIS — I129 Hypertensive chronic kidney disease with stage 1 through stage 4 chronic kidney disease, or unspecified chronic kidney disease: Secondary | ICD-10-CM | POA: Diagnosis not present

## 2018-10-06 DIAGNOSIS — G20 Parkinson's disease: Secondary | ICD-10-CM | POA: Diagnosis not present

## 2018-10-06 DIAGNOSIS — E1122 Type 2 diabetes mellitus with diabetic chronic kidney disease: Secondary | ICD-10-CM | POA: Diagnosis not present

## 2018-10-06 DIAGNOSIS — F0280 Dementia in other diseases classified elsewhere without behavioral disturbance: Secondary | ICD-10-CM | POA: Diagnosis not present

## 2018-10-07 DIAGNOSIS — F0280 Dementia in other diseases classified elsewhere without behavioral disturbance: Secondary | ICD-10-CM | POA: Diagnosis not present

## 2018-10-07 DIAGNOSIS — D649 Anemia, unspecified: Secondary | ICD-10-CM | POA: Diagnosis not present

## 2018-10-07 DIAGNOSIS — G301 Alzheimer's disease with late onset: Secondary | ICD-10-CM | POA: Diagnosis not present

## 2018-10-07 DIAGNOSIS — I129 Hypertensive chronic kidney disease with stage 1 through stage 4 chronic kidney disease, or unspecified chronic kidney disease: Secondary | ICD-10-CM | POA: Diagnosis not present

## 2018-10-07 DIAGNOSIS — G20 Parkinson's disease: Secondary | ICD-10-CM | POA: Diagnosis not present

## 2018-10-07 DIAGNOSIS — E1122 Type 2 diabetes mellitus with diabetic chronic kidney disease: Secondary | ICD-10-CM | POA: Diagnosis not present

## 2018-10-11 DIAGNOSIS — E1122 Type 2 diabetes mellitus with diabetic chronic kidney disease: Secondary | ICD-10-CM | POA: Diagnosis not present

## 2018-10-11 DIAGNOSIS — D649 Anemia, unspecified: Secondary | ICD-10-CM | POA: Diagnosis not present

## 2018-10-11 DIAGNOSIS — G20 Parkinson's disease: Secondary | ICD-10-CM | POA: Diagnosis not present

## 2018-10-11 DIAGNOSIS — I129 Hypertensive chronic kidney disease with stage 1 through stage 4 chronic kidney disease, or unspecified chronic kidney disease: Secondary | ICD-10-CM | POA: Diagnosis not present

## 2018-10-11 DIAGNOSIS — F0280 Dementia in other diseases classified elsewhere without behavioral disturbance: Secondary | ICD-10-CM | POA: Diagnosis not present

## 2018-10-11 DIAGNOSIS — G301 Alzheimer's disease with late onset: Secondary | ICD-10-CM | POA: Diagnosis not present

## 2018-10-12 DIAGNOSIS — F0280 Dementia in other diseases classified elsewhere without behavioral disturbance: Secondary | ICD-10-CM | POA: Diagnosis not present

## 2018-10-12 DIAGNOSIS — E1122 Type 2 diabetes mellitus with diabetic chronic kidney disease: Secondary | ICD-10-CM | POA: Diagnosis not present

## 2018-10-12 DIAGNOSIS — D649 Anemia, unspecified: Secondary | ICD-10-CM | POA: Diagnosis not present

## 2018-10-12 DIAGNOSIS — G20 Parkinson's disease: Secondary | ICD-10-CM | POA: Diagnosis not present

## 2018-10-12 DIAGNOSIS — I129 Hypertensive chronic kidney disease with stage 1 through stage 4 chronic kidney disease, or unspecified chronic kidney disease: Secondary | ICD-10-CM | POA: Diagnosis not present

## 2018-10-12 DIAGNOSIS — G301 Alzheimer's disease with late onset: Secondary | ICD-10-CM | POA: Diagnosis not present

## 2018-10-13 DIAGNOSIS — D649 Anemia, unspecified: Secondary | ICD-10-CM | POA: Diagnosis not present

## 2018-10-13 DIAGNOSIS — G301 Alzheimer's disease with late onset: Secondary | ICD-10-CM | POA: Diagnosis not present

## 2018-10-13 DIAGNOSIS — E1122 Type 2 diabetes mellitus with diabetic chronic kidney disease: Secondary | ICD-10-CM | POA: Diagnosis not present

## 2018-10-13 DIAGNOSIS — G20 Parkinson's disease: Secondary | ICD-10-CM | POA: Diagnosis not present

## 2018-10-13 DIAGNOSIS — I129 Hypertensive chronic kidney disease with stage 1 through stage 4 chronic kidney disease, or unspecified chronic kidney disease: Secondary | ICD-10-CM | POA: Diagnosis not present

## 2018-10-13 DIAGNOSIS — F0280 Dementia in other diseases classified elsewhere without behavioral disturbance: Secondary | ICD-10-CM | POA: Diagnosis not present

## 2018-10-14 DIAGNOSIS — G20 Parkinson's disease: Secondary | ICD-10-CM | POA: Diagnosis not present

## 2018-10-14 DIAGNOSIS — F0280 Dementia in other diseases classified elsewhere without behavioral disturbance: Secondary | ICD-10-CM | POA: Diagnosis not present

## 2018-10-14 DIAGNOSIS — G301 Alzheimer's disease with late onset: Secondary | ICD-10-CM | POA: Diagnosis not present

## 2018-10-14 DIAGNOSIS — E1122 Type 2 diabetes mellitus with diabetic chronic kidney disease: Secondary | ICD-10-CM | POA: Diagnosis not present

## 2018-10-14 DIAGNOSIS — I129 Hypertensive chronic kidney disease with stage 1 through stage 4 chronic kidney disease, or unspecified chronic kidney disease: Secondary | ICD-10-CM | POA: Diagnosis not present

## 2018-10-14 DIAGNOSIS — D649 Anemia, unspecified: Secondary | ICD-10-CM | POA: Diagnosis not present

## 2018-10-17 DIAGNOSIS — F0280 Dementia in other diseases classified elsewhere without behavioral disturbance: Secondary | ICD-10-CM | POA: Diagnosis not present

## 2018-10-17 DIAGNOSIS — E1122 Type 2 diabetes mellitus with diabetic chronic kidney disease: Secondary | ICD-10-CM | POA: Diagnosis not present

## 2018-10-17 DIAGNOSIS — D649 Anemia, unspecified: Secondary | ICD-10-CM | POA: Diagnosis not present

## 2018-10-17 DIAGNOSIS — G20 Parkinson's disease: Secondary | ICD-10-CM | POA: Diagnosis not present

## 2018-10-17 DIAGNOSIS — G301 Alzheimer's disease with late onset: Secondary | ICD-10-CM | POA: Diagnosis not present

## 2018-10-17 DIAGNOSIS — I129 Hypertensive chronic kidney disease with stage 1 through stage 4 chronic kidney disease, or unspecified chronic kidney disease: Secondary | ICD-10-CM | POA: Diagnosis not present

## 2018-10-18 DIAGNOSIS — I129 Hypertensive chronic kidney disease with stage 1 through stage 4 chronic kidney disease, or unspecified chronic kidney disease: Secondary | ICD-10-CM | POA: Diagnosis not present

## 2018-10-18 DIAGNOSIS — G20 Parkinson's disease: Secondary | ICD-10-CM | POA: Diagnosis not present

## 2018-10-18 DIAGNOSIS — F0280 Dementia in other diseases classified elsewhere without behavioral disturbance: Secondary | ICD-10-CM | POA: Diagnosis not present

## 2018-10-18 DIAGNOSIS — G301 Alzheimer's disease with late onset: Secondary | ICD-10-CM | POA: Diagnosis not present

## 2018-10-18 DIAGNOSIS — E1122 Type 2 diabetes mellitus with diabetic chronic kidney disease: Secondary | ICD-10-CM | POA: Diagnosis not present

## 2018-10-18 DIAGNOSIS — D649 Anemia, unspecified: Secondary | ICD-10-CM | POA: Diagnosis not present

## 2018-10-19 DIAGNOSIS — G20 Parkinson's disease: Secondary | ICD-10-CM | POA: Diagnosis not present

## 2018-10-19 DIAGNOSIS — G301 Alzheimer's disease with late onset: Secondary | ICD-10-CM | POA: Diagnosis not present

## 2018-10-19 DIAGNOSIS — D649 Anemia, unspecified: Secondary | ICD-10-CM | POA: Diagnosis not present

## 2018-10-19 DIAGNOSIS — F0280 Dementia in other diseases classified elsewhere without behavioral disturbance: Secondary | ICD-10-CM | POA: Diagnosis not present

## 2018-10-19 DIAGNOSIS — I129 Hypertensive chronic kidney disease with stage 1 through stage 4 chronic kidney disease, or unspecified chronic kidney disease: Secondary | ICD-10-CM | POA: Diagnosis not present

## 2018-10-19 DIAGNOSIS — E1122 Type 2 diabetes mellitus with diabetic chronic kidney disease: Secondary | ICD-10-CM | POA: Diagnosis not present

## 2018-10-21 DIAGNOSIS — D649 Anemia, unspecified: Secondary | ICD-10-CM | POA: Diagnosis not present

## 2018-10-21 DIAGNOSIS — G20 Parkinson's disease: Secondary | ICD-10-CM | POA: Diagnosis not present

## 2018-10-21 DIAGNOSIS — I129 Hypertensive chronic kidney disease with stage 1 through stage 4 chronic kidney disease, or unspecified chronic kidney disease: Secondary | ICD-10-CM | POA: Diagnosis not present

## 2018-10-21 DIAGNOSIS — E1122 Type 2 diabetes mellitus with diabetic chronic kidney disease: Secondary | ICD-10-CM | POA: Diagnosis not present

## 2018-10-21 DIAGNOSIS — F0280 Dementia in other diseases classified elsewhere without behavioral disturbance: Secondary | ICD-10-CM | POA: Diagnosis not present

## 2018-10-21 DIAGNOSIS — G301 Alzheimer's disease with late onset: Secondary | ICD-10-CM | POA: Diagnosis not present

## 2018-10-24 DIAGNOSIS — D649 Anemia, unspecified: Secondary | ICD-10-CM | POA: Diagnosis not present

## 2018-10-24 DIAGNOSIS — G301 Alzheimer's disease with late onset: Secondary | ICD-10-CM | POA: Diagnosis not present

## 2018-10-24 DIAGNOSIS — E1122 Type 2 diabetes mellitus with diabetic chronic kidney disease: Secondary | ICD-10-CM | POA: Diagnosis not present

## 2018-10-24 DIAGNOSIS — G20 Parkinson's disease: Secondary | ICD-10-CM | POA: Diagnosis not present

## 2018-10-24 DIAGNOSIS — I129 Hypertensive chronic kidney disease with stage 1 through stage 4 chronic kidney disease, or unspecified chronic kidney disease: Secondary | ICD-10-CM | POA: Diagnosis not present

## 2018-10-24 DIAGNOSIS — F0280 Dementia in other diseases classified elsewhere without behavioral disturbance: Secondary | ICD-10-CM | POA: Diagnosis not present

## 2018-10-26 DIAGNOSIS — E1122 Type 2 diabetes mellitus with diabetic chronic kidney disease: Secondary | ICD-10-CM | POA: Diagnosis not present

## 2018-10-26 DIAGNOSIS — I129 Hypertensive chronic kidney disease with stage 1 through stage 4 chronic kidney disease, or unspecified chronic kidney disease: Secondary | ICD-10-CM | POA: Diagnosis not present

## 2018-10-26 DIAGNOSIS — G20 Parkinson's disease: Secondary | ICD-10-CM | POA: Diagnosis not present

## 2018-10-26 DIAGNOSIS — G301 Alzheimer's disease with late onset: Secondary | ICD-10-CM | POA: Diagnosis not present

## 2018-10-26 DIAGNOSIS — F0280 Dementia in other diseases classified elsewhere without behavioral disturbance: Secondary | ICD-10-CM | POA: Diagnosis not present

## 2018-10-26 DIAGNOSIS — D649 Anemia, unspecified: Secondary | ICD-10-CM | POA: Diagnosis not present

## 2018-10-27 DIAGNOSIS — G20 Parkinson's disease: Secondary | ICD-10-CM | POA: Diagnosis not present

## 2018-10-27 DIAGNOSIS — G301 Alzheimer's disease with late onset: Secondary | ICD-10-CM | POA: Diagnosis not present

## 2018-10-27 DIAGNOSIS — E1122 Type 2 diabetes mellitus with diabetic chronic kidney disease: Secondary | ICD-10-CM | POA: Diagnosis not present

## 2018-10-27 DIAGNOSIS — M2550 Pain in unspecified joint: Secondary | ICD-10-CM | POA: Diagnosis not present

## 2018-10-27 DIAGNOSIS — F0280 Dementia in other diseases classified elsewhere without behavioral disturbance: Secondary | ICD-10-CM | POA: Diagnosis not present

## 2018-10-27 DIAGNOSIS — D649 Anemia, unspecified: Secondary | ICD-10-CM | POA: Diagnosis not present

## 2018-10-27 DIAGNOSIS — R410 Disorientation, unspecified: Secondary | ICD-10-CM | POA: Diagnosis not present

## 2018-10-27 DIAGNOSIS — Z7401 Bed confinement status: Secondary | ICD-10-CM | POA: Diagnosis not present

## 2018-10-27 DIAGNOSIS — I129 Hypertensive chronic kidney disease with stage 1 through stage 4 chronic kidney disease, or unspecified chronic kidney disease: Secondary | ICD-10-CM | POA: Diagnosis not present

## 2018-10-27 DIAGNOSIS — I959 Hypotension, unspecified: Secondary | ICD-10-CM | POA: Diagnosis not present

## 2018-10-28 DIAGNOSIS — E1122 Type 2 diabetes mellitus with diabetic chronic kidney disease: Secondary | ICD-10-CM | POA: Diagnosis not present

## 2018-10-28 DIAGNOSIS — G20 Parkinson's disease: Secondary | ICD-10-CM | POA: Diagnosis not present

## 2018-10-28 DIAGNOSIS — I129 Hypertensive chronic kidney disease with stage 1 through stage 4 chronic kidney disease, or unspecified chronic kidney disease: Secondary | ICD-10-CM | POA: Diagnosis not present

## 2018-10-28 DIAGNOSIS — G301 Alzheimer's disease with late onset: Secondary | ICD-10-CM | POA: Diagnosis not present

## 2018-10-28 DIAGNOSIS — F0280 Dementia in other diseases classified elsewhere without behavioral disturbance: Secondary | ICD-10-CM | POA: Diagnosis not present

## 2018-10-28 DIAGNOSIS — D649 Anemia, unspecified: Secondary | ICD-10-CM | POA: Diagnosis not present

## 2018-10-29 DIAGNOSIS — G301 Alzheimer's disease with late onset: Secondary | ICD-10-CM | POA: Diagnosis not present

## 2018-10-29 DIAGNOSIS — G20 Parkinson's disease: Secondary | ICD-10-CM | POA: Diagnosis not present

## 2018-10-29 DIAGNOSIS — I129 Hypertensive chronic kidney disease with stage 1 through stage 4 chronic kidney disease, or unspecified chronic kidney disease: Secondary | ICD-10-CM | POA: Diagnosis not present

## 2018-10-29 DIAGNOSIS — D649 Anemia, unspecified: Secondary | ICD-10-CM | POA: Diagnosis not present

## 2018-10-29 DIAGNOSIS — F0280 Dementia in other diseases classified elsewhere without behavioral disturbance: Secondary | ICD-10-CM | POA: Diagnosis not present

## 2018-10-29 DIAGNOSIS — E1122 Type 2 diabetes mellitus with diabetic chronic kidney disease: Secondary | ICD-10-CM | POA: Diagnosis not present

## 2018-10-30 DIAGNOSIS — G20 Parkinson's disease: Secondary | ICD-10-CM | POA: Diagnosis not present

## 2018-10-30 DIAGNOSIS — G301 Alzheimer's disease with late onset: Secondary | ICD-10-CM | POA: Diagnosis not present

## 2018-10-30 DIAGNOSIS — D649 Anemia, unspecified: Secondary | ICD-10-CM | POA: Diagnosis not present

## 2018-10-30 DIAGNOSIS — F0280 Dementia in other diseases classified elsewhere without behavioral disturbance: Secondary | ICD-10-CM | POA: Diagnosis not present

## 2018-10-30 DIAGNOSIS — E1122 Type 2 diabetes mellitus with diabetic chronic kidney disease: Secondary | ICD-10-CM | POA: Diagnosis not present

## 2018-10-30 DIAGNOSIS — I129 Hypertensive chronic kidney disease with stage 1 through stage 4 chronic kidney disease, or unspecified chronic kidney disease: Secondary | ICD-10-CM | POA: Diagnosis not present

## 2018-10-31 DIAGNOSIS — I129 Hypertensive chronic kidney disease with stage 1 through stage 4 chronic kidney disease, or unspecified chronic kidney disease: Secondary | ICD-10-CM | POA: Diagnosis not present

## 2018-10-31 DIAGNOSIS — G301 Alzheimer's disease with late onset: Secondary | ICD-10-CM | POA: Diagnosis not present

## 2018-10-31 DIAGNOSIS — F0280 Dementia in other diseases classified elsewhere without behavioral disturbance: Secondary | ICD-10-CM | POA: Diagnosis not present

## 2018-10-31 DIAGNOSIS — D649 Anemia, unspecified: Secondary | ICD-10-CM | POA: Diagnosis not present

## 2018-10-31 DIAGNOSIS — G20 Parkinson's disease: Secondary | ICD-10-CM | POA: Diagnosis not present

## 2018-10-31 DIAGNOSIS — E1122 Type 2 diabetes mellitus with diabetic chronic kidney disease: Secondary | ICD-10-CM | POA: Diagnosis not present

## 2018-11-01 DIAGNOSIS — F0280 Dementia in other diseases classified elsewhere without behavioral disturbance: Secondary | ICD-10-CM | POA: Diagnosis not present

## 2018-11-01 DIAGNOSIS — G20 Parkinson's disease: Secondary | ICD-10-CM | POA: Diagnosis not present

## 2018-11-01 DIAGNOSIS — G301 Alzheimer's disease with late onset: Secondary | ICD-10-CM | POA: Diagnosis not present

## 2018-11-01 DIAGNOSIS — D649 Anemia, unspecified: Secondary | ICD-10-CM | POA: Diagnosis not present

## 2018-11-01 DIAGNOSIS — E1122 Type 2 diabetes mellitus with diabetic chronic kidney disease: Secondary | ICD-10-CM | POA: Diagnosis not present

## 2018-11-01 DIAGNOSIS — I129 Hypertensive chronic kidney disease with stage 1 through stage 4 chronic kidney disease, or unspecified chronic kidney disease: Secondary | ICD-10-CM | POA: Diagnosis not present

## 2018-11-02 DIAGNOSIS — E1122 Type 2 diabetes mellitus with diabetic chronic kidney disease: Secondary | ICD-10-CM | POA: Diagnosis not present

## 2018-11-02 DIAGNOSIS — G301 Alzheimer's disease with late onset: Secondary | ICD-10-CM | POA: Diagnosis not present

## 2018-11-02 DIAGNOSIS — I129 Hypertensive chronic kidney disease with stage 1 through stage 4 chronic kidney disease, or unspecified chronic kidney disease: Secondary | ICD-10-CM | POA: Diagnosis not present

## 2018-11-02 DIAGNOSIS — F0280 Dementia in other diseases classified elsewhere without behavioral disturbance: Secondary | ICD-10-CM | POA: Diagnosis not present

## 2018-11-02 DIAGNOSIS — D649 Anemia, unspecified: Secondary | ICD-10-CM | POA: Diagnosis not present

## 2018-11-02 DIAGNOSIS — G20 Parkinson's disease: Secondary | ICD-10-CM | POA: Diagnosis not present

## 2018-11-03 DIAGNOSIS — N400 Enlarged prostate without lower urinary tract symptoms: Secondary | ICD-10-CM | POA: Diagnosis not present

## 2018-11-03 DIAGNOSIS — K219 Gastro-esophageal reflux disease without esophagitis: Secondary | ICD-10-CM | POA: Diagnosis not present

## 2018-11-03 DIAGNOSIS — F419 Anxiety disorder, unspecified: Secondary | ICD-10-CM | POA: Diagnosis not present

## 2018-11-03 DIAGNOSIS — F329 Major depressive disorder, single episode, unspecified: Secondary | ICD-10-CM | POA: Diagnosis not present

## 2018-11-03 DIAGNOSIS — G301 Alzheimer's disease with late onset: Secondary | ICD-10-CM | POA: Diagnosis not present

## 2018-11-03 DIAGNOSIS — N183 Chronic kidney disease, stage 3 unspecified: Secondary | ICD-10-CM | POA: Diagnosis not present

## 2018-11-03 DIAGNOSIS — Z8673 Personal history of transient ischemic attack (TIA), and cerebral infarction without residual deficits: Secondary | ICD-10-CM | POA: Diagnosis not present

## 2018-11-03 DIAGNOSIS — G20 Parkinson's disease: Secondary | ICD-10-CM | POA: Diagnosis not present

## 2018-11-03 DIAGNOSIS — G8929 Other chronic pain: Secondary | ICD-10-CM | POA: Diagnosis not present

## 2018-11-03 DIAGNOSIS — F0280 Dementia in other diseases classified elsewhere without behavioral disturbance: Secondary | ICD-10-CM | POA: Diagnosis not present

## 2018-11-03 DIAGNOSIS — I129 Hypertensive chronic kidney disease with stage 1 through stage 4 chronic kidney disease, or unspecified chronic kidney disease: Secondary | ICD-10-CM | POA: Diagnosis not present

## 2018-11-03 DIAGNOSIS — E1122 Type 2 diabetes mellitus with diabetic chronic kidney disease: Secondary | ICD-10-CM | POA: Diagnosis not present

## 2018-11-03 DIAGNOSIS — D649 Anemia, unspecified: Secondary | ICD-10-CM | POA: Diagnosis not present

## 2018-11-03 DIAGNOSIS — Z9181 History of falling: Secondary | ICD-10-CM | POA: Diagnosis not present

## 2018-11-05 DIAGNOSIS — D649 Anemia, unspecified: Secondary | ICD-10-CM | POA: Diagnosis not present

## 2018-11-05 DIAGNOSIS — I129 Hypertensive chronic kidney disease with stage 1 through stage 4 chronic kidney disease, or unspecified chronic kidney disease: Secondary | ICD-10-CM | POA: Diagnosis not present

## 2018-11-05 DIAGNOSIS — G301 Alzheimer's disease with late onset: Secondary | ICD-10-CM | POA: Diagnosis not present

## 2018-11-05 DIAGNOSIS — E1122 Type 2 diabetes mellitus with diabetic chronic kidney disease: Secondary | ICD-10-CM | POA: Diagnosis not present

## 2018-11-05 DIAGNOSIS — F0280 Dementia in other diseases classified elsewhere without behavioral disturbance: Secondary | ICD-10-CM | POA: Diagnosis not present

## 2018-11-05 DIAGNOSIS — G20 Parkinson's disease: Secondary | ICD-10-CM | POA: Diagnosis not present

## 2018-11-06 DIAGNOSIS — I129 Hypertensive chronic kidney disease with stage 1 through stage 4 chronic kidney disease, or unspecified chronic kidney disease: Secondary | ICD-10-CM | POA: Diagnosis not present

## 2018-11-06 DIAGNOSIS — G301 Alzheimer's disease with late onset: Secondary | ICD-10-CM | POA: Diagnosis not present

## 2018-11-06 DIAGNOSIS — F0280 Dementia in other diseases classified elsewhere without behavioral disturbance: Secondary | ICD-10-CM | POA: Diagnosis not present

## 2018-11-06 DIAGNOSIS — G20 Parkinson's disease: Secondary | ICD-10-CM | POA: Diagnosis not present

## 2018-11-06 DIAGNOSIS — E1122 Type 2 diabetes mellitus with diabetic chronic kidney disease: Secondary | ICD-10-CM | POA: Diagnosis not present

## 2018-11-06 DIAGNOSIS — D649 Anemia, unspecified: Secondary | ICD-10-CM | POA: Diagnosis not present

## 2018-11-07 DIAGNOSIS — E1122 Type 2 diabetes mellitus with diabetic chronic kidney disease: Secondary | ICD-10-CM | POA: Diagnosis not present

## 2018-11-07 DIAGNOSIS — D649 Anemia, unspecified: Secondary | ICD-10-CM | POA: Diagnosis not present

## 2018-11-07 DIAGNOSIS — G301 Alzheimer's disease with late onset: Secondary | ICD-10-CM | POA: Diagnosis not present

## 2018-11-07 DIAGNOSIS — F0280 Dementia in other diseases classified elsewhere without behavioral disturbance: Secondary | ICD-10-CM | POA: Diagnosis not present

## 2018-11-07 DIAGNOSIS — I129 Hypertensive chronic kidney disease with stage 1 through stage 4 chronic kidney disease, or unspecified chronic kidney disease: Secondary | ICD-10-CM | POA: Diagnosis not present

## 2018-11-07 DIAGNOSIS — G20 Parkinson's disease: Secondary | ICD-10-CM | POA: Diagnosis not present

## 2018-11-08 DIAGNOSIS — D649 Anemia, unspecified: Secondary | ICD-10-CM | POA: Diagnosis not present

## 2018-11-08 DIAGNOSIS — F0280 Dementia in other diseases classified elsewhere without behavioral disturbance: Secondary | ICD-10-CM | POA: Diagnosis not present

## 2018-11-08 DIAGNOSIS — G20 Parkinson's disease: Secondary | ICD-10-CM | POA: Diagnosis not present

## 2018-11-08 DIAGNOSIS — E1122 Type 2 diabetes mellitus with diabetic chronic kidney disease: Secondary | ICD-10-CM | POA: Diagnosis not present

## 2018-11-08 DIAGNOSIS — G301 Alzheimer's disease with late onset: Secondary | ICD-10-CM | POA: Diagnosis not present

## 2018-11-08 DIAGNOSIS — I129 Hypertensive chronic kidney disease with stage 1 through stage 4 chronic kidney disease, or unspecified chronic kidney disease: Secondary | ICD-10-CM | POA: Diagnosis not present

## 2018-11-09 DIAGNOSIS — G20 Parkinson's disease: Secondary | ICD-10-CM | POA: Diagnosis not present

## 2018-11-09 DIAGNOSIS — D649 Anemia, unspecified: Secondary | ICD-10-CM | POA: Diagnosis not present

## 2018-11-09 DIAGNOSIS — G301 Alzheimer's disease with late onset: Secondary | ICD-10-CM | POA: Diagnosis not present

## 2018-11-09 DIAGNOSIS — I129 Hypertensive chronic kidney disease with stage 1 through stage 4 chronic kidney disease, or unspecified chronic kidney disease: Secondary | ICD-10-CM | POA: Diagnosis not present

## 2018-11-09 DIAGNOSIS — F0280 Dementia in other diseases classified elsewhere without behavioral disturbance: Secondary | ICD-10-CM | POA: Diagnosis not present

## 2018-11-09 DIAGNOSIS — E1122 Type 2 diabetes mellitus with diabetic chronic kidney disease: Secondary | ICD-10-CM | POA: Diagnosis not present

## 2018-11-10 DIAGNOSIS — I129 Hypertensive chronic kidney disease with stage 1 through stage 4 chronic kidney disease, or unspecified chronic kidney disease: Secondary | ICD-10-CM | POA: Diagnosis not present

## 2018-11-10 DIAGNOSIS — G20 Parkinson's disease: Secondary | ICD-10-CM | POA: Diagnosis not present

## 2018-11-10 DIAGNOSIS — G301 Alzheimer's disease with late onset: Secondary | ICD-10-CM | POA: Diagnosis not present

## 2018-11-10 DIAGNOSIS — D649 Anemia, unspecified: Secondary | ICD-10-CM | POA: Diagnosis not present

## 2018-11-10 DIAGNOSIS — F0280 Dementia in other diseases classified elsewhere without behavioral disturbance: Secondary | ICD-10-CM | POA: Diagnosis not present

## 2018-11-10 DIAGNOSIS — E1122 Type 2 diabetes mellitus with diabetic chronic kidney disease: Secondary | ICD-10-CM | POA: Diagnosis not present

## 2018-11-11 DIAGNOSIS — E1122 Type 2 diabetes mellitus with diabetic chronic kidney disease: Secondary | ICD-10-CM | POA: Diagnosis not present

## 2018-11-11 DIAGNOSIS — F0280 Dementia in other diseases classified elsewhere without behavioral disturbance: Secondary | ICD-10-CM | POA: Diagnosis not present

## 2018-11-11 DIAGNOSIS — I129 Hypertensive chronic kidney disease with stage 1 through stage 4 chronic kidney disease, or unspecified chronic kidney disease: Secondary | ICD-10-CM | POA: Diagnosis not present

## 2018-11-11 DIAGNOSIS — D649 Anemia, unspecified: Secondary | ICD-10-CM | POA: Diagnosis not present

## 2018-11-11 DIAGNOSIS — G301 Alzheimer's disease with late onset: Secondary | ICD-10-CM | POA: Diagnosis not present

## 2018-11-11 DIAGNOSIS — G20 Parkinson's disease: Secondary | ICD-10-CM | POA: Diagnosis not present

## 2018-11-12 DIAGNOSIS — D649 Anemia, unspecified: Secondary | ICD-10-CM | POA: Diagnosis not present

## 2018-11-12 DIAGNOSIS — I129 Hypertensive chronic kidney disease with stage 1 through stage 4 chronic kidney disease, or unspecified chronic kidney disease: Secondary | ICD-10-CM | POA: Diagnosis not present

## 2018-11-12 DIAGNOSIS — E1122 Type 2 diabetes mellitus with diabetic chronic kidney disease: Secondary | ICD-10-CM | POA: Diagnosis not present

## 2018-11-12 DIAGNOSIS — G20 Parkinson's disease: Secondary | ICD-10-CM | POA: Diagnosis not present

## 2018-11-12 DIAGNOSIS — G301 Alzheimer's disease with late onset: Secondary | ICD-10-CM | POA: Diagnosis not present

## 2018-11-12 DIAGNOSIS — F0280 Dementia in other diseases classified elsewhere without behavioral disturbance: Secondary | ICD-10-CM | POA: Diagnosis not present

## 2018-11-13 DIAGNOSIS — E1122 Type 2 diabetes mellitus with diabetic chronic kidney disease: Secondary | ICD-10-CM | POA: Diagnosis not present

## 2018-11-13 DIAGNOSIS — G301 Alzheimer's disease with late onset: Secondary | ICD-10-CM | POA: Diagnosis not present

## 2018-11-13 DIAGNOSIS — G20 Parkinson's disease: Secondary | ICD-10-CM | POA: Diagnosis not present

## 2018-11-13 DIAGNOSIS — I129 Hypertensive chronic kidney disease with stage 1 through stage 4 chronic kidney disease, or unspecified chronic kidney disease: Secondary | ICD-10-CM | POA: Diagnosis not present

## 2018-11-13 DIAGNOSIS — D649 Anemia, unspecified: Secondary | ICD-10-CM | POA: Diagnosis not present

## 2018-11-13 DIAGNOSIS — F0280 Dementia in other diseases classified elsewhere without behavioral disturbance: Secondary | ICD-10-CM | POA: Diagnosis not present

## 2018-11-14 DIAGNOSIS — E1122 Type 2 diabetes mellitus with diabetic chronic kidney disease: Secondary | ICD-10-CM | POA: Diagnosis not present

## 2018-11-14 DIAGNOSIS — I129 Hypertensive chronic kidney disease with stage 1 through stage 4 chronic kidney disease, or unspecified chronic kidney disease: Secondary | ICD-10-CM | POA: Diagnosis not present

## 2018-11-14 DIAGNOSIS — G301 Alzheimer's disease with late onset: Secondary | ICD-10-CM | POA: Diagnosis not present

## 2018-11-14 DIAGNOSIS — D649 Anemia, unspecified: Secondary | ICD-10-CM | POA: Diagnosis not present

## 2018-11-14 DIAGNOSIS — G20 Parkinson's disease: Secondary | ICD-10-CM | POA: Diagnosis not present

## 2018-11-14 DIAGNOSIS — F0280 Dementia in other diseases classified elsewhere without behavioral disturbance: Secondary | ICD-10-CM | POA: Diagnosis not present

## 2018-11-15 DIAGNOSIS — G20 Parkinson's disease: Secondary | ICD-10-CM | POA: Diagnosis not present

## 2018-11-15 DIAGNOSIS — I129 Hypertensive chronic kidney disease with stage 1 through stage 4 chronic kidney disease, or unspecified chronic kidney disease: Secondary | ICD-10-CM | POA: Diagnosis not present

## 2018-11-15 DIAGNOSIS — E1122 Type 2 diabetes mellitus with diabetic chronic kidney disease: Secondary | ICD-10-CM | POA: Diagnosis not present

## 2018-11-15 DIAGNOSIS — F0280 Dementia in other diseases classified elsewhere without behavioral disturbance: Secondary | ICD-10-CM | POA: Diagnosis not present

## 2018-11-15 DIAGNOSIS — G301 Alzheimer's disease with late onset: Secondary | ICD-10-CM | POA: Diagnosis not present

## 2018-11-15 DIAGNOSIS — D649 Anemia, unspecified: Secondary | ICD-10-CM | POA: Diagnosis not present

## 2018-11-16 DIAGNOSIS — E1122 Type 2 diabetes mellitus with diabetic chronic kidney disease: Secondary | ICD-10-CM | POA: Diagnosis not present

## 2018-11-16 DIAGNOSIS — F0280 Dementia in other diseases classified elsewhere without behavioral disturbance: Secondary | ICD-10-CM | POA: Diagnosis not present

## 2018-11-16 DIAGNOSIS — G20 Parkinson's disease: Secondary | ICD-10-CM | POA: Diagnosis not present

## 2018-11-16 DIAGNOSIS — D649 Anemia, unspecified: Secondary | ICD-10-CM | POA: Diagnosis not present

## 2018-11-16 DIAGNOSIS — G301 Alzheimer's disease with late onset: Secondary | ICD-10-CM | POA: Diagnosis not present

## 2018-11-16 DIAGNOSIS — I129 Hypertensive chronic kidney disease with stage 1 through stage 4 chronic kidney disease, or unspecified chronic kidney disease: Secondary | ICD-10-CM | POA: Diagnosis not present

## 2018-11-17 DIAGNOSIS — G20 Parkinson's disease: Secondary | ICD-10-CM | POA: Diagnosis not present

## 2018-11-17 DIAGNOSIS — E1122 Type 2 diabetes mellitus with diabetic chronic kidney disease: Secondary | ICD-10-CM | POA: Diagnosis not present

## 2018-11-17 DIAGNOSIS — G301 Alzheimer's disease with late onset: Secondary | ICD-10-CM | POA: Diagnosis not present

## 2018-11-17 DIAGNOSIS — F0280 Dementia in other diseases classified elsewhere without behavioral disturbance: Secondary | ICD-10-CM | POA: Diagnosis not present

## 2018-11-17 DIAGNOSIS — I129 Hypertensive chronic kidney disease with stage 1 through stage 4 chronic kidney disease, or unspecified chronic kidney disease: Secondary | ICD-10-CM | POA: Diagnosis not present

## 2018-11-17 DIAGNOSIS — D649 Anemia, unspecified: Secondary | ICD-10-CM | POA: Diagnosis not present

## 2018-11-18 DIAGNOSIS — F0280 Dementia in other diseases classified elsewhere without behavioral disturbance: Secondary | ICD-10-CM | POA: Diagnosis not present

## 2018-11-18 DIAGNOSIS — G20 Parkinson's disease: Secondary | ICD-10-CM | POA: Diagnosis not present

## 2018-11-18 DIAGNOSIS — I129 Hypertensive chronic kidney disease with stage 1 through stage 4 chronic kidney disease, or unspecified chronic kidney disease: Secondary | ICD-10-CM | POA: Diagnosis not present

## 2018-11-18 DIAGNOSIS — G301 Alzheimer's disease with late onset: Secondary | ICD-10-CM | POA: Diagnosis not present

## 2018-11-18 DIAGNOSIS — D649 Anemia, unspecified: Secondary | ICD-10-CM | POA: Diagnosis not present

## 2018-11-18 DIAGNOSIS — E1122 Type 2 diabetes mellitus with diabetic chronic kidney disease: Secondary | ICD-10-CM | POA: Diagnosis not present

## 2018-11-19 DIAGNOSIS — D649 Anemia, unspecified: Secondary | ICD-10-CM | POA: Diagnosis not present

## 2018-11-19 DIAGNOSIS — G301 Alzheimer's disease with late onset: Secondary | ICD-10-CM | POA: Diagnosis not present

## 2018-11-19 DIAGNOSIS — G20 Parkinson's disease: Secondary | ICD-10-CM | POA: Diagnosis not present

## 2018-11-19 DIAGNOSIS — I129 Hypertensive chronic kidney disease with stage 1 through stage 4 chronic kidney disease, or unspecified chronic kidney disease: Secondary | ICD-10-CM | POA: Diagnosis not present

## 2018-11-19 DIAGNOSIS — F0280 Dementia in other diseases classified elsewhere without behavioral disturbance: Secondary | ICD-10-CM | POA: Diagnosis not present

## 2018-11-19 DIAGNOSIS — E1122 Type 2 diabetes mellitus with diabetic chronic kidney disease: Secondary | ICD-10-CM | POA: Diagnosis not present

## 2018-11-20 DIAGNOSIS — I129 Hypertensive chronic kidney disease with stage 1 through stage 4 chronic kidney disease, or unspecified chronic kidney disease: Secondary | ICD-10-CM | POA: Diagnosis not present

## 2018-11-20 DIAGNOSIS — G20 Parkinson's disease: Secondary | ICD-10-CM | POA: Diagnosis not present

## 2018-11-20 DIAGNOSIS — F0280 Dementia in other diseases classified elsewhere without behavioral disturbance: Secondary | ICD-10-CM | POA: Diagnosis not present

## 2018-11-20 DIAGNOSIS — E1122 Type 2 diabetes mellitus with diabetic chronic kidney disease: Secondary | ICD-10-CM | POA: Diagnosis not present

## 2018-11-20 DIAGNOSIS — G301 Alzheimer's disease with late onset: Secondary | ICD-10-CM | POA: Diagnosis not present

## 2018-11-20 DIAGNOSIS — D649 Anemia, unspecified: Secondary | ICD-10-CM | POA: Diagnosis not present

## 2018-11-21 DIAGNOSIS — G20 Parkinson's disease: Secondary | ICD-10-CM | POA: Diagnosis not present

## 2018-11-21 DIAGNOSIS — F0280 Dementia in other diseases classified elsewhere without behavioral disturbance: Secondary | ICD-10-CM | POA: Diagnosis not present

## 2018-11-21 DIAGNOSIS — E1122 Type 2 diabetes mellitus with diabetic chronic kidney disease: Secondary | ICD-10-CM | POA: Diagnosis not present

## 2018-11-21 DIAGNOSIS — G301 Alzheimer's disease with late onset: Secondary | ICD-10-CM | POA: Diagnosis not present

## 2018-11-21 DIAGNOSIS — I129 Hypertensive chronic kidney disease with stage 1 through stage 4 chronic kidney disease, or unspecified chronic kidney disease: Secondary | ICD-10-CM | POA: Diagnosis not present

## 2018-11-21 DIAGNOSIS — D649 Anemia, unspecified: Secondary | ICD-10-CM | POA: Diagnosis not present

## 2018-11-22 DIAGNOSIS — G301 Alzheimer's disease with late onset: Secondary | ICD-10-CM | POA: Diagnosis not present

## 2018-11-22 DIAGNOSIS — G20 Parkinson's disease: Secondary | ICD-10-CM | POA: Diagnosis not present

## 2018-11-22 DIAGNOSIS — D649 Anemia, unspecified: Secondary | ICD-10-CM | POA: Diagnosis not present

## 2018-11-22 DIAGNOSIS — E1122 Type 2 diabetes mellitus with diabetic chronic kidney disease: Secondary | ICD-10-CM | POA: Diagnosis not present

## 2018-11-22 DIAGNOSIS — F0280 Dementia in other diseases classified elsewhere without behavioral disturbance: Secondary | ICD-10-CM | POA: Diagnosis not present

## 2018-11-22 DIAGNOSIS — I129 Hypertensive chronic kidney disease with stage 1 through stage 4 chronic kidney disease, or unspecified chronic kidney disease: Secondary | ICD-10-CM | POA: Diagnosis not present

## 2018-11-23 DIAGNOSIS — E1122 Type 2 diabetes mellitus with diabetic chronic kidney disease: Secondary | ICD-10-CM | POA: Diagnosis not present

## 2018-11-23 DIAGNOSIS — G20 Parkinson's disease: Secondary | ICD-10-CM | POA: Diagnosis not present

## 2018-11-23 DIAGNOSIS — D649 Anemia, unspecified: Secondary | ICD-10-CM | POA: Diagnosis not present

## 2018-11-23 DIAGNOSIS — G301 Alzheimer's disease with late onset: Secondary | ICD-10-CM | POA: Diagnosis not present

## 2018-11-23 DIAGNOSIS — I129 Hypertensive chronic kidney disease with stage 1 through stage 4 chronic kidney disease, or unspecified chronic kidney disease: Secondary | ICD-10-CM | POA: Diagnosis not present

## 2018-11-23 DIAGNOSIS — F0280 Dementia in other diseases classified elsewhere without behavioral disturbance: Secondary | ICD-10-CM | POA: Diagnosis not present

## 2018-11-24 DIAGNOSIS — E1122 Type 2 diabetes mellitus with diabetic chronic kidney disease: Secondary | ICD-10-CM | POA: Diagnosis not present

## 2018-11-24 DIAGNOSIS — G301 Alzheimer's disease with late onset: Secondary | ICD-10-CM | POA: Diagnosis not present

## 2018-11-24 DIAGNOSIS — I129 Hypertensive chronic kidney disease with stage 1 through stage 4 chronic kidney disease, or unspecified chronic kidney disease: Secondary | ICD-10-CM | POA: Diagnosis not present

## 2018-11-24 DIAGNOSIS — F0280 Dementia in other diseases classified elsewhere without behavioral disturbance: Secondary | ICD-10-CM | POA: Diagnosis not present

## 2018-11-24 DIAGNOSIS — D649 Anemia, unspecified: Secondary | ICD-10-CM | POA: Diagnosis not present

## 2018-11-24 DIAGNOSIS — G20 Parkinson's disease: Secondary | ICD-10-CM | POA: Diagnosis not present

## 2018-11-25 DIAGNOSIS — G301 Alzheimer's disease with late onset: Secondary | ICD-10-CM | POA: Diagnosis not present

## 2018-11-25 DIAGNOSIS — G20 Parkinson's disease: Secondary | ICD-10-CM | POA: Diagnosis not present

## 2018-11-25 DIAGNOSIS — I129 Hypertensive chronic kidney disease with stage 1 through stage 4 chronic kidney disease, or unspecified chronic kidney disease: Secondary | ICD-10-CM | POA: Diagnosis not present

## 2018-11-25 DIAGNOSIS — D649 Anemia, unspecified: Secondary | ICD-10-CM | POA: Diagnosis not present

## 2018-11-25 DIAGNOSIS — E1122 Type 2 diabetes mellitus with diabetic chronic kidney disease: Secondary | ICD-10-CM | POA: Diagnosis not present

## 2018-11-25 DIAGNOSIS — F0280 Dementia in other diseases classified elsewhere without behavioral disturbance: Secondary | ICD-10-CM | POA: Diagnosis not present

## 2018-11-26 DIAGNOSIS — E1122 Type 2 diabetes mellitus with diabetic chronic kidney disease: Secondary | ICD-10-CM | POA: Diagnosis not present

## 2018-11-26 DIAGNOSIS — G301 Alzheimer's disease with late onset: Secondary | ICD-10-CM | POA: Diagnosis not present

## 2018-11-26 DIAGNOSIS — D649 Anemia, unspecified: Secondary | ICD-10-CM | POA: Diagnosis not present

## 2018-11-26 DIAGNOSIS — I129 Hypertensive chronic kidney disease with stage 1 through stage 4 chronic kidney disease, or unspecified chronic kidney disease: Secondary | ICD-10-CM | POA: Diagnosis not present

## 2018-11-26 DIAGNOSIS — G20 Parkinson's disease: Secondary | ICD-10-CM | POA: Diagnosis not present

## 2018-11-26 DIAGNOSIS — F0280 Dementia in other diseases classified elsewhere without behavioral disturbance: Secondary | ICD-10-CM | POA: Diagnosis not present

## 2018-11-27 DIAGNOSIS — G20 Parkinson's disease: Secondary | ICD-10-CM | POA: Diagnosis not present

## 2018-11-27 DIAGNOSIS — F0280 Dementia in other diseases classified elsewhere without behavioral disturbance: Secondary | ICD-10-CM | POA: Diagnosis not present

## 2018-11-27 DIAGNOSIS — D649 Anemia, unspecified: Secondary | ICD-10-CM | POA: Diagnosis not present

## 2018-11-27 DIAGNOSIS — E1122 Type 2 diabetes mellitus with diabetic chronic kidney disease: Secondary | ICD-10-CM | POA: Diagnosis not present

## 2018-11-27 DIAGNOSIS — I129 Hypertensive chronic kidney disease with stage 1 through stage 4 chronic kidney disease, or unspecified chronic kidney disease: Secondary | ICD-10-CM | POA: Diagnosis not present

## 2018-11-27 DIAGNOSIS — G301 Alzheimer's disease with late onset: Secondary | ICD-10-CM | POA: Diagnosis not present

## 2018-11-28 DIAGNOSIS — I129 Hypertensive chronic kidney disease with stage 1 through stage 4 chronic kidney disease, or unspecified chronic kidney disease: Secondary | ICD-10-CM | POA: Diagnosis not present

## 2018-11-28 DIAGNOSIS — G301 Alzheimer's disease with late onset: Secondary | ICD-10-CM | POA: Diagnosis not present

## 2018-11-28 DIAGNOSIS — F0280 Dementia in other diseases classified elsewhere without behavioral disturbance: Secondary | ICD-10-CM | POA: Diagnosis not present

## 2018-11-28 DIAGNOSIS — G20 Parkinson's disease: Secondary | ICD-10-CM | POA: Diagnosis not present

## 2018-11-28 DIAGNOSIS — D649 Anemia, unspecified: Secondary | ICD-10-CM | POA: Diagnosis not present

## 2018-11-28 DIAGNOSIS — E1122 Type 2 diabetes mellitus with diabetic chronic kidney disease: Secondary | ICD-10-CM | POA: Diagnosis not present

## 2018-11-29 DIAGNOSIS — G20 Parkinson's disease: Secondary | ICD-10-CM | POA: Diagnosis not present

## 2018-11-29 DIAGNOSIS — D649 Anemia, unspecified: Secondary | ICD-10-CM | POA: Diagnosis not present

## 2018-11-29 DIAGNOSIS — I129 Hypertensive chronic kidney disease with stage 1 through stage 4 chronic kidney disease, or unspecified chronic kidney disease: Secondary | ICD-10-CM | POA: Diagnosis not present

## 2018-11-29 DIAGNOSIS — G301 Alzheimer's disease with late onset: Secondary | ICD-10-CM | POA: Diagnosis not present

## 2018-11-29 DIAGNOSIS — F0280 Dementia in other diseases classified elsewhere without behavioral disturbance: Secondary | ICD-10-CM | POA: Diagnosis not present

## 2018-11-29 DIAGNOSIS — E1122 Type 2 diabetes mellitus with diabetic chronic kidney disease: Secondary | ICD-10-CM | POA: Diagnosis not present

## 2018-11-30 DIAGNOSIS — E1122 Type 2 diabetes mellitus with diabetic chronic kidney disease: Secondary | ICD-10-CM | POA: Diagnosis not present

## 2018-11-30 DIAGNOSIS — F0280 Dementia in other diseases classified elsewhere without behavioral disturbance: Secondary | ICD-10-CM | POA: Diagnosis not present

## 2018-11-30 DIAGNOSIS — G301 Alzheimer's disease with late onset: Secondary | ICD-10-CM | POA: Diagnosis not present

## 2018-11-30 DIAGNOSIS — D649 Anemia, unspecified: Secondary | ICD-10-CM | POA: Diagnosis not present

## 2018-11-30 DIAGNOSIS — I129 Hypertensive chronic kidney disease with stage 1 through stage 4 chronic kidney disease, or unspecified chronic kidney disease: Secondary | ICD-10-CM | POA: Diagnosis not present

## 2018-11-30 DIAGNOSIS — G20 Parkinson's disease: Secondary | ICD-10-CM | POA: Diagnosis not present

## 2018-12-01 DIAGNOSIS — G20 Parkinson's disease: Secondary | ICD-10-CM | POA: Diagnosis not present

## 2018-12-01 DIAGNOSIS — G301 Alzheimer's disease with late onset: Secondary | ICD-10-CM | POA: Diagnosis not present

## 2018-12-01 DIAGNOSIS — D649 Anemia, unspecified: Secondary | ICD-10-CM | POA: Diagnosis not present

## 2018-12-01 DIAGNOSIS — E1122 Type 2 diabetes mellitus with diabetic chronic kidney disease: Secondary | ICD-10-CM | POA: Diagnosis not present

## 2018-12-01 DIAGNOSIS — F0280 Dementia in other diseases classified elsewhere without behavioral disturbance: Secondary | ICD-10-CM | POA: Diagnosis not present

## 2018-12-01 DIAGNOSIS — I129 Hypertensive chronic kidney disease with stage 1 through stage 4 chronic kidney disease, or unspecified chronic kidney disease: Secondary | ICD-10-CM | POA: Diagnosis not present

## 2018-12-02 DIAGNOSIS — G301 Alzheimer's disease with late onset: Secondary | ICD-10-CM | POA: Diagnosis not present

## 2018-12-02 DIAGNOSIS — E1122 Type 2 diabetes mellitus with diabetic chronic kidney disease: Secondary | ICD-10-CM | POA: Diagnosis not present

## 2018-12-02 DIAGNOSIS — I129 Hypertensive chronic kidney disease with stage 1 through stage 4 chronic kidney disease, or unspecified chronic kidney disease: Secondary | ICD-10-CM | POA: Diagnosis not present

## 2018-12-02 DIAGNOSIS — G20 Parkinson's disease: Secondary | ICD-10-CM | POA: Diagnosis not present

## 2018-12-02 DIAGNOSIS — D649 Anemia, unspecified: Secondary | ICD-10-CM | POA: Diagnosis not present

## 2018-12-02 DIAGNOSIS — F0280 Dementia in other diseases classified elsewhere without behavioral disturbance: Secondary | ICD-10-CM | POA: Diagnosis not present

## 2018-12-03 DIAGNOSIS — I129 Hypertensive chronic kidney disease with stage 1 through stage 4 chronic kidney disease, or unspecified chronic kidney disease: Secondary | ICD-10-CM | POA: Diagnosis not present

## 2018-12-03 DIAGNOSIS — G301 Alzheimer's disease with late onset: Secondary | ICD-10-CM | POA: Diagnosis not present

## 2018-12-03 DIAGNOSIS — D649 Anemia, unspecified: Secondary | ICD-10-CM | POA: Diagnosis not present

## 2018-12-03 DIAGNOSIS — E1122 Type 2 diabetes mellitus with diabetic chronic kidney disease: Secondary | ICD-10-CM | POA: Diagnosis not present

## 2018-12-03 DIAGNOSIS — G20 Parkinson's disease: Secondary | ICD-10-CM | POA: Diagnosis not present

## 2018-12-03 DIAGNOSIS — F0280 Dementia in other diseases classified elsewhere without behavioral disturbance: Secondary | ICD-10-CM | POA: Diagnosis not present

## 2018-12-04 DIAGNOSIS — F419 Anxiety disorder, unspecified: Secondary | ICD-10-CM | POA: Diagnosis not present

## 2018-12-04 DIAGNOSIS — G301 Alzheimer's disease with late onset: Secondary | ICD-10-CM | POA: Diagnosis not present

## 2018-12-04 DIAGNOSIS — G8929 Other chronic pain: Secondary | ICD-10-CM | POA: Diagnosis not present

## 2018-12-04 DIAGNOSIS — K219 Gastro-esophageal reflux disease without esophagitis: Secondary | ICD-10-CM | POA: Diagnosis not present

## 2018-12-04 DIAGNOSIS — N183 Chronic kidney disease, stage 3 unspecified: Secondary | ICD-10-CM | POA: Diagnosis not present

## 2018-12-04 DIAGNOSIS — F0280 Dementia in other diseases classified elsewhere without behavioral disturbance: Secondary | ICD-10-CM | POA: Diagnosis not present

## 2018-12-04 DIAGNOSIS — Z9181 History of falling: Secondary | ICD-10-CM | POA: Diagnosis not present

## 2018-12-04 DIAGNOSIS — I129 Hypertensive chronic kidney disease with stage 1 through stage 4 chronic kidney disease, or unspecified chronic kidney disease: Secondary | ICD-10-CM | POA: Diagnosis not present

## 2018-12-04 DIAGNOSIS — F329 Major depressive disorder, single episode, unspecified: Secondary | ICD-10-CM | POA: Diagnosis not present

## 2018-12-04 DIAGNOSIS — D649 Anemia, unspecified: Secondary | ICD-10-CM | POA: Diagnosis not present

## 2018-12-04 DIAGNOSIS — N400 Enlarged prostate without lower urinary tract symptoms: Secondary | ICD-10-CM | POA: Diagnosis not present

## 2018-12-04 DIAGNOSIS — E1122 Type 2 diabetes mellitus with diabetic chronic kidney disease: Secondary | ICD-10-CM | POA: Diagnosis not present

## 2018-12-04 DIAGNOSIS — G20 Parkinson's disease: Secondary | ICD-10-CM | POA: Diagnosis not present

## 2018-12-04 DIAGNOSIS — Z8673 Personal history of transient ischemic attack (TIA), and cerebral infarction without residual deficits: Secondary | ICD-10-CM | POA: Diagnosis not present

## 2018-12-05 DIAGNOSIS — F0280 Dementia in other diseases classified elsewhere without behavioral disturbance: Secondary | ICD-10-CM | POA: Diagnosis not present

## 2018-12-05 DIAGNOSIS — G20 Parkinson's disease: Secondary | ICD-10-CM | POA: Diagnosis not present

## 2018-12-05 DIAGNOSIS — I129 Hypertensive chronic kidney disease with stage 1 through stage 4 chronic kidney disease, or unspecified chronic kidney disease: Secondary | ICD-10-CM | POA: Diagnosis not present

## 2018-12-05 DIAGNOSIS — E1122 Type 2 diabetes mellitus with diabetic chronic kidney disease: Secondary | ICD-10-CM | POA: Diagnosis not present

## 2018-12-05 DIAGNOSIS — G301 Alzheimer's disease with late onset: Secondary | ICD-10-CM | POA: Diagnosis not present

## 2018-12-05 DIAGNOSIS — D649 Anemia, unspecified: Secondary | ICD-10-CM | POA: Diagnosis not present

## 2018-12-06 DIAGNOSIS — F0280 Dementia in other diseases classified elsewhere without behavioral disturbance: Secondary | ICD-10-CM | POA: Diagnosis not present

## 2018-12-06 DIAGNOSIS — G301 Alzheimer's disease with late onset: Secondary | ICD-10-CM | POA: Diagnosis not present

## 2018-12-06 DIAGNOSIS — G20 Parkinson's disease: Secondary | ICD-10-CM | POA: Diagnosis not present

## 2018-12-06 DIAGNOSIS — D649 Anemia, unspecified: Secondary | ICD-10-CM | POA: Diagnosis not present

## 2018-12-06 DIAGNOSIS — I129 Hypertensive chronic kidney disease with stage 1 through stage 4 chronic kidney disease, or unspecified chronic kidney disease: Secondary | ICD-10-CM | POA: Diagnosis not present

## 2018-12-06 DIAGNOSIS — E1122 Type 2 diabetes mellitus with diabetic chronic kidney disease: Secondary | ICD-10-CM | POA: Diagnosis not present

## 2018-12-07 DIAGNOSIS — I129 Hypertensive chronic kidney disease with stage 1 through stage 4 chronic kidney disease, or unspecified chronic kidney disease: Secondary | ICD-10-CM | POA: Diagnosis not present

## 2018-12-07 DIAGNOSIS — E1122 Type 2 diabetes mellitus with diabetic chronic kidney disease: Secondary | ICD-10-CM | POA: Diagnosis not present

## 2018-12-07 DIAGNOSIS — G301 Alzheimer's disease with late onset: Secondary | ICD-10-CM | POA: Diagnosis not present

## 2018-12-07 DIAGNOSIS — D649 Anemia, unspecified: Secondary | ICD-10-CM | POA: Diagnosis not present

## 2018-12-07 DIAGNOSIS — G20 Parkinson's disease: Secondary | ICD-10-CM | POA: Diagnosis not present

## 2018-12-07 DIAGNOSIS — F0280 Dementia in other diseases classified elsewhere without behavioral disturbance: Secondary | ICD-10-CM | POA: Diagnosis not present

## 2018-12-08 DIAGNOSIS — G301 Alzheimer's disease with late onset: Secondary | ICD-10-CM | POA: Diagnosis not present

## 2018-12-08 DIAGNOSIS — G20 Parkinson's disease: Secondary | ICD-10-CM | POA: Diagnosis not present

## 2018-12-08 DIAGNOSIS — I129 Hypertensive chronic kidney disease with stage 1 through stage 4 chronic kidney disease, or unspecified chronic kidney disease: Secondary | ICD-10-CM | POA: Diagnosis not present

## 2018-12-08 DIAGNOSIS — D649 Anemia, unspecified: Secondary | ICD-10-CM | POA: Diagnosis not present

## 2018-12-08 DIAGNOSIS — E1122 Type 2 diabetes mellitus with diabetic chronic kidney disease: Secondary | ICD-10-CM | POA: Diagnosis not present

## 2018-12-08 DIAGNOSIS — F0280 Dementia in other diseases classified elsewhere without behavioral disturbance: Secondary | ICD-10-CM | POA: Diagnosis not present

## 2018-12-09 DIAGNOSIS — D649 Anemia, unspecified: Secondary | ICD-10-CM | POA: Diagnosis not present

## 2018-12-09 DIAGNOSIS — F0280 Dementia in other diseases classified elsewhere without behavioral disturbance: Secondary | ICD-10-CM | POA: Diagnosis not present

## 2018-12-09 DIAGNOSIS — G301 Alzheimer's disease with late onset: Secondary | ICD-10-CM | POA: Diagnosis not present

## 2018-12-09 DIAGNOSIS — E1122 Type 2 diabetes mellitus with diabetic chronic kidney disease: Secondary | ICD-10-CM | POA: Diagnosis not present

## 2018-12-09 DIAGNOSIS — I129 Hypertensive chronic kidney disease with stage 1 through stage 4 chronic kidney disease, or unspecified chronic kidney disease: Secondary | ICD-10-CM | POA: Diagnosis not present

## 2018-12-09 DIAGNOSIS — G20 Parkinson's disease: Secondary | ICD-10-CM | POA: Diagnosis not present

## 2018-12-10 DIAGNOSIS — I129 Hypertensive chronic kidney disease with stage 1 through stage 4 chronic kidney disease, or unspecified chronic kidney disease: Secondary | ICD-10-CM | POA: Diagnosis not present

## 2018-12-10 DIAGNOSIS — D649 Anemia, unspecified: Secondary | ICD-10-CM | POA: Diagnosis not present

## 2018-12-10 DIAGNOSIS — G20 Parkinson's disease: Secondary | ICD-10-CM | POA: Diagnosis not present

## 2018-12-10 DIAGNOSIS — F0280 Dementia in other diseases classified elsewhere without behavioral disturbance: Secondary | ICD-10-CM | POA: Diagnosis not present

## 2018-12-10 DIAGNOSIS — G301 Alzheimer's disease with late onset: Secondary | ICD-10-CM | POA: Diagnosis not present

## 2018-12-10 DIAGNOSIS — E1122 Type 2 diabetes mellitus with diabetic chronic kidney disease: Secondary | ICD-10-CM | POA: Diagnosis not present

## 2018-12-11 DIAGNOSIS — G301 Alzheimer's disease with late onset: Secondary | ICD-10-CM | POA: Diagnosis not present

## 2018-12-11 DIAGNOSIS — F0280 Dementia in other diseases classified elsewhere without behavioral disturbance: Secondary | ICD-10-CM | POA: Diagnosis not present

## 2018-12-11 DIAGNOSIS — E1122 Type 2 diabetes mellitus with diabetic chronic kidney disease: Secondary | ICD-10-CM | POA: Diagnosis not present

## 2018-12-11 DIAGNOSIS — I129 Hypertensive chronic kidney disease with stage 1 through stage 4 chronic kidney disease, or unspecified chronic kidney disease: Secondary | ICD-10-CM | POA: Diagnosis not present

## 2018-12-11 DIAGNOSIS — G20 Parkinson's disease: Secondary | ICD-10-CM | POA: Diagnosis not present

## 2018-12-11 DIAGNOSIS — D649 Anemia, unspecified: Secondary | ICD-10-CM | POA: Diagnosis not present

## 2018-12-12 DIAGNOSIS — F0280 Dementia in other diseases classified elsewhere without behavioral disturbance: Secondary | ICD-10-CM | POA: Diagnosis not present

## 2018-12-12 DIAGNOSIS — E1122 Type 2 diabetes mellitus with diabetic chronic kidney disease: Secondary | ICD-10-CM | POA: Diagnosis not present

## 2018-12-12 DIAGNOSIS — G20 Parkinson's disease: Secondary | ICD-10-CM | POA: Diagnosis not present

## 2018-12-12 DIAGNOSIS — G301 Alzheimer's disease with late onset: Secondary | ICD-10-CM | POA: Diagnosis not present

## 2018-12-12 DIAGNOSIS — D649 Anemia, unspecified: Secondary | ICD-10-CM | POA: Diagnosis not present

## 2018-12-12 DIAGNOSIS — I129 Hypertensive chronic kidney disease with stage 1 through stage 4 chronic kidney disease, or unspecified chronic kidney disease: Secondary | ICD-10-CM | POA: Diagnosis not present

## 2018-12-13 DIAGNOSIS — F0280 Dementia in other diseases classified elsewhere without behavioral disturbance: Secondary | ICD-10-CM | POA: Diagnosis not present

## 2018-12-13 DIAGNOSIS — I129 Hypertensive chronic kidney disease with stage 1 through stage 4 chronic kidney disease, or unspecified chronic kidney disease: Secondary | ICD-10-CM | POA: Diagnosis not present

## 2018-12-13 DIAGNOSIS — D649 Anemia, unspecified: Secondary | ICD-10-CM | POA: Diagnosis not present

## 2018-12-13 DIAGNOSIS — G301 Alzheimer's disease with late onset: Secondary | ICD-10-CM | POA: Diagnosis not present

## 2018-12-13 DIAGNOSIS — G20 Parkinson's disease: Secondary | ICD-10-CM | POA: Diagnosis not present

## 2018-12-13 DIAGNOSIS — E1122 Type 2 diabetes mellitus with diabetic chronic kidney disease: Secondary | ICD-10-CM | POA: Diagnosis not present

## 2018-12-14 DIAGNOSIS — D649 Anemia, unspecified: Secondary | ICD-10-CM | POA: Diagnosis not present

## 2018-12-14 DIAGNOSIS — G20 Parkinson's disease: Secondary | ICD-10-CM | POA: Diagnosis not present

## 2018-12-14 DIAGNOSIS — I129 Hypertensive chronic kidney disease with stage 1 through stage 4 chronic kidney disease, or unspecified chronic kidney disease: Secondary | ICD-10-CM | POA: Diagnosis not present

## 2018-12-14 DIAGNOSIS — E1122 Type 2 diabetes mellitus with diabetic chronic kidney disease: Secondary | ICD-10-CM | POA: Diagnosis not present

## 2018-12-14 DIAGNOSIS — F0280 Dementia in other diseases classified elsewhere without behavioral disturbance: Secondary | ICD-10-CM | POA: Diagnosis not present

## 2018-12-14 DIAGNOSIS — G301 Alzheimer's disease with late onset: Secondary | ICD-10-CM | POA: Diagnosis not present

## 2018-12-15 DIAGNOSIS — G20 Parkinson's disease: Secondary | ICD-10-CM | POA: Diagnosis not present

## 2018-12-15 DIAGNOSIS — I129 Hypertensive chronic kidney disease with stage 1 through stage 4 chronic kidney disease, or unspecified chronic kidney disease: Secondary | ICD-10-CM | POA: Diagnosis not present

## 2018-12-15 DIAGNOSIS — F0280 Dementia in other diseases classified elsewhere without behavioral disturbance: Secondary | ICD-10-CM | POA: Diagnosis not present

## 2018-12-15 DIAGNOSIS — G301 Alzheimer's disease with late onset: Secondary | ICD-10-CM | POA: Diagnosis not present

## 2018-12-15 DIAGNOSIS — E1122 Type 2 diabetes mellitus with diabetic chronic kidney disease: Secondary | ICD-10-CM | POA: Diagnosis not present

## 2018-12-15 DIAGNOSIS — D649 Anemia, unspecified: Secondary | ICD-10-CM | POA: Diagnosis not present

## 2018-12-16 DIAGNOSIS — D649 Anemia, unspecified: Secondary | ICD-10-CM | POA: Diagnosis not present

## 2018-12-16 DIAGNOSIS — I129 Hypertensive chronic kidney disease with stage 1 through stage 4 chronic kidney disease, or unspecified chronic kidney disease: Secondary | ICD-10-CM | POA: Diagnosis not present

## 2018-12-16 DIAGNOSIS — E1122 Type 2 diabetes mellitus with diabetic chronic kidney disease: Secondary | ICD-10-CM | POA: Diagnosis not present

## 2018-12-16 DIAGNOSIS — G20 Parkinson's disease: Secondary | ICD-10-CM | POA: Diagnosis not present

## 2018-12-16 DIAGNOSIS — F0280 Dementia in other diseases classified elsewhere without behavioral disturbance: Secondary | ICD-10-CM | POA: Diagnosis not present

## 2018-12-16 DIAGNOSIS — G301 Alzheimer's disease with late onset: Secondary | ICD-10-CM | POA: Diagnosis not present

## 2018-12-17 DIAGNOSIS — G20 Parkinson's disease: Secondary | ICD-10-CM | POA: Diagnosis not present

## 2018-12-17 DIAGNOSIS — I129 Hypertensive chronic kidney disease with stage 1 through stage 4 chronic kidney disease, or unspecified chronic kidney disease: Secondary | ICD-10-CM | POA: Diagnosis not present

## 2018-12-17 DIAGNOSIS — D649 Anemia, unspecified: Secondary | ICD-10-CM | POA: Diagnosis not present

## 2018-12-17 DIAGNOSIS — E1122 Type 2 diabetes mellitus with diabetic chronic kidney disease: Secondary | ICD-10-CM | POA: Diagnosis not present

## 2018-12-17 DIAGNOSIS — G301 Alzheimer's disease with late onset: Secondary | ICD-10-CM | POA: Diagnosis not present

## 2018-12-17 DIAGNOSIS — F0280 Dementia in other diseases classified elsewhere without behavioral disturbance: Secondary | ICD-10-CM | POA: Diagnosis not present

## 2018-12-18 DIAGNOSIS — E1122 Type 2 diabetes mellitus with diabetic chronic kidney disease: Secondary | ICD-10-CM | POA: Diagnosis not present

## 2018-12-18 DIAGNOSIS — I129 Hypertensive chronic kidney disease with stage 1 through stage 4 chronic kidney disease, or unspecified chronic kidney disease: Secondary | ICD-10-CM | POA: Diagnosis not present

## 2018-12-18 DIAGNOSIS — G20 Parkinson's disease: Secondary | ICD-10-CM | POA: Diagnosis not present

## 2018-12-18 DIAGNOSIS — D649 Anemia, unspecified: Secondary | ICD-10-CM | POA: Diagnosis not present

## 2018-12-18 DIAGNOSIS — G301 Alzheimer's disease with late onset: Secondary | ICD-10-CM | POA: Diagnosis not present

## 2018-12-18 DIAGNOSIS — F0280 Dementia in other diseases classified elsewhere without behavioral disturbance: Secondary | ICD-10-CM | POA: Diagnosis not present

## 2018-12-20 DIAGNOSIS — I129 Hypertensive chronic kidney disease with stage 1 through stage 4 chronic kidney disease, or unspecified chronic kidney disease: Secondary | ICD-10-CM | POA: Diagnosis not present

## 2018-12-20 DIAGNOSIS — D649 Anemia, unspecified: Secondary | ICD-10-CM | POA: Diagnosis not present

## 2018-12-20 DIAGNOSIS — E1122 Type 2 diabetes mellitus with diabetic chronic kidney disease: Secondary | ICD-10-CM | POA: Diagnosis not present

## 2018-12-20 DIAGNOSIS — G20 Parkinson's disease: Secondary | ICD-10-CM | POA: Diagnosis not present

## 2018-12-20 DIAGNOSIS — G301 Alzheimer's disease with late onset: Secondary | ICD-10-CM | POA: Diagnosis not present

## 2018-12-20 DIAGNOSIS — F0280 Dementia in other diseases classified elsewhere without behavioral disturbance: Secondary | ICD-10-CM | POA: Diagnosis not present

## 2018-12-21 DIAGNOSIS — E1122 Type 2 diabetes mellitus with diabetic chronic kidney disease: Secondary | ICD-10-CM | POA: Diagnosis not present

## 2018-12-21 DIAGNOSIS — G301 Alzheimer's disease with late onset: Secondary | ICD-10-CM | POA: Diagnosis not present

## 2018-12-21 DIAGNOSIS — D649 Anemia, unspecified: Secondary | ICD-10-CM | POA: Diagnosis not present

## 2018-12-21 DIAGNOSIS — F0280 Dementia in other diseases classified elsewhere without behavioral disturbance: Secondary | ICD-10-CM | POA: Diagnosis not present

## 2018-12-21 DIAGNOSIS — I129 Hypertensive chronic kidney disease with stage 1 through stage 4 chronic kidney disease, or unspecified chronic kidney disease: Secondary | ICD-10-CM | POA: Diagnosis not present

## 2018-12-21 DIAGNOSIS — G20 Parkinson's disease: Secondary | ICD-10-CM | POA: Diagnosis not present

## 2018-12-22 DIAGNOSIS — G301 Alzheimer's disease with late onset: Secondary | ICD-10-CM | POA: Diagnosis not present

## 2018-12-22 DIAGNOSIS — D649 Anemia, unspecified: Secondary | ICD-10-CM | POA: Diagnosis not present

## 2018-12-22 DIAGNOSIS — G20 Parkinson's disease: Secondary | ICD-10-CM | POA: Diagnosis not present

## 2018-12-22 DIAGNOSIS — F0280 Dementia in other diseases classified elsewhere without behavioral disturbance: Secondary | ICD-10-CM | POA: Diagnosis not present

## 2018-12-22 DIAGNOSIS — E1122 Type 2 diabetes mellitus with diabetic chronic kidney disease: Secondary | ICD-10-CM | POA: Diagnosis not present

## 2018-12-22 DIAGNOSIS — I129 Hypertensive chronic kidney disease with stage 1 through stage 4 chronic kidney disease, or unspecified chronic kidney disease: Secondary | ICD-10-CM | POA: Diagnosis not present

## 2018-12-23 DIAGNOSIS — G20 Parkinson's disease: Secondary | ICD-10-CM | POA: Diagnosis not present

## 2018-12-23 DIAGNOSIS — G301 Alzheimer's disease with late onset: Secondary | ICD-10-CM | POA: Diagnosis not present

## 2018-12-23 DIAGNOSIS — E1122 Type 2 diabetes mellitus with diabetic chronic kidney disease: Secondary | ICD-10-CM | POA: Diagnosis not present

## 2018-12-23 DIAGNOSIS — D649 Anemia, unspecified: Secondary | ICD-10-CM | POA: Diagnosis not present

## 2018-12-23 DIAGNOSIS — F0280 Dementia in other diseases classified elsewhere without behavioral disturbance: Secondary | ICD-10-CM | POA: Diagnosis not present

## 2018-12-23 DIAGNOSIS — I129 Hypertensive chronic kidney disease with stage 1 through stage 4 chronic kidney disease, or unspecified chronic kidney disease: Secondary | ICD-10-CM | POA: Diagnosis not present

## 2018-12-24 DIAGNOSIS — I129 Hypertensive chronic kidney disease with stage 1 through stage 4 chronic kidney disease, or unspecified chronic kidney disease: Secondary | ICD-10-CM | POA: Diagnosis not present

## 2018-12-24 DIAGNOSIS — D649 Anemia, unspecified: Secondary | ICD-10-CM | POA: Diagnosis not present

## 2018-12-24 DIAGNOSIS — G301 Alzheimer's disease with late onset: Secondary | ICD-10-CM | POA: Diagnosis not present

## 2018-12-24 DIAGNOSIS — G20 Parkinson's disease: Secondary | ICD-10-CM | POA: Diagnosis not present

## 2018-12-24 DIAGNOSIS — E1122 Type 2 diabetes mellitus with diabetic chronic kidney disease: Secondary | ICD-10-CM | POA: Diagnosis not present

## 2018-12-24 DIAGNOSIS — F0280 Dementia in other diseases classified elsewhere without behavioral disturbance: Secondary | ICD-10-CM | POA: Diagnosis not present

## 2018-12-25 DIAGNOSIS — G301 Alzheimer's disease with late onset: Secondary | ICD-10-CM | POA: Diagnosis not present

## 2018-12-25 DIAGNOSIS — G20 Parkinson's disease: Secondary | ICD-10-CM | POA: Diagnosis not present

## 2018-12-25 DIAGNOSIS — F0280 Dementia in other diseases classified elsewhere without behavioral disturbance: Secondary | ICD-10-CM | POA: Diagnosis not present

## 2018-12-25 DIAGNOSIS — D649 Anemia, unspecified: Secondary | ICD-10-CM | POA: Diagnosis not present

## 2018-12-25 DIAGNOSIS — E1122 Type 2 diabetes mellitus with diabetic chronic kidney disease: Secondary | ICD-10-CM | POA: Diagnosis not present

## 2018-12-25 DIAGNOSIS — I129 Hypertensive chronic kidney disease with stage 1 through stage 4 chronic kidney disease, or unspecified chronic kidney disease: Secondary | ICD-10-CM | POA: Diagnosis not present

## 2018-12-26 DIAGNOSIS — G301 Alzheimer's disease with late onset: Secondary | ICD-10-CM | POA: Diagnosis not present

## 2018-12-26 DIAGNOSIS — D649 Anemia, unspecified: Secondary | ICD-10-CM | POA: Diagnosis not present

## 2018-12-26 DIAGNOSIS — E1122 Type 2 diabetes mellitus with diabetic chronic kidney disease: Secondary | ICD-10-CM | POA: Diagnosis not present

## 2018-12-26 DIAGNOSIS — I129 Hypertensive chronic kidney disease with stage 1 through stage 4 chronic kidney disease, or unspecified chronic kidney disease: Secondary | ICD-10-CM | POA: Diagnosis not present

## 2018-12-26 DIAGNOSIS — F0280 Dementia in other diseases classified elsewhere without behavioral disturbance: Secondary | ICD-10-CM | POA: Diagnosis not present

## 2018-12-26 DIAGNOSIS — G20 Parkinson's disease: Secondary | ICD-10-CM | POA: Diagnosis not present

## 2018-12-27 DIAGNOSIS — D649 Anemia, unspecified: Secondary | ICD-10-CM | POA: Diagnosis not present

## 2018-12-27 DIAGNOSIS — I129 Hypertensive chronic kidney disease with stage 1 through stage 4 chronic kidney disease, or unspecified chronic kidney disease: Secondary | ICD-10-CM | POA: Diagnosis not present

## 2018-12-27 DIAGNOSIS — G20 Parkinson's disease: Secondary | ICD-10-CM | POA: Diagnosis not present

## 2018-12-27 DIAGNOSIS — G301 Alzheimer's disease with late onset: Secondary | ICD-10-CM | POA: Diagnosis not present

## 2018-12-27 DIAGNOSIS — F0280 Dementia in other diseases classified elsewhere without behavioral disturbance: Secondary | ICD-10-CM | POA: Diagnosis not present

## 2018-12-27 DIAGNOSIS — E1122 Type 2 diabetes mellitus with diabetic chronic kidney disease: Secondary | ICD-10-CM | POA: Diagnosis not present

## 2018-12-28 DIAGNOSIS — G20 Parkinson's disease: Secondary | ICD-10-CM | POA: Diagnosis not present

## 2018-12-28 DIAGNOSIS — F0280 Dementia in other diseases classified elsewhere without behavioral disturbance: Secondary | ICD-10-CM | POA: Diagnosis not present

## 2018-12-28 DIAGNOSIS — I129 Hypertensive chronic kidney disease with stage 1 through stage 4 chronic kidney disease, or unspecified chronic kidney disease: Secondary | ICD-10-CM | POA: Diagnosis not present

## 2018-12-28 DIAGNOSIS — E1122 Type 2 diabetes mellitus with diabetic chronic kidney disease: Secondary | ICD-10-CM | POA: Diagnosis not present

## 2018-12-28 DIAGNOSIS — G301 Alzheimer's disease with late onset: Secondary | ICD-10-CM | POA: Diagnosis not present

## 2018-12-28 DIAGNOSIS — D649 Anemia, unspecified: Secondary | ICD-10-CM | POA: Diagnosis not present

## 2018-12-30 DIAGNOSIS — I129 Hypertensive chronic kidney disease with stage 1 through stage 4 chronic kidney disease, or unspecified chronic kidney disease: Secondary | ICD-10-CM | POA: Diagnosis not present

## 2018-12-30 DIAGNOSIS — E1122 Type 2 diabetes mellitus with diabetic chronic kidney disease: Secondary | ICD-10-CM | POA: Diagnosis not present

## 2018-12-30 DIAGNOSIS — G20 Parkinson's disease: Secondary | ICD-10-CM | POA: Diagnosis not present

## 2018-12-30 DIAGNOSIS — D649 Anemia, unspecified: Secondary | ICD-10-CM | POA: Diagnosis not present

## 2018-12-30 DIAGNOSIS — G301 Alzheimer's disease with late onset: Secondary | ICD-10-CM | POA: Diagnosis not present

## 2018-12-30 DIAGNOSIS — F0280 Dementia in other diseases classified elsewhere without behavioral disturbance: Secondary | ICD-10-CM | POA: Diagnosis not present

## 2018-12-31 DIAGNOSIS — D649 Anemia, unspecified: Secondary | ICD-10-CM | POA: Diagnosis not present

## 2018-12-31 DIAGNOSIS — G20 Parkinson's disease: Secondary | ICD-10-CM | POA: Diagnosis not present

## 2018-12-31 DIAGNOSIS — F0280 Dementia in other diseases classified elsewhere without behavioral disturbance: Secondary | ICD-10-CM | POA: Diagnosis not present

## 2018-12-31 DIAGNOSIS — G301 Alzheimer's disease with late onset: Secondary | ICD-10-CM | POA: Diagnosis not present

## 2018-12-31 DIAGNOSIS — E1122 Type 2 diabetes mellitus with diabetic chronic kidney disease: Secondary | ICD-10-CM | POA: Diagnosis not present

## 2018-12-31 DIAGNOSIS — I129 Hypertensive chronic kidney disease with stage 1 through stage 4 chronic kidney disease, or unspecified chronic kidney disease: Secondary | ICD-10-CM | POA: Diagnosis not present

## 2019-01-01 DIAGNOSIS — G20 Parkinson's disease: Secondary | ICD-10-CM | POA: Diagnosis not present

## 2019-01-01 DIAGNOSIS — E1122 Type 2 diabetes mellitus with diabetic chronic kidney disease: Secondary | ICD-10-CM | POA: Diagnosis not present

## 2019-01-01 DIAGNOSIS — F0280 Dementia in other diseases classified elsewhere without behavioral disturbance: Secondary | ICD-10-CM | POA: Diagnosis not present

## 2019-01-01 DIAGNOSIS — G301 Alzheimer's disease with late onset: Secondary | ICD-10-CM | POA: Diagnosis not present

## 2019-01-01 DIAGNOSIS — D649 Anemia, unspecified: Secondary | ICD-10-CM | POA: Diagnosis not present

## 2019-01-01 DIAGNOSIS — I129 Hypertensive chronic kidney disease with stage 1 through stage 4 chronic kidney disease, or unspecified chronic kidney disease: Secondary | ICD-10-CM | POA: Diagnosis not present

## 2019-01-02 DIAGNOSIS — G301 Alzheimer's disease with late onset: Secondary | ICD-10-CM | POA: Diagnosis not present

## 2019-01-02 DIAGNOSIS — D649 Anemia, unspecified: Secondary | ICD-10-CM | POA: Diagnosis not present

## 2019-01-02 DIAGNOSIS — E1122 Type 2 diabetes mellitus with diabetic chronic kidney disease: Secondary | ICD-10-CM | POA: Diagnosis not present

## 2019-01-02 DIAGNOSIS — F0280 Dementia in other diseases classified elsewhere without behavioral disturbance: Secondary | ICD-10-CM | POA: Diagnosis not present

## 2019-01-02 DIAGNOSIS — I129 Hypertensive chronic kidney disease with stage 1 through stage 4 chronic kidney disease, or unspecified chronic kidney disease: Secondary | ICD-10-CM | POA: Diagnosis not present

## 2019-01-02 DIAGNOSIS — G20 Parkinson's disease: Secondary | ICD-10-CM | POA: Diagnosis not present

## 2019-02-03 DIAGNOSIS — F419 Anxiety disorder, unspecified: Secondary | ICD-10-CM | POA: Diagnosis not present

## 2019-02-03 DIAGNOSIS — E1122 Type 2 diabetes mellitus with diabetic chronic kidney disease: Secondary | ICD-10-CM | POA: Diagnosis not present

## 2019-02-03 DIAGNOSIS — Z8673 Personal history of transient ischemic attack (TIA), and cerebral infarction without residual deficits: Secondary | ICD-10-CM | POA: Diagnosis not present

## 2019-02-03 DIAGNOSIS — D649 Anemia, unspecified: Secondary | ICD-10-CM | POA: Diagnosis not present

## 2019-02-03 DIAGNOSIS — G8929 Other chronic pain: Secondary | ICD-10-CM | POA: Diagnosis not present

## 2019-02-03 DIAGNOSIS — F329 Major depressive disorder, single episode, unspecified: Secondary | ICD-10-CM | POA: Diagnosis not present

## 2019-02-03 DIAGNOSIS — N400 Enlarged prostate without lower urinary tract symptoms: Secondary | ICD-10-CM | POA: Diagnosis not present

## 2019-02-03 DIAGNOSIS — N183 Chronic kidney disease, stage 3 unspecified: Secondary | ICD-10-CM | POA: Diagnosis not present

## 2019-02-03 DIAGNOSIS — Z9181 History of falling: Secondary | ICD-10-CM | POA: Diagnosis not present

## 2019-02-03 DIAGNOSIS — G20 Parkinson's disease: Secondary | ICD-10-CM | POA: Diagnosis not present

## 2019-02-03 DIAGNOSIS — G301 Alzheimer's disease with late onset: Secondary | ICD-10-CM | POA: Diagnosis not present

## 2019-02-03 DIAGNOSIS — F0280 Dementia in other diseases classified elsewhere without behavioral disturbance: Secondary | ICD-10-CM | POA: Diagnosis not present

## 2019-02-03 DIAGNOSIS — I129 Hypertensive chronic kidney disease with stage 1 through stage 4 chronic kidney disease, or unspecified chronic kidney disease: Secondary | ICD-10-CM | POA: Diagnosis not present

## 2019-02-03 DIAGNOSIS — K219 Gastro-esophageal reflux disease without esophagitis: Secondary | ICD-10-CM | POA: Diagnosis not present

## 2019-02-04 DIAGNOSIS — E1122 Type 2 diabetes mellitus with diabetic chronic kidney disease: Secondary | ICD-10-CM | POA: Diagnosis not present

## 2019-02-04 DIAGNOSIS — D649 Anemia, unspecified: Secondary | ICD-10-CM | POA: Diagnosis not present

## 2019-02-04 DIAGNOSIS — G301 Alzheimer's disease with late onset: Secondary | ICD-10-CM | POA: Diagnosis not present

## 2019-02-04 DIAGNOSIS — F0280 Dementia in other diseases classified elsewhere without behavioral disturbance: Secondary | ICD-10-CM | POA: Diagnosis not present

## 2019-02-04 DIAGNOSIS — I129 Hypertensive chronic kidney disease with stage 1 through stage 4 chronic kidney disease, or unspecified chronic kidney disease: Secondary | ICD-10-CM | POA: Diagnosis not present

## 2019-02-04 DIAGNOSIS — G20 Parkinson's disease: Secondary | ICD-10-CM | POA: Diagnosis not present

## 2019-02-05 DIAGNOSIS — E1122 Type 2 diabetes mellitus with diabetic chronic kidney disease: Secondary | ICD-10-CM | POA: Diagnosis not present

## 2019-02-05 DIAGNOSIS — D649 Anemia, unspecified: Secondary | ICD-10-CM | POA: Diagnosis not present

## 2019-02-05 DIAGNOSIS — G20 Parkinson's disease: Secondary | ICD-10-CM | POA: Diagnosis not present

## 2019-02-05 DIAGNOSIS — G301 Alzheimer's disease with late onset: Secondary | ICD-10-CM | POA: Diagnosis not present

## 2019-02-05 DIAGNOSIS — I129 Hypertensive chronic kidney disease with stage 1 through stage 4 chronic kidney disease, or unspecified chronic kidney disease: Secondary | ICD-10-CM | POA: Diagnosis not present

## 2019-02-05 DIAGNOSIS — F0280 Dementia in other diseases classified elsewhere without behavioral disturbance: Secondary | ICD-10-CM | POA: Diagnosis not present

## 2019-02-06 ENCOUNTER — Ambulatory Visit: Payer: Medicare Other | Admitting: Neurology

## 2019-02-06 DIAGNOSIS — G301 Alzheimer's disease with late onset: Secondary | ICD-10-CM | POA: Diagnosis not present

## 2019-02-06 DIAGNOSIS — F0280 Dementia in other diseases classified elsewhere without behavioral disturbance: Secondary | ICD-10-CM | POA: Diagnosis not present

## 2019-02-06 DIAGNOSIS — G20 Parkinson's disease: Secondary | ICD-10-CM | POA: Diagnosis not present

## 2019-02-06 DIAGNOSIS — D649 Anemia, unspecified: Secondary | ICD-10-CM | POA: Diagnosis not present

## 2019-02-06 DIAGNOSIS — E1122 Type 2 diabetes mellitus with diabetic chronic kidney disease: Secondary | ICD-10-CM | POA: Diagnosis not present

## 2019-02-06 DIAGNOSIS — I129 Hypertensive chronic kidney disease with stage 1 through stage 4 chronic kidney disease, or unspecified chronic kidney disease: Secondary | ICD-10-CM | POA: Diagnosis not present

## 2019-02-07 DIAGNOSIS — E1122 Type 2 diabetes mellitus with diabetic chronic kidney disease: Secondary | ICD-10-CM | POA: Diagnosis not present

## 2019-02-07 DIAGNOSIS — F0280 Dementia in other diseases classified elsewhere without behavioral disturbance: Secondary | ICD-10-CM | POA: Diagnosis not present

## 2019-02-07 DIAGNOSIS — G20 Parkinson's disease: Secondary | ICD-10-CM | POA: Diagnosis not present

## 2019-02-07 DIAGNOSIS — D649 Anemia, unspecified: Secondary | ICD-10-CM | POA: Diagnosis not present

## 2019-02-07 DIAGNOSIS — G301 Alzheimer's disease with late onset: Secondary | ICD-10-CM | POA: Diagnosis not present

## 2019-02-07 DIAGNOSIS — I129 Hypertensive chronic kidney disease with stage 1 through stage 4 chronic kidney disease, or unspecified chronic kidney disease: Secondary | ICD-10-CM | POA: Diagnosis not present

## 2019-02-08 DIAGNOSIS — F0280 Dementia in other diseases classified elsewhere without behavioral disturbance: Secondary | ICD-10-CM | POA: Diagnosis not present

## 2019-02-08 DIAGNOSIS — E1122 Type 2 diabetes mellitus with diabetic chronic kidney disease: Secondary | ICD-10-CM | POA: Diagnosis not present

## 2019-02-08 DIAGNOSIS — G301 Alzheimer's disease with late onset: Secondary | ICD-10-CM | POA: Diagnosis not present

## 2019-02-08 DIAGNOSIS — I129 Hypertensive chronic kidney disease with stage 1 through stage 4 chronic kidney disease, or unspecified chronic kidney disease: Secondary | ICD-10-CM | POA: Diagnosis not present

## 2019-02-08 DIAGNOSIS — D649 Anemia, unspecified: Secondary | ICD-10-CM | POA: Diagnosis not present

## 2019-02-08 DIAGNOSIS — G20 Parkinson's disease: Secondary | ICD-10-CM | POA: Diagnosis not present

## 2019-02-09 DIAGNOSIS — E1122 Type 2 diabetes mellitus with diabetic chronic kidney disease: Secondary | ICD-10-CM | POA: Diagnosis not present

## 2019-02-09 DIAGNOSIS — D649 Anemia, unspecified: Secondary | ICD-10-CM | POA: Diagnosis not present

## 2019-02-09 DIAGNOSIS — I129 Hypertensive chronic kidney disease with stage 1 through stage 4 chronic kidney disease, or unspecified chronic kidney disease: Secondary | ICD-10-CM | POA: Diagnosis not present

## 2019-02-09 DIAGNOSIS — F0280 Dementia in other diseases classified elsewhere without behavioral disturbance: Secondary | ICD-10-CM | POA: Diagnosis not present

## 2019-02-09 DIAGNOSIS — G301 Alzheimer's disease with late onset: Secondary | ICD-10-CM | POA: Diagnosis not present

## 2019-02-09 DIAGNOSIS — G20 Parkinson's disease: Secondary | ICD-10-CM | POA: Diagnosis not present

## 2019-02-10 DIAGNOSIS — I129 Hypertensive chronic kidney disease with stage 1 through stage 4 chronic kidney disease, or unspecified chronic kidney disease: Secondary | ICD-10-CM | POA: Diagnosis not present

## 2019-02-10 DIAGNOSIS — E1122 Type 2 diabetes mellitus with diabetic chronic kidney disease: Secondary | ICD-10-CM | POA: Diagnosis not present

## 2019-02-10 DIAGNOSIS — G301 Alzheimer's disease with late onset: Secondary | ICD-10-CM | POA: Diagnosis not present

## 2019-02-10 DIAGNOSIS — D649 Anemia, unspecified: Secondary | ICD-10-CM | POA: Diagnosis not present

## 2019-02-10 DIAGNOSIS — G20 Parkinson's disease: Secondary | ICD-10-CM | POA: Diagnosis not present

## 2019-02-10 DIAGNOSIS — F0280 Dementia in other diseases classified elsewhere without behavioral disturbance: Secondary | ICD-10-CM | POA: Diagnosis not present

## 2019-02-11 DIAGNOSIS — F0280 Dementia in other diseases classified elsewhere without behavioral disturbance: Secondary | ICD-10-CM | POA: Diagnosis not present

## 2019-02-11 DIAGNOSIS — G301 Alzheimer's disease with late onset: Secondary | ICD-10-CM | POA: Diagnosis not present

## 2019-02-11 DIAGNOSIS — I129 Hypertensive chronic kidney disease with stage 1 through stage 4 chronic kidney disease, or unspecified chronic kidney disease: Secondary | ICD-10-CM | POA: Diagnosis not present

## 2019-02-11 DIAGNOSIS — G20 Parkinson's disease: Secondary | ICD-10-CM | POA: Diagnosis not present

## 2019-02-11 DIAGNOSIS — E1122 Type 2 diabetes mellitus with diabetic chronic kidney disease: Secondary | ICD-10-CM | POA: Diagnosis not present

## 2019-02-11 DIAGNOSIS — D649 Anemia, unspecified: Secondary | ICD-10-CM | POA: Diagnosis not present

## 2019-02-12 DIAGNOSIS — G301 Alzheimer's disease with late onset: Secondary | ICD-10-CM | POA: Diagnosis not present

## 2019-02-12 DIAGNOSIS — I129 Hypertensive chronic kidney disease with stage 1 through stage 4 chronic kidney disease, or unspecified chronic kidney disease: Secondary | ICD-10-CM | POA: Diagnosis not present

## 2019-02-12 DIAGNOSIS — D649 Anemia, unspecified: Secondary | ICD-10-CM | POA: Diagnosis not present

## 2019-02-12 DIAGNOSIS — F0280 Dementia in other diseases classified elsewhere without behavioral disturbance: Secondary | ICD-10-CM | POA: Diagnosis not present

## 2019-02-12 DIAGNOSIS — G20 Parkinson's disease: Secondary | ICD-10-CM | POA: Diagnosis not present

## 2019-02-12 DIAGNOSIS — E1122 Type 2 diabetes mellitus with diabetic chronic kidney disease: Secondary | ICD-10-CM | POA: Diagnosis not present

## 2019-02-13 DIAGNOSIS — G20 Parkinson's disease: Secondary | ICD-10-CM | POA: Diagnosis not present

## 2019-02-13 DIAGNOSIS — D649 Anemia, unspecified: Secondary | ICD-10-CM | POA: Diagnosis not present

## 2019-02-13 DIAGNOSIS — F0280 Dementia in other diseases classified elsewhere without behavioral disturbance: Secondary | ICD-10-CM | POA: Diagnosis not present

## 2019-02-13 DIAGNOSIS — I129 Hypertensive chronic kidney disease with stage 1 through stage 4 chronic kidney disease, or unspecified chronic kidney disease: Secondary | ICD-10-CM | POA: Diagnosis not present

## 2019-02-13 DIAGNOSIS — G301 Alzheimer's disease with late onset: Secondary | ICD-10-CM | POA: Diagnosis not present

## 2019-02-13 DIAGNOSIS — E1122 Type 2 diabetes mellitus with diabetic chronic kidney disease: Secondary | ICD-10-CM | POA: Diagnosis not present

## 2019-02-14 DIAGNOSIS — G20 Parkinson's disease: Secondary | ICD-10-CM | POA: Diagnosis not present

## 2019-02-14 DIAGNOSIS — I129 Hypertensive chronic kidney disease with stage 1 through stage 4 chronic kidney disease, or unspecified chronic kidney disease: Secondary | ICD-10-CM | POA: Diagnosis not present

## 2019-02-14 DIAGNOSIS — F0280 Dementia in other diseases classified elsewhere without behavioral disturbance: Secondary | ICD-10-CM | POA: Diagnosis not present

## 2019-02-14 DIAGNOSIS — E1122 Type 2 diabetes mellitus with diabetic chronic kidney disease: Secondary | ICD-10-CM | POA: Diagnosis not present

## 2019-02-14 DIAGNOSIS — G301 Alzheimer's disease with late onset: Secondary | ICD-10-CM | POA: Diagnosis not present

## 2019-02-14 DIAGNOSIS — D649 Anemia, unspecified: Secondary | ICD-10-CM | POA: Diagnosis not present

## 2019-02-15 DIAGNOSIS — G301 Alzheimer's disease with late onset: Secondary | ICD-10-CM | POA: Diagnosis not present

## 2019-02-15 DIAGNOSIS — F0280 Dementia in other diseases classified elsewhere without behavioral disturbance: Secondary | ICD-10-CM | POA: Diagnosis not present

## 2019-02-15 DIAGNOSIS — D649 Anemia, unspecified: Secondary | ICD-10-CM | POA: Diagnosis not present

## 2019-02-15 DIAGNOSIS — G20 Parkinson's disease: Secondary | ICD-10-CM | POA: Diagnosis not present

## 2019-02-15 DIAGNOSIS — I129 Hypertensive chronic kidney disease with stage 1 through stage 4 chronic kidney disease, or unspecified chronic kidney disease: Secondary | ICD-10-CM | POA: Diagnosis not present

## 2019-02-15 DIAGNOSIS — E1122 Type 2 diabetes mellitus with diabetic chronic kidney disease: Secondary | ICD-10-CM | POA: Diagnosis not present

## 2019-02-16 DIAGNOSIS — F0280 Dementia in other diseases classified elsewhere without behavioral disturbance: Secondary | ICD-10-CM | POA: Diagnosis not present

## 2019-02-16 DIAGNOSIS — G20 Parkinson's disease: Secondary | ICD-10-CM | POA: Diagnosis not present

## 2019-02-16 DIAGNOSIS — E1122 Type 2 diabetes mellitus with diabetic chronic kidney disease: Secondary | ICD-10-CM | POA: Diagnosis not present

## 2019-02-16 DIAGNOSIS — I129 Hypertensive chronic kidney disease with stage 1 through stage 4 chronic kidney disease, or unspecified chronic kidney disease: Secondary | ICD-10-CM | POA: Diagnosis not present

## 2019-02-16 DIAGNOSIS — G301 Alzheimer's disease with late onset: Secondary | ICD-10-CM | POA: Diagnosis not present

## 2019-02-16 DIAGNOSIS — D649 Anemia, unspecified: Secondary | ICD-10-CM | POA: Diagnosis not present

## 2019-02-17 DIAGNOSIS — G20 Parkinson's disease: Secondary | ICD-10-CM | POA: Diagnosis not present

## 2019-02-17 DIAGNOSIS — F0280 Dementia in other diseases classified elsewhere without behavioral disturbance: Secondary | ICD-10-CM | POA: Diagnosis not present

## 2019-02-17 DIAGNOSIS — D649 Anemia, unspecified: Secondary | ICD-10-CM | POA: Diagnosis not present

## 2019-02-17 DIAGNOSIS — E1122 Type 2 diabetes mellitus with diabetic chronic kidney disease: Secondary | ICD-10-CM | POA: Diagnosis not present

## 2019-02-17 DIAGNOSIS — I129 Hypertensive chronic kidney disease with stage 1 through stage 4 chronic kidney disease, or unspecified chronic kidney disease: Secondary | ICD-10-CM | POA: Diagnosis not present

## 2019-02-17 DIAGNOSIS — G301 Alzheimer's disease with late onset: Secondary | ICD-10-CM | POA: Diagnosis not present

## 2019-02-18 DIAGNOSIS — G301 Alzheimer's disease with late onset: Secondary | ICD-10-CM | POA: Diagnosis not present

## 2019-02-18 DIAGNOSIS — G20 Parkinson's disease: Secondary | ICD-10-CM | POA: Diagnosis not present

## 2019-02-18 DIAGNOSIS — E1122 Type 2 diabetes mellitus with diabetic chronic kidney disease: Secondary | ICD-10-CM | POA: Diagnosis not present

## 2019-02-18 DIAGNOSIS — F0280 Dementia in other diseases classified elsewhere without behavioral disturbance: Secondary | ICD-10-CM | POA: Diagnosis not present

## 2019-02-18 DIAGNOSIS — D649 Anemia, unspecified: Secondary | ICD-10-CM | POA: Diagnosis not present

## 2019-02-18 DIAGNOSIS — I129 Hypertensive chronic kidney disease with stage 1 through stage 4 chronic kidney disease, or unspecified chronic kidney disease: Secondary | ICD-10-CM | POA: Diagnosis not present

## 2019-02-19 DIAGNOSIS — D649 Anemia, unspecified: Secondary | ICD-10-CM | POA: Diagnosis not present

## 2019-02-19 DIAGNOSIS — G301 Alzheimer's disease with late onset: Secondary | ICD-10-CM | POA: Diagnosis not present

## 2019-02-19 DIAGNOSIS — I129 Hypertensive chronic kidney disease with stage 1 through stage 4 chronic kidney disease, or unspecified chronic kidney disease: Secondary | ICD-10-CM | POA: Diagnosis not present

## 2019-02-19 DIAGNOSIS — E1122 Type 2 diabetes mellitus with diabetic chronic kidney disease: Secondary | ICD-10-CM | POA: Diagnosis not present

## 2019-02-19 DIAGNOSIS — F0280 Dementia in other diseases classified elsewhere without behavioral disturbance: Secondary | ICD-10-CM | POA: Diagnosis not present

## 2019-02-19 DIAGNOSIS — G20 Parkinson's disease: Secondary | ICD-10-CM | POA: Diagnosis not present

## 2019-02-20 DIAGNOSIS — D649 Anemia, unspecified: Secondary | ICD-10-CM | POA: Diagnosis not present

## 2019-02-20 DIAGNOSIS — G301 Alzheimer's disease with late onset: Secondary | ICD-10-CM | POA: Diagnosis not present

## 2019-02-20 DIAGNOSIS — G20 Parkinson's disease: Secondary | ICD-10-CM | POA: Diagnosis not present

## 2019-02-20 DIAGNOSIS — F0280 Dementia in other diseases classified elsewhere without behavioral disturbance: Secondary | ICD-10-CM | POA: Diagnosis not present

## 2019-02-20 DIAGNOSIS — I129 Hypertensive chronic kidney disease with stage 1 through stage 4 chronic kidney disease, or unspecified chronic kidney disease: Secondary | ICD-10-CM | POA: Diagnosis not present

## 2019-02-20 DIAGNOSIS — E1122 Type 2 diabetes mellitus with diabetic chronic kidney disease: Secondary | ICD-10-CM | POA: Diagnosis not present

## 2019-02-21 DIAGNOSIS — I129 Hypertensive chronic kidney disease with stage 1 through stage 4 chronic kidney disease, or unspecified chronic kidney disease: Secondary | ICD-10-CM | POA: Diagnosis not present

## 2019-02-21 DIAGNOSIS — G301 Alzheimer's disease with late onset: Secondary | ICD-10-CM | POA: Diagnosis not present

## 2019-02-21 DIAGNOSIS — E1122 Type 2 diabetes mellitus with diabetic chronic kidney disease: Secondary | ICD-10-CM | POA: Diagnosis not present

## 2019-02-21 DIAGNOSIS — G20 Parkinson's disease: Secondary | ICD-10-CM | POA: Diagnosis not present

## 2019-02-21 DIAGNOSIS — D649 Anemia, unspecified: Secondary | ICD-10-CM | POA: Diagnosis not present

## 2019-02-21 DIAGNOSIS — F0280 Dementia in other diseases classified elsewhere without behavioral disturbance: Secondary | ICD-10-CM | POA: Diagnosis not present

## 2019-02-22 DIAGNOSIS — I129 Hypertensive chronic kidney disease with stage 1 through stage 4 chronic kidney disease, or unspecified chronic kidney disease: Secondary | ICD-10-CM | POA: Diagnosis not present

## 2019-02-22 DIAGNOSIS — D649 Anemia, unspecified: Secondary | ICD-10-CM | POA: Diagnosis not present

## 2019-02-22 DIAGNOSIS — G20 Parkinson's disease: Secondary | ICD-10-CM | POA: Diagnosis not present

## 2019-02-22 DIAGNOSIS — F0280 Dementia in other diseases classified elsewhere without behavioral disturbance: Secondary | ICD-10-CM | POA: Diagnosis not present

## 2019-02-22 DIAGNOSIS — G301 Alzheimer's disease with late onset: Secondary | ICD-10-CM | POA: Diagnosis not present

## 2019-02-22 DIAGNOSIS — E1122 Type 2 diabetes mellitus with diabetic chronic kidney disease: Secondary | ICD-10-CM | POA: Diagnosis not present

## 2019-02-23 DIAGNOSIS — G20 Parkinson's disease: Secondary | ICD-10-CM | POA: Diagnosis not present

## 2019-02-23 DIAGNOSIS — D649 Anemia, unspecified: Secondary | ICD-10-CM | POA: Diagnosis not present

## 2019-02-23 DIAGNOSIS — F0280 Dementia in other diseases classified elsewhere without behavioral disturbance: Secondary | ICD-10-CM | POA: Diagnosis not present

## 2019-02-23 DIAGNOSIS — E1122 Type 2 diabetes mellitus with diabetic chronic kidney disease: Secondary | ICD-10-CM | POA: Diagnosis not present

## 2019-02-23 DIAGNOSIS — G301 Alzheimer's disease with late onset: Secondary | ICD-10-CM | POA: Diagnosis not present

## 2019-02-23 DIAGNOSIS — I129 Hypertensive chronic kidney disease with stage 1 through stage 4 chronic kidney disease, or unspecified chronic kidney disease: Secondary | ICD-10-CM | POA: Diagnosis not present

## 2019-02-24 DIAGNOSIS — G20 Parkinson's disease: Secondary | ICD-10-CM | POA: Diagnosis not present

## 2019-02-24 DIAGNOSIS — G301 Alzheimer's disease with late onset: Secondary | ICD-10-CM | POA: Diagnosis not present

## 2019-02-24 DIAGNOSIS — F0280 Dementia in other diseases classified elsewhere without behavioral disturbance: Secondary | ICD-10-CM | POA: Diagnosis not present

## 2019-02-24 DIAGNOSIS — I129 Hypertensive chronic kidney disease with stage 1 through stage 4 chronic kidney disease, or unspecified chronic kidney disease: Secondary | ICD-10-CM | POA: Diagnosis not present

## 2019-02-24 DIAGNOSIS — D649 Anemia, unspecified: Secondary | ICD-10-CM | POA: Diagnosis not present

## 2019-02-24 DIAGNOSIS — E1122 Type 2 diabetes mellitus with diabetic chronic kidney disease: Secondary | ICD-10-CM | POA: Diagnosis not present

## 2019-02-25 DIAGNOSIS — F0280 Dementia in other diseases classified elsewhere without behavioral disturbance: Secondary | ICD-10-CM | POA: Diagnosis not present

## 2019-02-25 DIAGNOSIS — I129 Hypertensive chronic kidney disease with stage 1 through stage 4 chronic kidney disease, or unspecified chronic kidney disease: Secondary | ICD-10-CM | POA: Diagnosis not present

## 2019-02-25 DIAGNOSIS — E1122 Type 2 diabetes mellitus with diabetic chronic kidney disease: Secondary | ICD-10-CM | POA: Diagnosis not present

## 2019-02-25 DIAGNOSIS — G301 Alzheimer's disease with late onset: Secondary | ICD-10-CM | POA: Diagnosis not present

## 2019-02-25 DIAGNOSIS — G20 Parkinson's disease: Secondary | ICD-10-CM | POA: Diagnosis not present

## 2019-02-25 DIAGNOSIS — D649 Anemia, unspecified: Secondary | ICD-10-CM | POA: Diagnosis not present

## 2019-02-26 DIAGNOSIS — D649 Anemia, unspecified: Secondary | ICD-10-CM | POA: Diagnosis not present

## 2019-02-26 DIAGNOSIS — E1122 Type 2 diabetes mellitus with diabetic chronic kidney disease: Secondary | ICD-10-CM | POA: Diagnosis not present

## 2019-02-26 DIAGNOSIS — F0280 Dementia in other diseases classified elsewhere without behavioral disturbance: Secondary | ICD-10-CM | POA: Diagnosis not present

## 2019-02-26 DIAGNOSIS — G20 Parkinson's disease: Secondary | ICD-10-CM | POA: Diagnosis not present

## 2019-02-26 DIAGNOSIS — G301 Alzheimer's disease with late onset: Secondary | ICD-10-CM | POA: Diagnosis not present

## 2019-02-26 DIAGNOSIS — I129 Hypertensive chronic kidney disease with stage 1 through stage 4 chronic kidney disease, or unspecified chronic kidney disease: Secondary | ICD-10-CM | POA: Diagnosis not present

## 2019-02-27 DIAGNOSIS — I129 Hypertensive chronic kidney disease with stage 1 through stage 4 chronic kidney disease, or unspecified chronic kidney disease: Secondary | ICD-10-CM | POA: Diagnosis not present

## 2019-02-27 DIAGNOSIS — F0280 Dementia in other diseases classified elsewhere without behavioral disturbance: Secondary | ICD-10-CM | POA: Diagnosis not present

## 2019-02-27 DIAGNOSIS — E1122 Type 2 diabetes mellitus with diabetic chronic kidney disease: Secondary | ICD-10-CM | POA: Diagnosis not present

## 2019-02-27 DIAGNOSIS — G301 Alzheimer's disease with late onset: Secondary | ICD-10-CM | POA: Diagnosis not present

## 2019-02-27 DIAGNOSIS — D649 Anemia, unspecified: Secondary | ICD-10-CM | POA: Diagnosis not present

## 2019-02-27 DIAGNOSIS — G20 Parkinson's disease: Secondary | ICD-10-CM | POA: Diagnosis not present

## 2019-02-28 DIAGNOSIS — G301 Alzheimer's disease with late onset: Secondary | ICD-10-CM | POA: Diagnosis not present

## 2019-02-28 DIAGNOSIS — D649 Anemia, unspecified: Secondary | ICD-10-CM | POA: Diagnosis not present

## 2019-02-28 DIAGNOSIS — F0280 Dementia in other diseases classified elsewhere without behavioral disturbance: Secondary | ICD-10-CM | POA: Diagnosis not present

## 2019-02-28 DIAGNOSIS — I129 Hypertensive chronic kidney disease with stage 1 through stage 4 chronic kidney disease, or unspecified chronic kidney disease: Secondary | ICD-10-CM | POA: Diagnosis not present

## 2019-02-28 DIAGNOSIS — G20 Parkinson's disease: Secondary | ICD-10-CM | POA: Diagnosis not present

## 2019-02-28 DIAGNOSIS — E1122 Type 2 diabetes mellitus with diabetic chronic kidney disease: Secondary | ICD-10-CM | POA: Diagnosis not present

## 2019-03-01 DIAGNOSIS — G20 Parkinson's disease: Secondary | ICD-10-CM | POA: Diagnosis not present

## 2019-03-01 DIAGNOSIS — F0280 Dementia in other diseases classified elsewhere without behavioral disturbance: Secondary | ICD-10-CM | POA: Diagnosis not present

## 2019-03-01 DIAGNOSIS — G301 Alzheimer's disease with late onset: Secondary | ICD-10-CM | POA: Diagnosis not present

## 2019-03-01 DIAGNOSIS — D649 Anemia, unspecified: Secondary | ICD-10-CM | POA: Diagnosis not present

## 2019-03-01 DIAGNOSIS — I129 Hypertensive chronic kidney disease with stage 1 through stage 4 chronic kidney disease, or unspecified chronic kidney disease: Secondary | ICD-10-CM | POA: Diagnosis not present

## 2019-03-01 DIAGNOSIS — E1122 Type 2 diabetes mellitus with diabetic chronic kidney disease: Secondary | ICD-10-CM | POA: Diagnosis not present

## 2019-03-02 DIAGNOSIS — I129 Hypertensive chronic kidney disease with stage 1 through stage 4 chronic kidney disease, or unspecified chronic kidney disease: Secondary | ICD-10-CM | POA: Diagnosis not present

## 2019-03-02 DIAGNOSIS — F0280 Dementia in other diseases classified elsewhere without behavioral disturbance: Secondary | ICD-10-CM | POA: Diagnosis not present

## 2019-03-02 DIAGNOSIS — D649 Anemia, unspecified: Secondary | ICD-10-CM | POA: Diagnosis not present

## 2019-03-02 DIAGNOSIS — G301 Alzheimer's disease with late onset: Secondary | ICD-10-CM | POA: Diagnosis not present

## 2019-03-02 DIAGNOSIS — E1122 Type 2 diabetes mellitus with diabetic chronic kidney disease: Secondary | ICD-10-CM | POA: Diagnosis not present

## 2019-03-02 DIAGNOSIS — G20 Parkinson's disease: Secondary | ICD-10-CM | POA: Diagnosis not present

## 2019-03-06 DEATH — deceased

## 2020-03-05 DEATH — deceased
